# Patient Record
Sex: Male | Born: 1950 | Race: White | Hispanic: No | Marital: Married | Smoking: Never smoker
Health system: Southern US, Community
[De-identification: ages and names within clinical notes are randomized; demographics above are authoritative.]

## PROBLEM LIST (undated history)

## (undated) DIAGNOSIS — N50819 Testicular pain, unspecified: Secondary | ICD-10-CM

## (undated) DIAGNOSIS — I700 Atherosclerosis of aorta: Secondary | ICD-10-CM

## (undated) DIAGNOSIS — N529 Male erectile dysfunction, unspecified: Secondary | ICD-10-CM

## (undated) DIAGNOSIS — I779 Disorder of arteries and arterioles, unspecified: Secondary | ICD-10-CM

## (undated) DIAGNOSIS — L719 Rosacea, unspecified: Secondary | ICD-10-CM

## (undated) DIAGNOSIS — I2510 Atherosclerotic heart disease of native coronary artery without angina pectoris: Secondary | ICD-10-CM

## (undated) DIAGNOSIS — C679 Malignant neoplasm of bladder, unspecified: Secondary | ICD-10-CM

## (undated) DIAGNOSIS — R55 Syncope and collapse: Secondary | ICD-10-CM

## (undated) DIAGNOSIS — I4891 Unspecified atrial fibrillation: Secondary | ICD-10-CM

## (undated) DIAGNOSIS — I10 Essential (primary) hypertension: Secondary | ICD-10-CM

## (undated) DIAGNOSIS — E7800 Pure hypercholesterolemia, unspecified: Secondary | ICD-10-CM

## (undated) DIAGNOSIS — N400 Enlarged prostate without lower urinary tract symptoms: Secondary | ICD-10-CM

## (undated) DIAGNOSIS — I240 Acute coronary thrombosis not resulting in myocardial infarction: Secondary | ICD-10-CM

## (undated) DIAGNOSIS — Z8719 Personal history of other diseases of the digestive system: Secondary | ICD-10-CM

## (undated) DIAGNOSIS — C662 Malignant neoplasm of left ureter: Secondary | ICD-10-CM

## (undated) DIAGNOSIS — Z87442 Personal history of urinary calculi: Secondary | ICD-10-CM

## (undated) DIAGNOSIS — I4892 Unspecified atrial flutter: Secondary | ICD-10-CM

## (undated) DIAGNOSIS — G4733 Obstructive sleep apnea (adult) (pediatric): Secondary | ICD-10-CM

## (undated) DIAGNOSIS — R251 Tremor, unspecified: Secondary | ICD-10-CM

## (undated) DIAGNOSIS — C801 Malignant (primary) neoplasm, unspecified: Secondary | ICD-10-CM

## (undated) DIAGNOSIS — J3089 Other allergic rhinitis: Secondary | ICD-10-CM

## (undated) DIAGNOSIS — R001 Bradycardia, unspecified: Secondary | ICD-10-CM

## (undated) DIAGNOSIS — Z7982 Long term (current) use of aspirin: Secondary | ICD-10-CM

## (undated) DIAGNOSIS — G4730 Sleep apnea, unspecified: Secondary | ICD-10-CM

## (undated) DIAGNOSIS — I499 Cardiac arrhythmia, unspecified: Secondary | ICD-10-CM

## (undated) DIAGNOSIS — Z9889 Other specified postprocedural states: Secondary | ICD-10-CM

## (undated) HISTORY — PX: HERNIA REPAIR: SHX51

## (undated) HISTORY — DX: Testicular pain, unspecified: N50.819

## (undated) HISTORY — PX: VASECTOMY: SHX75

## (undated) HISTORY — DX: Sleep apnea, unspecified: G47.30

## (undated) HISTORY — PX: OTHER SURGICAL HISTORY: SHX169

## (undated) HISTORY — DX: Unspecified atrial fibrillation: I48.91

## (undated) HISTORY — PX: CARDIAC CATHETERIZATION: SHX172

## (undated) HISTORY — PX: COLONOSCOPY: SHX174

## (undated) HISTORY — PX: CARDIOVERSION: EP1203

## (undated) HISTORY — PX: TONSILLECTOMY: SUR1361

## (undated) HISTORY — PX: APPENDECTOMY: SHX54

## (undated) HISTORY — PX: FACIAL RECONSTRUCTION SURGERY: SHX631

---
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
## 1993-01-13 HISTORY — PX: INGUINAL HERNIA REPAIR: SHX194

## 2008-05-15 ENCOUNTER — Ambulatory Visit: Payer: Self-pay | Admitting: Gastroenterology

## 2009-06-20 ENCOUNTER — Ambulatory Visit: Payer: Self-pay | Admitting: Cardiology

## 2009-06-20 HISTORY — PX: CARDIOVERSION: EP1203

## 2010-02-13 ENCOUNTER — Ambulatory Visit: Payer: Self-pay | Admitting: Cardiology

## 2010-02-13 HISTORY — PX: LEFT HEART CATH AND CORONARY ANGIOGRAPHY: CATH118249

## 2012-01-14 HISTORY — PX: AV NODE ABLATION: SHX1209

## 2012-11-01 ENCOUNTER — Ambulatory Visit: Payer: Self-pay | Admitting: Cardiology

## 2012-11-18 DIAGNOSIS — I240 Acute coronary thrombosis not resulting in myocardial infarction: Secondary | ICD-10-CM | POA: Insufficient documentation

## 2012-11-18 DIAGNOSIS — I4892 Unspecified atrial flutter: Secondary | ICD-10-CM | POA: Insufficient documentation

## 2012-11-19 DIAGNOSIS — I4891 Unspecified atrial fibrillation: Secondary | ICD-10-CM | POA: Insufficient documentation

## 2012-11-19 DIAGNOSIS — I2510 Atherosclerotic heart disease of native coronary artery without angina pectoris: Secondary | ICD-10-CM | POA: Insufficient documentation

## 2013-01-07 HISTORY — PX: CARDIAC ELECTROPHYSIOLOGY STUDY AND ABLATION: SHX1294

## 2013-11-30 DIAGNOSIS — Z8679 Personal history of other diseases of the circulatory system: Secondary | ICD-10-CM | POA: Insufficient documentation

## 2013-11-30 DIAGNOSIS — Z9889 Other specified postprocedural states: Secondary | ICD-10-CM

## 2014-05-05 NOTE — Op Note (Signed)
PATIENT NAME:  [name], CROSSAN MR#:  [id number] DATE OF BIRTH:  [birth year]  DATE OF PROCEDURE:  11/01/2012  PRIMARY CARE PHYSICIAN: Dr. Raechel Ache.  DIAGNOSIS: Atrial flutter.   PROCEDURE: Cardioversion.   INDICATION: The patient is a 64 year old gentleman with paroxysmal atrial fibrillation and flutter with recent history of tachycardia noted to be in atrial flutter and is currently anticoagulated on Pradaxa.  PROCEDURE: The patient was brought to the special holding area procedure in a fasting state. The patient was given 2 mg of intravenous Versed  and 70 mg of propofol. Cardioversion was performed with 50 joules, which converted to atrial fibrillation and 100 joules, which converted to sinus rhythm. The patient tolerated the procedure well without complication.   ____________________________ [name], MD ap:aw D: 11/01/2012 08:15:29 ET T: 11/01/2012 08:32:14 ET JOB#: [id number]  cc: [name], MD, <Dictator> [name] MD ELECTRONICALLY SIGNED 12/01/2012 9:31

## 2015-01-14 DIAGNOSIS — Z87442 Personal history of urinary calculi: Secondary | ICD-10-CM

## 2015-01-14 HISTORY — DX: Personal history of urinary calculi: Z87.442

## 2015-09-19 ENCOUNTER — Ambulatory Visit (INDEPENDENT_AMBULATORY_CARE_PROVIDER_SITE_OTHER): Payer: PRIVATE HEALTH INSURANCE | Admitting: Urology

## 2015-09-19 ENCOUNTER — Encounter: Payer: Self-pay | Admitting: Urology

## 2015-09-19 VITALS — BP 117/72 | HR 61 | Ht 67.0 in | Wt 195.8 lb

## 2015-09-19 DIAGNOSIS — N451 Epididymitis: Secondary | ICD-10-CM | POA: Diagnosis not present

## 2015-09-19 DIAGNOSIS — N401 Enlarged prostate with lower urinary tract symptoms: Secondary | ICD-10-CM

## 2015-09-19 DIAGNOSIS — N138 Other obstructive and reflux uropathy: Secondary | ICD-10-CM

## 2015-09-19 NOTE — Progress Notes (Signed)
09/19/2015 10:13 PM   [name] 1950/08/05 [id number]  Referring provider: Ezequiel Kayser, MD [facility] [place], [place] [address]  Chief Complaint  Patient presents with  . Testicle Pain    Left referred by Dr. Dorthula Perfect at [facility]    HPI: Patient is a 65 year old Caucasian male who is referred by Dr. Dorthula Perfect for left testicular pain.  He states that his symptoms began four days ago with a gradual onset.  It began in the left groin and radiated into the left scrotum.  As the days progressed, the pain and swelling worsened.  He did not have fevers, chills, nausea or vomiting associated with the scrotal issue.  He did have fevers and chills the previous weekend.  He did not have any trauma to the area.    He has constipation.    He was seen by his PCP, yesterday, and started on Cipro 500 mg for two weeks.  His UA yesterday was positive for 4-10 RBC's/hpf.  An urine culture is pending at this time.    He did not admit to sexual indiscretions, but he did present with his wife.  Of interest, his wife did request that he be screened for STI's at the end of the visit.  This was asked with the patient present and he agreed to the testing.    He did not admit to penile discharge or urethral burning.  He has been having urinary frequency for over the last year.  It has become especially worse over the last month.  He has also been experiencing urgency and nocturia 4 to 5.  He does not have feelings of incomplete bladder emptying.    His states he has PSA screening through his employer.  He does not recall the value of his PSA's at this time.    He had seen an urologist in the remote past for a vasectomy.  He also remembers having a cystoscopy, but he does not recall the reason this was performed.  He does remember he was told everything "looked good."   PMH: Past Medical History:  Diagnosis Date  . Atrial fibrillation ([place])   . Heart disease   . Sleep  apnea   . Testicle pain     Surgical History: Past Surgical History:  Procedure Laterality Date  . APPENDECTOMY    . heart ablation    . HERNIA REPAIR    . VASECTOMY      Home Medications:    Medication List       Accurate as of 09/19/15 10:13 PM. Always use your most recent med list.          aspirin EC 81 MG tablet Take by mouth.   ciprofloxacin 500 MG tablet Commonly known as:  CIPRO Take by mouth.   fexofenadine 180 MG tablet Commonly known as:  ALLEGRA Take by mouth.   HYDROcodone-acetaminophen 5-325 MG tablet Commonly known as:  NORCO/VICODIN Take by mouth.   Loratadine 10 MG Caps Take by mouth.   lovastatin 20 MG tablet Commonly known as:  MEVACOR TAKE TWO TABLETS BY MOUTH ONCE DAILY FOR CHOLESTEROL   metoprolol tartrate 25 MG tablet Commonly known as:  LOPRESSOR Take by mouth.   MULTI-VITAMINS Tabs Take by mouth.       Allergies: No Known Allergies  Family History: Family History  Problem Relation Age of Onset  . Nephrotic syndrome Son   . Prostate cancer Neg Hx  Social History:  reports that he has never smoked. His smokeless tobacco use includes Chew. He reports that he drinks alcohol. He reports that he does not use drugs.  ROS: UROLOGY Frequent Urination?: Yes Hard to postpone urination?: Yes Burning/pain with urination?: No Get up at night to urinate?: Yes Leakage of urine?: No Urine stream starts and stops?: No Trouble starting stream?: No Do you have to strain to urinate?: No Blood in urine?: No Urinary tract infection?: No Sexually transmitted disease?: No Injury to kidneys or bladder?: No Painful intercourse?: No Weak stream?: No Erection problems?: Yes Penile pain?: No  Gastrointestinal Nausea?: No Vomiting?: No Indigestion/heartburn?: No Diarrhea?: No Constipation?: Yes  Constitutional Fever: Yes Night sweats?: Yes Weight loss?: Yes Fatigue?: Yes  Skin Skin rash/lesions?: Yes Itching?:  Yes  Eyes Blurred vision?: Yes Double vision?: No  Ears/Nose/Throat Sore throat?: No Sinus problems?: Yes  Hematologic/Lymphatic Swollen glands?: No Easy bruising?: Yes  Cardiovascular Leg swelling?: Yes Chest pain?: No  Respiratory Cough?: Yes Shortness of breath?: Yes  Endocrine Excessive thirst?: No  Musculoskeletal Back pain?: No Joint pain?: No  Neurological Headaches?: No Dizziness?: No  Psychologic Depression?: No Anxiety?: No  Physical Exam: BP 117/72   Pulse 61   Ht 5\' 7"  (1.702 m)   Wt 195 lb 12.8 oz (88.8 kg)   BMI 30.67 kg/m   Constitutional: Well nourished. Alert and oriented, No acute distress. HEENT: [place] AT, moist mucus membranes. Trachea midline, no masses. Cardiovascular: No clubbing, cyanosis, or edema. Respiratory: Normal respiratory effort, no increased work of breathing. GI: Abdomen is soft, non tender, non distended, no abdominal masses. Liver and spleen not palpable.  No hernias appreciated.  Stool sample for occult testing is not indicated.   GU: No CVA tenderness.  No bladder fullness or masses.  Patient with circumcised phallus.  Urethral meatus is patent.  No penile discharge. No penile lesions or rashes. Scrotum without lesions, cysts, rashes and/or edema.  Testicles are located scrotally bilaterally. No masses are appreciated in the testicles. Right epididymis is normal.  Left epidiymis is indurated and tender.   Rectal: Deferred Skin: No rashes, bruises or suspicious lesions. Lymph: No cervical or inguinal adenopathy. Neurologic: Grossly intact, no focal deficits, moving all 4 extremities. Psychiatric: Normal mood and affect.   Assessment & Plan:    1. Left epididymitis  - continue the Cipro  - will contact the patient with urine culture results when available   - RTC in 2 weeks for exam and symptom recheck  2. BPH with LUTS  - patient will bring copies of his previous PSA's  - when patient returns in 2 weeks will  contact a prostate exam  - will obtain IPSS score at his next visit   Return in about 2 weeks (around 10/03/2015) for exam, IPSS and synptom recheck.  These notes generated with voice recognition software. I apologize for typographical errors.  Zara Council, [facility] [address] [place],  [address] [phone]

## 2015-09-20 ENCOUNTER — Telehealth: Payer: Self-pay | Admitting: Urology

## 2015-09-20 ENCOUNTER — Other Ambulatory Visit: Payer: PRIVATE HEALTH INSURANCE

## 2015-09-20 DIAGNOSIS — N401 Enlarged prostate with lower urinary tract symptoms: Principal | ICD-10-CM

## 2015-09-20 DIAGNOSIS — N138 Other obstructive and reflux uropathy: Secondary | ICD-10-CM

## 2015-09-20 NOTE — Telephone Encounter (Signed)
Orders placed for gc/chlamydia per Larene Beach.

## 2015-09-20 NOTE — Telephone Encounter (Signed)
Pt's wife, Malachy Mood, brought urine sample in today.  Pt had appt w/Shannon yesterday.  She would like for you to call her with results.

## 2015-09-21 ENCOUNTER — Telehealth: Payer: Self-pay | Admitting: Urology

## 2015-09-21 NOTE — Telephone Encounter (Signed)
Would you call the patient and/or his wife and let them know that the cultures for GC/Chlamydia are not available yet?  I will continue to look for them during the weekend and if I need to switch the antibiotic, I will send it to the pharmacy.  His MyChart is still pending.  If they get that set up, I can send them a message with the culture results and they can see them for themselves.

## 2015-09-21 NOTE — Progress Notes (Signed)
Would you call the patient and/or his wife and let them know that the cultures for GC/Chlamydia are not available yet?  I will continue to look for them during the weekend and if I need to switch the antibiotic, I will send it to the pharmacy.  His MyChart is still pending.  If they get that set up, I can send them a message with the culture results and they can see them for themselves.

## 2015-09-21 NOTE — Telephone Encounter (Signed)
Pt called office to find out test results.  I read message to him and he understands.  I also advised him to set up MyChart and confirmed e-mail and told him to follow directions with code to activate.

## 2015-09-22 LAB — GC/CHLAMYDIA PROBE AMP
CHLAMYDIA, DNA PROBE: NEGATIVE
Neisseria gonorrhoeae by PCR: NEGATIVE

## 2015-09-24 ENCOUNTER — Telehealth: Payer: Self-pay | Admitting: Family Medicine

## 2015-09-24 NOTE — Telephone Encounter (Signed)
Patient notified. I spoke to Lab at [facility], they did not order a urine culture on patient. They only did a urinalysis, this can be seen in Care everywhere.

## 2015-09-24 NOTE — Telephone Encounter (Signed)
-----   Message from Nori Riis, PA-C sent at 09/22/2015  6:27 PM EDT ----- Please notify the patient that his chlamydia and gonorrhea cultures were negative.  I have not seen the results of the urine culture from Dr. Dorthula Perfect' office.  Would you call and see if we can get those results?

## 2015-10-04 ENCOUNTER — Ambulatory Visit: Payer: Self-pay | Admitting: Urology

## 2015-10-04 ENCOUNTER — Ambulatory Visit (INDEPENDENT_AMBULATORY_CARE_PROVIDER_SITE_OTHER): Payer: PRIVATE HEALTH INSURANCE | Admitting: Urology

## 2015-10-04 ENCOUNTER — Encounter: Payer: Self-pay | Admitting: Urology

## 2015-10-04 VITALS — BP 110/64 | HR 56 | Ht 70.0 in | Wt 197.8 lb

## 2015-10-04 DIAGNOSIS — N451 Epididymitis: Secondary | ICD-10-CM | POA: Diagnosis not present

## 2015-10-04 DIAGNOSIS — N401 Enlarged prostate with lower urinary tract symptoms: Secondary | ICD-10-CM

## 2015-10-04 DIAGNOSIS — N138 Other obstructive and reflux uropathy: Secondary | ICD-10-CM

## 2015-10-04 DIAGNOSIS — R3129 Other microscopic hematuria: Secondary | ICD-10-CM

## 2015-10-04 LAB — URINALYSIS, COMPLETE
Bilirubin, UA: NEGATIVE
GLUCOSE, UA: NEGATIVE
Ketones, UA: NEGATIVE
Leukocytes, UA: NEGATIVE
NITRITE UA: NEGATIVE
Protein, UA: NEGATIVE
Specific Gravity, UA: >1.03 — ABNORMAL HIGH (ref 1.005–1.030)
Urobilinogen, Ur: 0.2 mg/dL (ref 0.2–1.0)
pH, UA: 5.5 (ref 5.0–7.5)

## 2015-10-04 LAB — MICROSCOPIC EXAMINATION
BACTERIA UA: NONE SEEN
EPITHELIAL CELLS (NON RENAL): NONE SEEN /HPF (ref 0–10)

## 2015-10-04 MED ORDER — CIPROFLOXACIN HCL 500 MG PO TABS: 500.0000 mg | ORAL_TABLET | Freq: Two times a day (BID) | ORAL | 0 refills | Status: DC

## 2015-10-04 NOTE — Progress Notes (Signed)
10/04/2015 9:23 AM   [name] 12/21/1950 [id number]  Referring provider: Ezequiel Kayser, MD [facility] [place], [place] [address]  Chief Complaint  Patient presents with  . Follow-up    2 week Epididymitis    HPI: Patient is a 65 year old Caucasian male who presents today for his two week follow up for a recheck on epididymitis.   Background history Patient was referred by Dr. Dorthula Perfect for left testicular pain.  He stated that his symptoms began four days ago with a gradual onset.  It began in the left groin and radiated into the left scrotum.  As the days progressed, the pain and swelling worsened.  He did not have fevers, chills, nausea or vomiting associated with the scrotal issue.  He did have fevers and chills the previous weekend.  He did not have any trauma to the area.  He has constipation.  He was seen by his PCP and started on Cipro 500 mg for two weeks.  His UA yesterday was positive for 4-10 RBC's/hpf.  An urine culture was pending.  He did not admit to sexual indiscretions, but he did present with his wife.  Of interest, his wife did request that he be screened for STI's at the end of the visit.  This was asked with the patient present and he agreed to the testing.  He did not admit to penile discharge or urethral burning.    He has been having urinary frequency for over the last year.  It has become especially worse over the last month.  He has also been experiencing urgency and nocturia 4 to 5.  He does not have feelings of incomplete bladder emptying.    His IPSS score today was 9/3.        IPSS    Row Name 10/04/15 0800         International Prostate Symptom Score   How often have you had the sensation of not emptying your bladder? Not at All     How often have you had to urinate less than every two hours? Less than half the time     How often have you found you stopped and started again several times when you urinated? Not at All       How often have you found it difficult to postpone urination? About half the time     How often have you had a weak urinary stream? Less than half the time     How often have you had to strain to start urination? Not at All     How many times did you typically get up at night to urinate? 2 Times     Total IPSS Score 9       Quality of Life due to urinary symptoms   If you were to spend the rest of your life with your urinary condition just the way it is now how would you feel about that? Mixed        Score:  1-7 Mild 8-19 Moderate 20-35 Severe  His states he has PSA screening through his employer.  He does not recall the value of his PSA's at this time.  He will e-mail the results.    He had seen an urologist in the remote past for a vasectomy.  He also remembers having a cystoscopy, but he does not recall the reason this was performed.  He does remember he was told everything "looked  good."   PMH: Past Medical History:  Diagnosis Date  . Atrial fibrillation ([place])   . Heart disease   . Sleep apnea   . Testicle pain     Surgical History: Past Surgical History:  Procedure Laterality Date  . APPENDECTOMY    . heart ablation    . HERNIA REPAIR    . VASECTOMY      Home Medications:    Medication List       Accurate as of 10/04/15  9:23 AM. Always use your most recent med list.          aspirin EC 81 MG tablet Take by mouth.   ciprofloxacin 500 MG tablet Commonly known as:  CIPRO Take 1 tablet (500 mg total) by mouth every 12 (twelve) hours.   fexofenadine 180 MG tablet Commonly known as:  ALLEGRA Take by mouth.   HYDROcodone-acetaminophen 5-325 MG tablet Commonly known as:  NORCO/VICODIN Take by mouth.   Loratadine 10 MG Caps Take by mouth.   lovastatin 20 MG tablet Commonly known as:  MEVACOR TAKE TWO TABLETS BY MOUTH ONCE DAILY FOR CHOLESTEROL   metoprolol tartrate 25 MG tablet Commonly known as:  LOPRESSOR Take by mouth.   MULTI-VITAMINS  Tabs Take by mouth.       Allergies: No Known Allergies  Family History: Family History  Problem Relation Age of Onset  . Nephrotic syndrome Son   . Prostate cancer Neg Hx     Social History:  reports that he has never smoked. His smokeless tobacco use includes Chew. He reports that he drinks alcohol. He reports that he does not use drugs.  ROS: UROLOGY Frequent Urination?: Yes Hard to postpone urination?: Yes Burning/pain with urination?: No Get up at night to urinate?: Yes Leakage of urine?: No Urine stream starts and stops?: No Trouble starting stream?: No Do you have to strain to urinate?: No Blood in urine?: No Urinary tract infection?: No Sexually transmitted disease?: No Injury to kidneys or bladder?: No Painful intercourse?: No Weak stream?: No Erection problems?: No Penile pain?: No  Gastrointestinal Nausea?: No Vomiting?: No Indigestion/heartburn?: No Diarrhea?: No Constipation?: No  Constitutional Fever: No Night sweats?: No Weight loss?: No Fatigue?: No  Skin Skin rash/lesions?: No Itching?: No  Eyes Blurred vision?: No Double vision?: No  Ears/Nose/Throat Sore throat?: No Sinus problems?: No  Hematologic/Lymphatic Swollen glands?: No Easy bruising?: No  Cardiovascular Leg swelling?: No Chest pain?: No  Respiratory Cough?: No Shortness of breath?: No  Endocrine Excessive thirst?: No  Musculoskeletal Back pain?: No Joint pain?: No  Neurological Headaches?: No Dizziness?: No  Psychologic Depression?: No Anxiety?: No  Physical Exam: BP 110/64   Pulse (!) 56   Ht 5\' 10"  (1.778 m)   Wt 197 lb 12.8 oz (89.7 kg)   BMI 28.38 kg/m   Constitutional: Well nourished. Alert and oriented, No acute distress. HEENT: [place] AT, moist mucus membranes. Trachea midline, no masses. Cardiovascular: No clubbing, cyanosis, or edema. Respiratory: Normal respiratory effort, no increased work of breathing. GI: Abdomen is soft, non  tender, non distended, no abdominal masses. Liver and spleen not palpable.  No hernias appreciated.  Stool sample for occult testing is not indicated.   GU: No CVA tenderness.  No bladder fullness or masses.  Patient with circumcised phallus.  Urethral meatus is patent.  No penile discharge. No penile lesions or rashes. Scrotum without lesions, cysts, rashes and/or edema.  Testicles are located scrotally bilaterally. No masses are appreciated in the testicles. Right  epididymis is normal.  Left epidiymis is still tender.   Rectal: Patient with  normal sphincter tone. Anus and perineum without scarring or rashes. No rectal masses are appreciated. Prostate is approximately 55 grams, no nodules are appreciated. Seminal vesicles are normal. Skin: No rashes, bruises or suspicious lesions. Lymph: No cervical or inguinal adenopathy. Neurologic: Grossly intact, no focal deficits, moving all 4 extremities. Psychiatric: Normal mood and affect.  Urinalysis 3-10 RBC's/hpf.  See EPIC.   Assessment & Plan:    1. Left epididymitis  - continue the Cipro  - RTC in 2 weeks for exam and symptom recheck  2. Microscopic hematuria  - 3-10 RBC's/hpf on today's UA  - recheck in two weeks  3. BPH with LUTS  - IPSS score is 9/3  - Continue conservative management, avoiding bladder irritants and timed voiding's  - patient will e-mail me copies of his previous PSA's  - will address when patient returns in 2 weeks   Return in about 2 weeks (around 10/18/2015) for recheck.  These notes generated with voice recognition software. I apologize for typographical errors.  Zara Council, [facility] [address], [place], [place] [address] [phone]

## 2015-10-18 ENCOUNTER — Other Ambulatory Visit: Payer: Self-pay

## 2015-10-18 ENCOUNTER — Ambulatory Visit (INDEPENDENT_AMBULATORY_CARE_PROVIDER_SITE_OTHER): Payer: PRIVATE HEALTH INSURANCE | Admitting: Urology

## 2015-10-18 ENCOUNTER — Encounter: Payer: Self-pay | Admitting: Urology

## 2015-10-18 VITALS — BP 114/68 | HR 57 | Ht 70.0 in | Wt 198.3 lb

## 2015-10-18 DIAGNOSIS — N453 Epididymo-orchitis: Secondary | ICD-10-CM

## 2015-10-18 DIAGNOSIS — N400 Enlarged prostate without lower urinary tract symptoms: Secondary | ICD-10-CM | POA: Diagnosis not present

## 2015-10-18 DIAGNOSIS — R3129 Other microscopic hematuria: Secondary | ICD-10-CM | POA: Diagnosis not present

## 2015-10-18 LAB — MICROSCOPIC EXAMINATION: BACTERIA UA: NONE SEEN

## 2015-10-18 LAB — URINALYSIS, COMPLETE
BILIRUBIN UA: NEGATIVE
GLUCOSE, UA: NEGATIVE
KETONES UA: NEGATIVE
Leukocytes, UA: NEGATIVE
NITRITE UA: NEGATIVE
Protein, UA: NEGATIVE
SPEC GRAV UA: 1.025 (ref 1.005–1.030)
UUROB: 0.2 mg/dL (ref 0.2–1.0)
pH, UA: 5.5 (ref 5.0–7.5)

## 2015-10-18 LAB — BLADDER SCAN AMB NON-IMAGING: SCAN RESULT: 48

## 2015-10-18 NOTE — Patient Instructions (Addendum)
Mirabegron extended-release tablets What is this medicine? MIRABEGRON (MIR a BEG ron) is used to treat overactive bladder. This medicine reduces the amount of bathroom visits. It Bonebrake also help to control wetting accidents. This medicine Trost be used for other purposes; ask your health care provider or pharmacist if you have questions. What should I tell my health care provider before I take this medicine? They need to know if you have any of these conditions: -difficulty passing urine -high blood pressure -kidney disease -liver disease -an unusual or allergic reaction to mirabegron, other medicines, foods, dyes, or preservatives -pregnant or trying to get pregnant -breast-feeding How should I use this medicine? Take this medicine by mouth with a glass of water. Follow the directions on the prescription label. Do not cut, crush or chew this medicine. You can take it with or without food. If it upsets your stomach, take it with food. Take your medicine at regular intervals. Do not take it more often than directed. Do not stop taking except on your doctor's advice. Talk to your pediatrician regarding the use of this medicine in children. Special care Jamerson be needed. Overdosage: If you think you have taken too much of this medicine contact a poison control center or emergency room at once. NOTE: This medicine is only for you. Do not share this medicine with others. What if I miss a dose? If you miss a dose, take it as soon as you can. If it is almost time for your next dose, take only that dose. Do not take double or extra doses. What Coccia interact with this medicine? -certain medicines for bladder problems like fesoterodine, oxybutynin, solifenacin, tolterodine -desipramine -digoxin -flecainide -ketoconazole -MAOIs like Carbex, Eldepryl, Marplan, Nardil, and Parnate -metoprolol -propafenone -thioridazine -warfarin This list [place] not describe all possible interactions. Give your health care  provider a list of all the medicines, herbs, non-prescription drugs, or dietary supplements you use. Also tell them if you smoke, drink alcohol, or use illegal drugs. Some items Miotke interact with your medicine. What should I watch for while using this medicine? It Hendel take 8 weeks to notice the full benefit from this medicine. You Cassar need to limit your intake tea, coffee, caffeinated sodas, and alcohol. These drinks Markert make your symptoms worse. Visit your doctor or health care professional for regular checks on your progress. Check your blood pressure as directed. Ask your doctor or health care professional what your blood pressure should be and when you should contact him or her. What side effects Shadoan I notice from receiving this medicine? Side effects that you should report to your doctor or health care professional as soon as possible: -allergic reactions like skin rash, itching or hives, swelling of the face, lips, or tongue -chest pain or palpitations -severe or sudden headache -high blood pressure -fast, irregular heartbeat -redness, blistering, peeling or loosening of the skin, including inside the mouth -signs of infection like fever or chills; cough; sore throat; pain or difficulty passing urine -trouble passing urine or change in the amount of urine Side effects that usually do not require medical attention (Report these to your doctor or health care professional if they continue or are bothersome.): -constipation -diarrhea -dizziness -dry eyes -joint pain -mild headache -nausea -runny nose This list Mundorf not describe all possible side effects. Call your doctor for medical advice about side effects. You Trim report side effects to FDA at 1-800-FDA-1088. Where should I keep my medicine? Keep out of the reach of children. Store   at room temperature between 15 and 30 degrees C (59 and 86 degrees F). Throw away any unused medicine after the expiration date. NOTE: This sheet is a  summary. It may not cover all possible information. If you have questions about this medicine, talk to your doctor, pharmacist, or health care provider.    2016, Elsevier/Gold Standard. (2014-08-31 10:22:20)

## 2015-10-18 NOTE — Progress Notes (Signed)
10/18/2015 9:08 AM   [name] 1950-11-25 [id number]  Referring provider: Ezequiel Kayser, MD [facility] [place], [place] [address]  Chief Complaint  Patient presents with  . Follow-up    Epididymitis left   2 week follow up    HPI: Patient is a 65 year old Caucasian male who presents today for his two week follow up for a recheck on left epididymitis, microscopic hematuria and BPH with LUTS.  Left epididymitis Patient was referred by Dr. Dorthula Perfect for left testicular pain.  When patient presented to [place], he was already prescribed Cipro.  There was some question of infidelity, his GC/Chlamydia cultures were negative.  At his recheck two weeks ago, he was still having some left sided testicular pain.  He did state it was significantly lessened.  His left epididymis was still tender and indurated on exam.  He was placed on two more weeks of Cipro and instructed to follow up.  Today, he states he is still feeling pain in the left testicle.  It is more of a twinge of pain that lasts for a few minutes rather than the longer lasting ache from the weeks before.    Microscopic hematuria Patient was found to have AHM on 10/04/2015 with 3-10 RBC's.  He was also found to have 11-30 RBC's on today's visit.  He denies gross hematuria, dysuria or suprapubic pain.  He has not had any recent fevers, chills, nausea or vomiting.   BPH with LUTS He has been having urinary frequency for over the last year.  It has become especially worse over the last month.  He has also been experiencing urgency and nocturia 4 to 5.  He does not have feelings of incomplete bladder emptying.  His IPSS score  was 9/3.   His PVR is 48 mL.        IPSS    Row Name 10/04/15 0800         International Prostate Symptom Score   How often have you had the sensation of not emptying your bladder? Not at All     How often have you had to urinate less than every two hours? Less than half the time     How often have you found you stopped and started again several times when you urinated? Not at All     How often have you found it difficult to postpone urination? About half the time     How often have you had a weak urinary stream? Less than half the time     How often have you had to strain to start urination? Not at All     How many times did you typically get up at night to urinate? 2 Times     Total IPSS Score 9       Quality of Life due to urinary symptoms   If you were to spend the rest of your life with your urinary condition just the way it is now how would you feel about that? Mixed        Score:  1-7 Mild 8-19 Moderate 20-35 Severe  He had seen an urologist in the remote past for a vasectomy.  He also remembers having a cystoscopy, but he does not recall the reason this was performed.  He does remember he was told everything "looked good."   PMH: Past Medical History:  Diagnosis Date  . Atrial fibrillation ([place])   . Heart disease   .  Sleep apnea   . Testicle pain     Surgical History: Past Surgical History:  Procedure Laterality Date  . APPENDECTOMY    . heart ablation    . HERNIA REPAIR    . VASECTOMY      Home Medications:    Medication List       Accurate as of 10/18/15  9:08 AM. Always use your most recent med list.          aspirin EC 81 MG tablet Take by mouth.   ciprofloxacin 500 MG tablet Commonly known as:  CIPRO Take 1 tablet (500 mg total) by mouth every 12 (twelve) hours.   fexofenadine 180 MG tablet Commonly known as:  ALLEGRA Take by mouth.   HYDROcodone-acetaminophen 5-325 MG tablet Commonly known as:  NORCO/VICODIN Take by mouth.   Loratadine 10 MG Caps Take by mouth.   lovastatin 20 MG tablet Commonly known as:  MEVACOR TAKE TWO TABLETS BY MOUTH ONCE DAILY FOR CHOLESTEROL   metoprolol tartrate 25 MG tablet Commonly known as:  LOPRESSOR Take by mouth.   MULTI-VITAMINS Tabs Take by mouth.       Allergies: No  Known Allergies  Family History: Family History  Problem Relation Age of Onset  . Nephrotic syndrome Son   . Prostate cancer Neg Hx     Social History:  reports that he has never smoked. His smokeless tobacco use includes Chew. He reports that he drinks alcohol. He reports that he does not use drugs.  ROS: UROLOGY Frequent Urination?: Yes Hard to postpone urination?: No Burning/pain with urination?: No Get up at night to urinate?: Yes Leakage of urine?: Yes Urine stream starts and stops?: No Trouble starting stream?: No Do you have to strain to urinate?: No Blood in urine?: No Urinary tract infection?: Yes Sexually transmitted disease?: No Injury to kidneys or bladder?: No Painful intercourse?: No Weak stream?: No Erection problems?: No Penile pain?: No  Gastrointestinal Nausea?: No Vomiting?: No Indigestion/heartburn?: No Diarrhea?: No Constipation?: No  Constitutional Fever: No Night sweats?: No Weight loss?: No Fatigue?: No  Skin Skin rash/lesions?: No Itching?: No  Eyes Blurred vision?: No Double vision?: No  Ears/Nose/Throat Sore throat?: No Sinus problems?: No  Hematologic/Lymphatic Swollen glands?: No Easy bruising?: No  Cardiovascular Leg swelling?: No Chest pain?: No  Respiratory Cough?: No Shortness of breath?: No  Endocrine Excessive thirst?: No  Musculoskeletal Back pain?: No Joint pain?: No  Neurological Headaches?: No Dizziness?: No  Psychologic Depression?: No Anxiety?: No  Physical Exam: BP 114/68   Pulse (!) 57   Ht 5\' 10"  (1.778 m)   Wt 198 lb 4.8 oz (89.9 kg)   BMI 28.45 kg/m   Constitutional: Well nourished. Alert and oriented, No acute distress. HEENT: [place] AT, moist mucus membranes. Trachea midline, no masses. Cardiovascular: No clubbing, cyanosis, or edema. Respiratory: Normal respiratory effort, no increased work of breathing. GI: Abdomen is soft, non tender, non distended, no abdominal masses. Liver  and spleen not palpable.  No hernias appreciated.  Stool sample for occult testing is not indicated.   GU: No CVA tenderness.  No bladder fullness or masses.  Patient with circumcised phallus.  Urethral meatus is patent.  No penile discharge. No penile lesions or rashes. Scrotum without lesions, cysts, rashes and/or edema.  Testicles are located scrotally bilaterally. No masses are appreciated in the testicles. Right epididymis is normal.  Left epidiymis is still tender.   Rectal: Patient with  normal sphincter tone. Anus and perineum without scarring  or rashes. No rectal masses are appreciated. Prostate is approximately 55 grams, no nodules are appreciated. Seminal vesicles are normal. Skin: No rashes, bruises or suspicious lesions. Lymph: No cervical or inguinal adenopathy. Neurologic: Grossly intact, no focal deficits, moving all 4 extremities. Psychiatric: Normal mood and affect.   PSA History  0.8 ng/mL on 02/01/2014  0.9 ng/mL on 01/31/2015  Pertinent Imaging Results for TRINTEN, PLATT (MRN [id number]) as of 10/18/2015 09:14  Ref. Range 10/18/2015 09:05  Scan Result Unknown 48   Urinalysis 11-30 RBC's/hpf.  See EPIC.   Assessment & Plan:    1. Left epididymitis  - obtain a scrotal ultrasound - still having pain at this time  - RTC for report  2. Microscopic hematuria  - 11-30 RBC's/hpf on today's UA  - send urine for culture  3. BPH with LUTS  - IPSS score is 9/3  - Continue conservative management, avoiding bladder irritants and timed voiding's  - patient will e-mail me copies of his previous PSA's-received  - patient started on Myrbetriq 25 mg daily, I have advised the patient of the side effects of Myrbetriq, such as: elevation in BP, urinary retention and/or HA.    Return in about 3 weeks (around 11/08/2015) for scrotal ultrasound report, IPSS and PVR.  These notes generated with voice recognition software. I apologize for typographical errors.  Zara Council,  [facility] [address], [place], [place] [address] [phone]

## 2015-10-21 LAB — CULTURE, URINE COMPREHENSIVE

## 2015-11-05 ENCOUNTER — Ambulatory Visit
Admission: RE | Admit: 2015-11-05 | Discharge: 2015-11-05 | Disposition: A | Discharge status: Home / Self Care | Payer: PRIVATE HEALTH INSURANCE | Source: Ambulatory Visit | Attending: Urology | Admitting: Urology

## 2015-11-05 DIAGNOSIS — N453 Epididymo-orchitis: Secondary | ICD-10-CM

## 2015-11-05 DIAGNOSIS — N433 Hydrocele, unspecified: Secondary | ICD-10-CM | POA: Insufficient documentation

## 2015-11-05 DIAGNOSIS — I861 Scrotal varices: Secondary | ICD-10-CM | POA: Insufficient documentation

## 2015-11-08 ENCOUNTER — Encounter: Payer: Self-pay | Admitting: Urology

## 2015-11-08 ENCOUNTER — Ambulatory Visit (INDEPENDENT_AMBULATORY_CARE_PROVIDER_SITE_OTHER): Payer: PRIVATE HEALTH INSURANCE | Admitting: Urology

## 2015-11-08 VITALS — BP 150/85 | HR 49 | Ht 70.0 in | Wt 200.0 lb

## 2015-11-08 DIAGNOSIS — R3129 Other microscopic hematuria: Secondary | ICD-10-CM | POA: Diagnosis not present

## 2015-11-08 DIAGNOSIS — G4730 Sleep apnea, unspecified: Secondary | ICD-10-CM | POA: Insufficient documentation

## 2015-11-08 DIAGNOSIS — N401 Enlarged prostate with lower urinary tract symptoms: Secondary | ICD-10-CM

## 2015-11-08 DIAGNOSIS — N138 Other obstructive and reflux uropathy: Secondary | ICD-10-CM

## 2015-11-08 DIAGNOSIS — N451 Epididymitis: Secondary | ICD-10-CM | POA: Diagnosis not present

## 2015-11-08 LAB — MICROSCOPIC EXAMINATION: BACTERIA UA: NONE SEEN

## 2015-11-08 LAB — BLADDER SCAN AMB NON-IMAGING

## 2015-11-08 LAB — URINALYSIS, COMPLETE
BILIRUBIN UA: NEGATIVE
Glucose, UA: NEGATIVE
LEUKOCYTES UA: NEGATIVE
Nitrite, UA: NEGATIVE
PH UA: 5.5 (ref 5.0–7.5)
Specific Gravity, UA: >1.03 — ABNORMAL HIGH (ref 1.005–1.030)
Urobilinogen, Ur: 0.2 mg/dL (ref 0.2–1.0)

## 2015-11-08 NOTE — Progress Notes (Signed)
11/08/2015 10:55 AM   [name] June 24, 1950 [id number]  Referring provider: Ezequiel Kayser, MD [facility] [place], [place] [address]  Chief Complaint  Patient presents with  . Benign Prostatic Hypertrophy    3wk w/results    HPI: Patient is a 65 year old Caucasian male who presents today for his two week follow up for a recheck on left epididymitis, microscopic hematuria and BPH with LUTS.  Left epididymitis Patient was referred by Dr. Dorthula Perfect for left testicular pain.  When patient presented to [place], he was already prescribed Cipro.  There was some question of infidelity, his GC/Chlamydia cultures were negative.  At his recheck two weeks ago, he was still having some left sided testicular pain.  He did state it was significantly lessened.  His left epididymis was still tender and indurated on exam.  He was placed on two more weeks of Cipro and instructed to follow up.  He underwent a scrotal ultrasound on 11/05/2015 which noted mild prominence of the left epididymal body and tail without hyperemia. This may be sequela of recent epididymitis. No hyperemia within the epididymitis or testicles. No evidence of torsion.  Mild right hydrocele.  Mild bilateral varicoceles.  Today, he states he is still feeling pain in the left testicle when it is touched or brushed up against.      Microscopic hematuria Patient was found to have AHM on 10/04/2015 with 3-10 RBC's.  He was also found to have 11-30 RBC's on his last visit and today's visit.  His urine culture was 2 weeks ago was negative.  He denies gross hematuria, dysuria or suprapubic pain.  He has not had any recent fevers, chills, nausea or vomiting.   BPH with LUTS He has been having urinary frequency for over the last year.  It has become especially worse over the last month.  He has also been experiencing urgency and nocturia 4 to 5.  He was given samples of Myrbetriq 25 mg daily.  He took one pill and  experiencing heart palpitations.  He does not have feelings of incomplete bladder emptying.   His IPSS was 8/4.  His PVR today was 0 mL.   His previous IPSS score  was 9/3.   His previous PVR is 48 mL.       IPSS    Row Name 10/04/15 0800 11/08/15 0900       International Prostate Symptom Score   How often have you had the sensation of not emptying your bladder? Not at All Not at All    How often have you had to urinate less than every two hours? Less than half the time More than half the time    How often have you found you stopped and started again several times when you urinated? Not at All Not at All    How often have you found it difficult to postpone urination? About half the time Less than 1 in 5 times    How often have you had a weak urinary stream? Less than half the time Less than 1 in 5 times    How often have you had to strain to start urination? Not at All Not at All    How many times did you typically get up at night to urinate? 2 Times 2 Times    Total IPSS Score 9 8      Quality of Life due to urinary symptoms   If you were to  spend the rest of your life with your urinary condition just the way it is now how would you feel about that? Mixed Mostly Disatisfied       Score:  1-7 Mild 8-19 Moderate 20-35 Severe  PMH: Past Medical History:  Diagnosis Date  . Atrial fibrillation ([place])   . Heart disease   . Sleep apnea   . Testicle pain     Surgical History: Past Surgical History:  Procedure Laterality Date  . APPENDECTOMY    . heart ablation    . HERNIA REPAIR    . VASECTOMY      Home Medications:    Medication List       Accurate as of 11/08/15 10:55 AM. Always use your most recent med list.          aspirin EC 81 MG tablet Take by mouth.   fexofenadine 180 MG tablet Commonly known as:  ALLEGRA Take by mouth.   lovastatin 20 MG tablet Commonly known as:  MEVACOR TAKE TWO TABLETS BY MOUTH ONCE DAILY FOR CHOLESTEROL   metoprolol tartrate 25  MG tablet Commonly known as:  LOPRESSOR Take by mouth.   MULTI-VITAMINS Tabs Take by mouth.       Allergies: No Known Allergies  Family History: Family History  Problem Relation Age of Onset  . Nephrotic syndrome Son   . Prostate cancer Neg Hx     Social History:  reports that he has never smoked. His smokeless tobacco use includes Chew. He reports that he drinks alcohol. He reports that he does not use drugs.  ROS: UROLOGY Frequent Urination?: Yes Hard to postpone urination?: No Burning/pain with urination?: No Get up at night to urinate?: Yes Leakage of urine?: No Urine stream starts and stops?: No Trouble starting stream?: No Do you have to strain to urinate?: No Blood in urine?: No Urinary tract infection?: No Sexually transmitted disease?: No Injury to kidneys or bladder?: No Painful intercourse?: No Weak stream?: No Erection problems?: No Penile pain?: No  Gastrointestinal Nausea?: No Vomiting?: No Indigestion/heartburn?: No Diarrhea?: No Constipation?: No  Constitutional Fever: No Night sweats?: No Weight loss?: No Fatigue?: No  Skin Skin rash/lesions?: No Itching?: No  Eyes Blurred vision?: No Double vision?: No  Ears/Nose/Throat Sore throat?: No Sinus problems?: No  Hematologic/Lymphatic Swollen glands?: No Easy bruising?: No  Cardiovascular Leg swelling?: No Chest pain?: No  Respiratory Cough?: No Shortness of breath?: No  Endocrine Excessive thirst?: No  Musculoskeletal Back pain?: No Joint pain?: No  Neurological Headaches?: No Dizziness?: No  Psychologic Depression?: No Anxiety?: No  Physical Exam: BP (!) 150/85   Pulse (!) 49   Ht 5\' 10"  (1.778 m)   Wt 200 lb (90.7 kg)   BMI 28.70 kg/m   Constitutional: Well nourished. Alert and oriented, No acute distress. HEENT: [place] AT, moist mucus membranes. Trachea midline, no masses. Cardiovascular: No clubbing, cyanosis, or edema. Respiratory: Normal respiratory  effort, no increased work of breathing. GI: Abdomen is soft, non tender, non distended, no abdominal masses. Liver and spleen not palpable.  No hernias appreciated.  Stool sample for occult testing is not indicated.   GU: No CVA tenderness.  No bladder fullness or masses.  Patient with circumcised phallus.  Urethral meatus is patent.  No penile discharge. No penile lesions or rashes. Scrotum without lesions, cysts, rashes and/or edema.  Testicles are located scrotally bilaterally. No masses are appreciated in the testicles. Right epididymis is normal.  Left epidiymis is still tender.  Rectal: Patient with  normal sphincter tone. Anus and perineum without scarring or rashes. No rectal masses are appreciated. Prostate is approximately 55 grams, no nodules are appreciated. Seminal vesicles are normal. Skin: No rashes, bruises or suspicious lesions. Lymph: No cervical or inguinal adenopathy. Neurologic: Grossly intact, no focal deficits, moving all 4 extremities. Psychiatric: Normal mood and affect.   PSA History  0.8 ng/mL on 02/01/2014  0.9 ng/mL on 01/31/2015  Pertinent Imaging CLINICAL DATA:  Left testicular pain for 1 month. Improved with antibiotics.  EXAM: SCROTAL ULTRASOUND  DOPPLER ULTRASOUND OF THE TESTICLES  TECHNIQUE: Complete ultrasound examination of the testicles, epididymis, and other scrotal structures was performed. Color and spectral Doppler ultrasound were also utilized to evaluate blood flow to the testicles.  COMPARISON:  None.  FINDINGS: Right testicle  Measurements: 4.9 x 2.8 x 3.6 cm. No mass or microlithiasis visualized.  Left testicle  Measurements: 3.9 x 2.1 x 2.7 cm. No mass. Few scattered calcifications.  Right epididymis:  6 mm cyst in the epididymal head.  Left epididymis: Mild prominence to the a left epididymal body and tail. No hyperemia.  Hydrocele:  Mild right hydrocele.  Varicocele:  Mild bilateral varicoceles.  Pulsed  Doppler interrogation of both testes demonstrates normal low resistance arterial and venous waveforms bilaterally.  IMPRESSION: Mild prominence of the left epididymal body and tail without hyperemia. This may be sequela of recent epididymitis. No hyperemia within the epididymitis or testicles. No evidence of torsion.  Mild right hydrocele.  Mild bilateral varicoceles.   Electronically Signed   By: Rolm Baptise M.D.   On: 11/05/2015 15:42  Urinalysis 11-30 RBC's/hpf.  See EPIC.   Assessment & Plan:    1. Left epididymitis  - scrotal ultrasound was negative for torsion and malignancies   2. Microscopic hematuria  - 11-30 RBC's/hpf on today's UA  - will check another UA when he RTC in 3 weeks, if hematuria persists we will pursue a hematuria workup  3. BPH with LUTS  - IPSS score is 8/3  - Continue conservative management, avoiding bladder irritants and timed voiding's  - patient will e-mail me copies of his previous PSA's-received  - patient given Toviaz 4 mg daily, # 28, samples given  Return in about 3 weeks (around 11/29/2015) for IPSS, UA and PVR.  These notes generated with voice recognition software. I apologize for typographical errors.  Zara Council, [facility] [address], [place], [place] [address] [phone]

## 2015-11-29 ENCOUNTER — Ambulatory Visit (INDEPENDENT_AMBULATORY_CARE_PROVIDER_SITE_OTHER): Payer: PRIVATE HEALTH INSURANCE | Admitting: Urology

## 2015-11-29 ENCOUNTER — Encounter: Payer: Self-pay | Admitting: Urology

## 2015-11-29 VITALS — BP 133/75 | HR 51 | Ht 68.0 in | Wt 199.3 lb

## 2015-11-29 DIAGNOSIS — N451 Epididymitis: Secondary | ICD-10-CM | POA: Diagnosis not present

## 2015-11-29 DIAGNOSIS — R3129 Other microscopic hematuria: Secondary | ICD-10-CM

## 2015-11-29 DIAGNOSIS — N400 Enlarged prostate without lower urinary tract symptoms: Secondary | ICD-10-CM

## 2015-11-29 DIAGNOSIS — N138 Other obstructive and reflux uropathy: Secondary | ICD-10-CM

## 2015-11-29 DIAGNOSIS — N401 Enlarged prostate with lower urinary tract symptoms: Secondary | ICD-10-CM | POA: Diagnosis not present

## 2015-11-29 LAB — URINALYSIS, COMPLETE
Bilirubin, UA: NEGATIVE
Glucose, UA: NEGATIVE
Ketones, UA: NEGATIVE
Leukocytes, UA: NEGATIVE
Nitrite, UA: NEGATIVE
PH UA: 7 (ref 5.0–7.5)
PROTEIN UA: NEGATIVE
Specific Gravity, UA: 1.02 (ref 1.005–1.030)
UUROB: 0.2 mg/dL (ref 0.2–1.0)

## 2015-11-29 LAB — MICROSCOPIC EXAMINATION
BACTERIA UA: NONE SEEN
EPITHELIAL CELLS (NON RENAL): NONE SEEN /HPF (ref 0–10)

## 2015-11-29 LAB — BLADDER SCAN AMB NON-IMAGING: SCAN RESULT: 89

## 2015-11-29 NOTE — Progress Notes (Signed)
11/29/2015 9:56 AM   [name] 1950-06-28 [id number]  Referring provider: Ezequiel Kayser, MD [facility] [place], [place] [address]  Chief Complaint  Patient presents with  . Follow-up    3 weeks epididymitis and BPH    HPI: Patient is a 65 year old Caucasian male who presents today for his three week follow up for a recheck on left epididymitis, microscopic hematuria and BPH with LUTS.  Left epididymitis Patient was referred by Dr. Dorthula Perfect for left testicular pain.  When patient presented to [place], he was already prescribed Cipro.  There was some question of infidelity, his GC/Chlamydia cultures were negative.  At his recheck two weeks ago, he was still having some left sided testicular pain.  He did state it was significantly lessened.  His left epididymis was still tender and indurated on exam.  He was placed on two more weeks of Cipro and instructed to follow up.  He underwent a scrotal ultrasound on 11/05/2015 which noted mild prominence of the left epididymal body and tail without hyperemia. This may be sequela of recent epididymitis. No hyperemia within the epididymitis or testicles. No evidence of torsion.  Mild right hydrocele.  Mild bilateral varicoceles.  Today, he states his pain in the left testicle has greatly diminished.    Microscopic hematuria Patient continues to have persistent microscopic hematuria.  His UA today is positive for 11-30 RBC's/hpf.  His urine culture was negative.  He denies gross hematuria, dysuria or suprapubic pain.  He has not had any recent fevers, chills, nausea or vomiting.   Patient  doesn't have a prior history of microscopic hematuria.   He does not have a prior history of recurrent urinary tract infections, nephrolithiasis, trauma to the genitourinary tract, BPH or malignancies of the genitourinary tract.   He does/does not have a family medical history of nephrolithiasis, malignancies of the genitourinary tract or  hematuria.   Today, he is having symptoms of frequent urination and nocturia.    He is not experiencing any suprapubic pain, abdominal pain or flank pain. He denies any recent fevers, chills, nausea or vomiting.   He has not had any recent imaging studies.   He is not a smoker.   He is not exposed to secondhand smoke.  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   BPH with LUTS His IPSS score today is 8/3.  He has been having urinary frequency for over the last year.  It has become especially worse over the last month.  He has also been experiencing urgency and nocturia 4 to 5.  He was given samples of Toviaz 4 mg daily, but he did not take them.  He does not have feelings of incomplete bladder emptying.   His previous IPSS was 8/4.  His PVR  was 0 mL.   His previous IPSS score  was 9/3.   His previous PVR is 48 mL.       IPSS    Row Name 10/04/15 0800 11/08/15 0900 11/29/15 0900     International Prostate Symptom Score   How often have you had the sensation of not emptying your bladder? Not at All Not at All Not at All   How often have you had to urinate less than every two hours? Less than half the time More than half the time Less than half the time   How often have you found you stopped and started again several times when you urinated? Not  at All Not at All Not at All   How often have you found it difficult to postpone urination? About half the time Less than 1 in 5 times Less than 1 in 5 times   How often have you had a weak urinary stream? Less than half the time Less than 1 in 5 times Less than half the time   How often have you had to strain to start urination? Not at All Not at All Not at All   How many times did you typically get up at night to urinate? 2 Times 2 Times 3 Times   Total IPSS Score 9 8 8      Quality of Life due to urinary symptoms   If you were to spend the rest of your life with your urinary condition just the way it is now how would you feel about that? Mixed Mostly  Disatisfied Mixed      Score:  1-7 Mild 8-19 Moderate 20-35 Severe  PMH: Past Medical History:  Diagnosis Date  . Atrial fibrillation ([place])   . Heart disease   . Sleep apnea   . Testicle pain     Surgical History: Past Surgical History:  Procedure Laterality Date  . APPENDECTOMY    . heart ablation    . HERNIA REPAIR    . VASECTOMY      Home Medications:    Medication List       Accurate as of 11/29/15  9:56 AM. Always use your most recent med list.          aspirin EC 81 MG tablet Take by mouth.   fexofenadine 180 MG tablet Commonly known as:  ALLEGRA Take by mouth.   loratadine 10 MG tablet Commonly known as:  CLARITIN Take 10 mg by mouth daily.   lovastatin 20 MG tablet Commonly known as:  MEVACOR TAKE TWO TABLETS BY MOUTH ONCE DAILY FOR CHOLESTEROL   metoprolol tartrate 25 MG tablet Commonly known as:  LOPRESSOR Take by mouth.   MULTI-VITAMINS Tabs Take by mouth.       Allergies: No Known Allergies  Family History: Family History  Problem Relation Age of Onset  . Nephrotic syndrome Son   . Prostate cancer Neg Hx     Social History:  reports that he has never smoked. His smokeless tobacco use includes Chew. He reports that he drinks alcohol. He reports that he does not use drugs.  ROS: UROLOGY Frequent Urination?: Yes Hard to postpone urination?: No Burning/pain with urination?: No Get up at night to urinate?: Yes Leakage of urine?: No Urine stream starts and stops?: No Trouble starting stream?: No Do you have to strain to urinate?: No Blood in urine?: Yes Urinary tract infection?: No Sexually transmitted disease?: No Injury to kidneys or bladder?: No Painful intercourse?: No Weak stream?: No Erection problems?: No Penile pain?: No  Gastrointestinal Nausea?: No Vomiting?: No Indigestion/heartburn?: No Diarrhea?: No Constipation?: No  Constitutional Fever: No Night sweats?: No Weight loss?: No Fatigue?:  No  Skin Skin rash/lesions?: No Itching?: No  Eyes Blurred vision?: No Double vision?: No  Ears/Nose/Throat Sore throat?: No Sinus problems?: No  Hematologic/Lymphatic Swollen glands?: No Easy bruising?: No  Cardiovascular Leg swelling?: No Chest pain?: No  Respiratory Cough?: No Shortness of breath?: No  Endocrine Excessive thirst?: No  Musculoskeletal Back pain?: No Joint pain?: No  Neurological Headaches?: No Dizziness?: No  Psychologic Depression?: No Anxiety?: No  Physical Exam: BP 133/75   Pulse (!) 51  Ht 5\' 8"  (1.727 m)   Wt 199 lb 4.8 oz (90.4 kg)   BMI 30.30 kg/m   Constitutional: Well nourished. Alert and oriented, No acute distress. HEENT: [place] AT, moist mucus membranes. Trachea midline, no masses. Cardiovascular: No clubbing, cyanosis, or edema. Respiratory: Normal respiratory effort, no increased work of breathing. Skin: No rashes, bruises or suspicious lesions. Lymph: No cervical or inguinal adenopathy. Neurologic: Grossly intact, no focal deficits, moving all 4 extremities. Psychiatric: Normal mood and affect.   PSA History  0.8 ng/mL on 02/01/2014  0.9 ng/mL on 01/31/2015  Pertinent Imaging CLINICAL DATA:  Left testicular pain for 1 month. Improved with antibiotics.  EXAM: SCROTAL ULTRASOUND  DOPPLER ULTRASOUND OF THE TESTICLES  TECHNIQUE: Complete ultrasound examination of the testicles, epididymis, and other scrotal structures was performed. Color and spectral Doppler ultrasound were also utilized to evaluate blood flow to the testicles.  COMPARISON:  None.  FINDINGS: Right testicle  Measurements: 4.9 x 2.8 x 3.6 cm. No mass or microlithiasis visualized.  Left testicle  Measurements: 3.9 x 2.1 x 2.7 cm. No mass. Few scattered calcifications.  Right epididymis:  6 mm cyst in the epididymal head.  Left epididymis: Mild prominence to the a left epididymal body and tail. No hyperemia.  Hydrocele:   Mild right hydrocele.  Varicocele:  Mild bilateral varicoceles.  Pulsed Doppler interrogation of both testes demonstrates normal low resistance arterial and venous waveforms bilaterally.  IMPRESSION: Mild prominence of the left epididymal body and tail without hyperemia. This may be sequela of recent epididymitis. No hyperemia within the epididymitis or testicles. No evidence of torsion.  Mild right hydrocele.  Mild bilateral varicoceles.   Electronically Signed   By: Rolm Baptise M.D.   On: 11/05/2015 15:42  Urinalysis 11-30 RBC's/hpf.  See EPIC.   Assessment & Plan:    1. Left epididymitis  - scrotal ultrasound was negative for torsion and malignancies  - resolved   2. Microscopic hematuria  - 11-30 RBC's/hpf on today's UA  - I explained to the patient that there are a number of causes that can be associated with blood in the urine, such as stones, BPH, UTI's, damage to the urinary tract and/or cancer.  - At this time, I felt that the patient warranted further urologic evaluation.   The AUA guidelines state that a CT urogram is the preferred imaging study to evaluate hematuria.  - I explained to the patient that a contrast material will be injected into a vein and that in rare instances, an allergic reaction can result and may even life threatening   The patient denies any allergies to contrast, iodine and/or seafood and is not taking metformin.  - Following the imaging study,  I've recommended a cystoscopy. I described how this is performed, typically in an office setting with a flexible cystoscope. We described the risks, benefits, and possible side effects, the most common of which is a minor amount of blood in the urine and/or burning which usually resolves in 24 to 48 hours.    - The patient had the opportunity to ask questions which were answered. Based upon this discussion, the patient is willing to proceed. Therefore, I've ordered: a CT Urogram and cystoscopy.  -  The patient will return following all of the above for discussion of the results.     3. BPH with LUTS  - IPSS score is 8/3  - Continue conservative management, avoiding bladder irritants and timed voiding's  - patient will try the Toviaz 4  mg daily, # 28, samples given  Return for CT Urogram report and cystoscopy.  These notes generated with voice recognition software. I apologize for typographical errors.  Zara Council, [facility] [address], [place], [place] [address] [phone]

## 2015-12-11 ENCOUNTER — Other Ambulatory Visit: Payer: Self-pay | Admitting: Urology

## 2015-12-11 ENCOUNTER — Telehealth: Payer: Self-pay | Admitting: Urology

## 2015-12-11 DIAGNOSIS — R3129 Other microscopic hematuria: Secondary | ICD-10-CM

## 2015-12-11 NOTE — Telephone Encounter (Signed)
[facility] called and they need an order for pt who is coming in for labs next week.  Creatinine/Kidney Function

## 2015-12-18 ENCOUNTER — Ambulatory Visit
Admission: RE | Admit: 2015-12-18 | Discharge: 2015-12-18 | Disposition: A | Discharge status: Home / Self Care | Payer: PRIVATE HEALTH INSURANCE | Source: Ambulatory Visit | Attending: Urology | Admitting: Urology

## 2015-12-18 ENCOUNTER — Other Ambulatory Visit
Admission: RE | Admit: 2015-12-18 | Discharge: 2015-12-18 | Disposition: A | Discharge status: Home / Self Care | Payer: PRIVATE HEALTH INSURANCE | Source: Ambulatory Visit | Attending: Urology | Admitting: Urology

## 2015-12-18 DIAGNOSIS — N320 Bladder-neck obstruction: Secondary | ICD-10-CM | POA: Diagnosis not present

## 2015-12-18 DIAGNOSIS — R3129 Other microscopic hematuria: Secondary | ICD-10-CM

## 2015-12-18 DIAGNOSIS — N200 Calculus of kidney: Secondary | ICD-10-CM | POA: Insufficient documentation

## 2015-12-18 DIAGNOSIS — Z0189 Encounter for other specified special examinations: Secondary | ICD-10-CM | POA: Diagnosis present

## 2015-12-18 DIAGNOSIS — N400 Enlarged prostate without lower urinary tract symptoms: Secondary | ICD-10-CM | POA: Insufficient documentation

## 2015-12-18 HISTORY — DX: Essential (primary) hypertension: I10

## 2015-12-18 LAB — CREATININE, SERUM
CREATININE: 0.94 mg/dL (ref 0.61–1.24)
GFR calc Af Amer: >60 mL/min (ref >60)

## 2015-12-18 LAB — BUN: BUN: 17 mg/dL (ref 6–20)

## 2015-12-18 MED ORDER — IOPAMIDOL (ISOVUE-300) INJECTION 61%
125.0000 mL | Freq: Once | INTRAVENOUS | Status: AC | PRN
  Administered 2015-12-18: 125 mL via INTRAVENOUS

## 2015-12-20 ENCOUNTER — Other Ambulatory Visit: Payer: Self-pay

## 2015-12-20 DIAGNOSIS — R3129 Other microscopic hematuria: Secondary | ICD-10-CM

## 2015-12-21 ENCOUNTER — Encounter: Payer: Self-pay | Admitting: Urology

## 2015-12-21 ENCOUNTER — Ambulatory Visit (INDEPENDENT_AMBULATORY_CARE_PROVIDER_SITE_OTHER): Payer: PRIVATE HEALTH INSURANCE | Admitting: Urology

## 2015-12-21 ENCOUNTER — Other Ambulatory Visit
Admission: RE | Admit: 2015-12-21 | Discharge: 2015-12-21 | Disposition: A | Discharge status: Home / Self Care | Payer: PRIVATE HEALTH INSURANCE | Source: Ambulatory Visit | Attending: Urology | Admitting: Urology

## 2015-12-21 VITALS — BP 155/81 | HR 65 | Ht 70.0 in | Wt 199.0 lb

## 2015-12-21 DIAGNOSIS — R3129 Other microscopic hematuria: Secondary | ICD-10-CM

## 2015-12-21 DIAGNOSIS — N200 Calculus of kidney: Secondary | ICD-10-CM

## 2015-12-21 DIAGNOSIS — N138 Other obstructive and reflux uropathy: Secondary | ICD-10-CM

## 2015-12-21 DIAGNOSIS — N401 Enlarged prostate with lower urinary tract symptoms: Secondary | ICD-10-CM

## 2015-12-21 DIAGNOSIS — N329 Bladder disorder, unspecified: Secondary | ICD-10-CM

## 2015-12-21 LAB — URINALYSIS, DIPSTICK ONLY
GLUCOSE, UA: NEGATIVE mg/dL
LEUKOCYTES UA: NEGATIVE
NITRITE: NEGATIVE
PH: 5.5 (ref 5.0–8.0)
Protein, ur: NEGATIVE mg/dL

## 2015-12-21 MED ORDER — CIPROFLOXACIN HCL 500 MG PO TABS
500.0000 mg | ORAL_TABLET | Freq: Once | ORAL | Status: AC
  Administered 2015-12-21: 500 mg via ORAL

## 2015-12-21 MED ORDER — FINASTERIDE 5 MG PO TABS: 5.0000 mg | ORAL_TABLET | Freq: Every day | ORAL | 11 refills | Status: DC

## 2015-12-21 MED ORDER — LIDOCAINE HCL 2 % EX GEL
1.0000 "application " | Freq: Once | CUTANEOUS | Status: AC
  Administered 2015-12-21: 1 via URETHRAL

## 2015-12-21 MED ORDER — TAMSULOSIN HCL 0.4 MG PO CAPS: 0.4000 mg | ORAL_CAPSULE | Freq: Every day | ORAL | 11 refills | Status: DC

## 2015-12-21 NOTE — Progress Notes (Signed)
   12/21/15  CC:  Chief Complaint  Patient presents with  . Cysto    HPI: 65 year old male who presents today for cystoscopy to complete his microscopic hematuria workup. He underwent CT urogram which showed a 3 mm left lower pole stone on 12/18/15, enlarged prostate with diffuse bladder wall thickening (personally reviewed today).  Does have baseline urinary symptoms including urinary frequency, urgency, and significant nocturia.  Most recent PSA 0.9 on 01/31/15, DRE enlarged, 55 cc unremarkable on 10/2015.    Blood pressure (!) 155/81, pulse 65, height 5\' 10"  (1.778 m), weight 199 lb (90.3 kg). NED. A&Ox3.   No respiratory distress   Abd soft, NT, ND Normal phallus with bilateral descended testicles  Cystoscopy Procedure Note  Patient identification was confirmed, informed consent was obtained, and patient was prepped using Betadine solution.  Lidocaine jelly was administered per urethral meatus.    Preoperative abx where received prior to procedure.     Pre-Procedure: - Inspection reveals a normal caliber ureteral meatus.  Procedure: The flexible cystoscope was introduced without difficulty - No urethral strictures/lesions are present. - Enlarged prostate lobar coaptation, 5 cm prostatic length - Elevated bladder neck - Bilateral ureteral orifices identified small solitary frond on trigone just right of midline concerning for a small papilloma versus superficial TCC, approximately 1 mm in size - Bladder mucosa  reveals no additional tumors, ulcerations, or lesions other than above - No bladder stones - Mild trabeculation  Retroflexion shows slight intravesical protrusion of the prostate with small papillary lesion as previously described appreciated on retroflexion.  Post-Procedure: - Patient tolerated the procedure well  Assessment/ Plan:  1. Microscopic hematuria s/p CT urogram and cystoscopy Small intravesical finding as above, 3 mm lower pole stone, and evidence  of bladder outlet obstruction - ciprofloxacin (CIPRO) tablet 500 mg; Take 1 tablet (500 mg total) by mouth once. - lidocaine (XYLOCAINE) 2 % jelly 1 application; Place 1 application into the urethra once.  2. Lesion of bladder Very tiny lesion on trigone but somewhat concerning for neoplasm.  Recommended biopsy in the OR.  Benefits are reviewed in detail. He elected to have this done prior to the nearest possible.  3. BPH with obstruction/lower urinary tract symptoms Insetting of enlarged prostate, mildly trabeculated bladder, an elevated bladder neck, to feel the patient would likely benefit from Flomax and finasteride for his urinary symptoms.  4. Left nephrolithiasis 3 mm LLP stone, asymptomatic No intervention recommended, will follow  [name], MD

## 2015-12-24 ENCOUNTER — Other Ambulatory Visit: Payer: Self-pay | Admitting: Radiology

## 2015-12-24 ENCOUNTER — Telehealth: Payer: Self-pay | Admitting: Radiology

## 2015-12-24 DIAGNOSIS — N329 Bladder disorder, unspecified: Secondary | ICD-10-CM

## 2015-12-24 NOTE — Telephone Encounter (Signed)
-----   Message from [name], MD sent at 12/22/2015  1:27 PM EST ----- Regarding: bladder biopsy  Please schedule this patient for cystoscopy, bladder biopsy.  Ancef 2 g  Preoperative/urine culture, otherwise no additional labs  No need for mitomycin  This procedure will take less than 20 minutes. He would like to have it done before the new year due to deductible issues. I told him we would do our best to get him worked in.  [name], MD

## 2015-12-24 NOTE — Telephone Encounter (Signed)
Notified pt of bladder biopsy scheduled with Dr [name] on 01/02/16, pre-admit testing appt on 12/27/15 @9 :45 & to call day prior to surgery for arrival time to [facility]. Pt voices understanding.

## 2015-12-25 ENCOUNTER — Other Ambulatory Visit: Payer: Self-pay | Admitting: Radiology

## 2015-12-25 NOTE — Telephone Encounter (Signed)
Advised pt to hold ASA 81mg  x 7 days prior to surgery, beginning on 12/26/15, per clearance by Dr Saralyn Pilar. Pt voices understanding.

## 2015-12-27 ENCOUNTER — Encounter
Admission: RE | Admit: 2015-12-27 | Discharge: 2015-12-27 | Disposition: A | Discharge status: Home / Self Care | Payer: PRIVATE HEALTH INSURANCE | Source: Ambulatory Visit | Attending: Urology | Admitting: Urology

## 2015-12-27 DIAGNOSIS — I10 Essential (primary) hypertension: Secondary | ICD-10-CM | POA: Diagnosis not present

## 2015-12-27 DIAGNOSIS — R001 Bradycardia, unspecified: Secondary | ICD-10-CM | POA: Insufficient documentation

## 2015-12-27 DIAGNOSIS — Z01810 Encounter for preprocedural cardiovascular examination: Secondary | ICD-10-CM | POA: Diagnosis present

## 2015-12-27 HISTORY — DX: Cardiac arrhythmia, unspecified: I49.9

## 2015-12-27 NOTE — Patient Instructions (Signed)
  Your procedure is scheduled on: Wed. 01/02/16 Report to Day Surgery. To find out your arrival time please call [phone] between 1PM - 3PM on Tues. 01/01/16.  Remember: Instructions that are not followed completely may result in serious medical risk, up to and including death, or upon the discretion of your surgeon and anesthesiologist your surgery may need to be rescheduled.    __x__ 1. Do not eat food or drink liquids after midnight. No gum chewing or hard candies.     __x__ 2. No Alcohol for 24 hours before or after surgery.   ____ 3. Do Not Smoke For 24 Hours Prior to Your Surgery.   ____ 4. Bring all medications with you on the day of surgery if instructed.    __x__ 5. Notify your doctor if there is any change in your medical condition     (cold, fever, infections).       Do not wear jewelry, make-up, hairpins, clips or nail polish.  Do not wear lotions, powders, or perfumes. You may wear deodorant.  Do not shave 48 hours prior to surgery. Men may shave face and neck.  Do not bring valuables to the hospital.    [facility] is not responsible for any belongings or valuables.               Contacts, dentures or bridgework may not be worn into surgery.  Leave your suitcase in the car. After surgery it may be brought to your room.  For patients admitted to the hospital, discharge time is determined by your                treatment team.   Patients discharged the day of surgery will not be allowed to drive home.   Please read over the following fact sheets that you were given:      __x__ Take these medicines the morning of surgery with A SIP OF WATER:    1.   2. loratadine (CLARITIN) 10 MG tablet  3. metoprolol tartrate (LOPRESSOR) 25 MG tablet  4.  5.  6.  ____ Fleet Enema (as directed)   ____ Use CHG Soap as directed  ____ Use inhalers on the day of surgery  ____ Stop metformin 2 days prior to surgery    ____ Take 1/2 of usual insulin dose the night before  surgery and none on the morning of surgery.   __x__ Stop aspirin already yesterday  ____ Stop Anti-inflammatories on    ____ Stop supplements until after surgery.    __x__ Bring C-Pap to the hospital.

## 2015-12-28 LAB — URINE CULTURE: Culture: NO GROWTH

## 2016-01-02 ENCOUNTER — Encounter
Admission: RE | Disposition: A | Discharge status: Home / Self Care | Payer: Self-pay | Source: Ambulatory Visit | Attending: Urology

## 2016-01-02 ENCOUNTER — Ambulatory Visit: Payer: PRIVATE HEALTH INSURANCE | Admitting: Certified Registered Nurse Anesthetist

## 2016-01-02 ENCOUNTER — Ambulatory Visit
Admission: RE | Admit: 2016-01-02 | Discharge: 2016-01-02 | Disposition: A | Discharge status: Home / Self Care | Payer: PRIVATE HEALTH INSURANCE | Source: Ambulatory Visit | Attending: Urology | Admitting: Urology

## 2016-01-02 DIAGNOSIS — C679 Malignant neoplasm of bladder, unspecified: Secondary | ICD-10-CM | POA: Diagnosis not present

## 2016-01-02 DIAGNOSIS — I10 Essential (primary) hypertension: Secondary | ICD-10-CM | POA: Insufficient documentation

## 2016-01-02 DIAGNOSIS — G4730 Sleep apnea, unspecified: Secondary | ICD-10-CM | POA: Diagnosis not present

## 2016-01-02 DIAGNOSIS — N401 Enlarged prostate with lower urinary tract symptoms: Secondary | ICD-10-CM | POA: Insufficient documentation

## 2016-01-02 DIAGNOSIS — N320 Bladder-neck obstruction: Secondary | ICD-10-CM | POA: Diagnosis not present

## 2016-01-02 DIAGNOSIS — N3080 Other cystitis without hematuria: Secondary | ICD-10-CM | POA: Diagnosis not present

## 2016-01-02 DIAGNOSIS — D494 Neoplasm of unspecified behavior of bladder: Secondary | ICD-10-CM | POA: Diagnosis not present

## 2016-01-02 DIAGNOSIS — N3289 Other specified disorders of bladder: Secondary | ICD-10-CM | POA: Diagnosis present

## 2016-01-02 DIAGNOSIS — I4891 Unspecified atrial fibrillation: Secondary | ICD-10-CM | POA: Diagnosis not present

## 2016-01-02 DIAGNOSIS — N200 Calculus of kidney: Secondary | ICD-10-CM | POA: Diagnosis not present

## 2016-01-02 DIAGNOSIS — N359 Urethral stricture, unspecified: Secondary | ICD-10-CM | POA: Insufficient documentation

## 2016-01-02 DIAGNOSIS — R311 Benign essential microscopic hematuria: Secondary | ICD-10-CM | POA: Diagnosis not present

## 2016-01-02 DIAGNOSIS — N329 Bladder disorder, unspecified: Secondary | ICD-10-CM

## 2016-01-02 HISTORY — PX: CYSTOSCOPY WITH BIOPSY: SHX5122

## 2016-01-02 SURGERY — CYSTOSCOPY, WITH BIOPSY
Anesthesia: General | Site: Bladder | Wound class: Clean Contaminated

## 2016-01-02 MED ORDER — PROMETHAZINE HCL 25 MG/ML IJ SOLN: 6.2500 mg | INTRAMUSCULAR | Status: DC | PRN

## 2016-01-02 MED ORDER — CEFAZOLIN SODIUM-DEXTROSE 2-4 GM/100ML-% IV SOLN
INTRAVENOUS | Status: AC
  Administered 2016-01-02: 2 g via INTRAVENOUS
  Filled 2016-01-02: qty 100

## 2016-01-02 MED ORDER — PROPOFOL 10 MG/ML IV BOLUS
INTRAVENOUS | Status: DC | PRN
  Administered 2016-01-02: 150 mg via INTRAVENOUS

## 2016-01-02 MED ORDER — LACTATED RINGERS IV SOLN
INTRAVENOUS | Status: DC
  Administered 2016-01-02: 14:00:00 via INTRAVENOUS

## 2016-01-02 MED ORDER — PROPOFOL 10 MG/ML IV BOLUS
INTRAVENOUS | Status: AC
  Filled 2016-01-02: qty 20

## 2016-01-02 MED ORDER — LIDOCAINE HCL (CARDIAC) 20 MG/ML IV SOLN
INTRAVENOUS | Status: DC | PRN
  Administered 2016-01-02: 80 mg via INTRAVENOUS

## 2016-01-02 MED ORDER — OXYCODONE HCL 5 MG PO TABS: 5.0000 mg | ORAL_TABLET | Freq: Once | ORAL | Status: DC | PRN

## 2016-01-02 MED ORDER — EPHEDRINE 5 MG/ML INJ
INTRAVENOUS | Status: AC
  Filled 2016-01-02: qty 10

## 2016-01-02 MED ORDER — ACETAMINOPHEN 10 MG/ML IV SOLN
INTRAVENOUS | Status: AC
  Filled 2016-01-02: qty 100

## 2016-01-02 MED ORDER — OXYCODONE HCL 5 MG/5ML PO SOLN: 5.0000 mg | Freq: Once | ORAL | Status: DC | PRN

## 2016-01-02 MED ORDER — MIDAZOLAM HCL 2 MG/2ML IJ SOLN
INTRAMUSCULAR | Status: DC | PRN
  Administered 2016-01-02: 2 mg via INTRAVENOUS

## 2016-01-02 MED ORDER — EPHEDRINE SULFATE 50 MG/ML IJ SOLN
INTRAMUSCULAR | Status: DC | PRN
  Administered 2016-01-02: 10 mg via INTRAVENOUS
  Administered 2016-01-02 (×2): 5 mg via INTRAVENOUS

## 2016-01-02 MED ORDER — FAMOTIDINE 20 MG PO TABS
ORAL_TABLET | ORAL | Status: AC
  Administered 2016-01-02: 20 mg via ORAL
  Filled 2016-01-02: qty 1

## 2016-01-02 MED ORDER — OXYBUTYNIN CHLORIDE 5 MG PO TABS
5.0000 mg | ORAL_TABLET | Freq: Three times a day (TID) | ORAL | Status: DC
  Administered 2016-01-02: 5 mg via ORAL
  Filled 2016-01-02 (×6): qty 1

## 2016-01-02 MED ORDER — FAMOTIDINE 20 MG PO TABS
20.0000 mg | ORAL_TABLET | Freq: Once | ORAL | Status: AC
  Administered 2016-01-02: 20 mg via ORAL

## 2016-01-02 MED ORDER — FENTANYL CITRATE (PF) 100 MCG/2ML IJ SOLN
INTRAMUSCULAR | Status: DC | PRN
  Administered 2016-01-02: 50 ug via INTRAVENOUS

## 2016-01-02 MED ORDER — CEFAZOLIN SODIUM-DEXTROSE 2-4 GM/100ML-% IV SOLN
2.0000 g | INTRAVENOUS | Status: AC
  Administered 2016-01-02: 2 g via INTRAVENOUS

## 2016-01-02 MED ORDER — LIDOCAINE 2% (20 MG/ML) 5 ML SYRINGE
INTRAMUSCULAR | Status: AC
  Filled 2016-01-02: qty 5

## 2016-01-02 MED ORDER — ONDANSETRON HCL 4 MG/2ML IJ SOLN
INTRAMUSCULAR | Status: DC | PRN
  Administered 2016-01-02: 4 mg via INTRAVENOUS

## 2016-01-02 MED ORDER — FENTANYL CITRATE (PF) 100 MCG/2ML IJ SOLN
INTRAMUSCULAR | Status: AC
  Filled 2016-01-02: qty 2

## 2016-01-02 MED ORDER — DEXAMETHASONE SODIUM PHOSPHATE 10 MG/ML IJ SOLN
INTRAMUSCULAR | Status: AC
  Filled 2016-01-02: qty 1

## 2016-01-02 MED ORDER — ONDANSETRON HCL 4 MG/2ML IJ SOLN
INTRAMUSCULAR | Status: AC
  Filled 2016-01-02: qty 2

## 2016-01-02 MED ORDER — ACETAMINOPHEN 10 MG/ML IV SOLN
INTRAVENOUS | Status: DC | PRN
  Administered 2016-01-02: 1000 mg via INTRAVENOUS

## 2016-01-02 MED ORDER — MIDAZOLAM HCL 2 MG/2ML IJ SOLN
INTRAMUSCULAR | Status: AC
  Filled 2016-01-02: qty 2

## 2016-01-02 MED ORDER — DEXAMETHASONE SODIUM PHOSPHATE 10 MG/ML IJ SOLN
INTRAMUSCULAR | Status: DC | PRN
  Administered 2016-01-02: 10 mg via INTRAVENOUS

## 2016-01-02 MED ORDER — FENTANYL CITRATE (PF) 100 MCG/2ML IJ SOLN: 25.0000 ug | INTRAMUSCULAR | Status: DC | PRN

## 2016-01-02 MED ORDER — OXYBUTYNIN CHLORIDE 5 MG PO TABS: 5.0000 mg | ORAL_TABLET | Freq: Three times a day (TID) | ORAL | 0 refills | Status: DC | PRN

## 2016-01-02 MED ORDER — DOCUSATE SODIUM 100 MG PO CAPS: 100.0000 mg | ORAL_CAPSULE | Freq: Two times a day (BID) | ORAL | 0 refills | Status: DC

## 2016-01-02 MED ORDER — MEPERIDINE HCL 25 MG/ML IJ SOLN: 6.2500 mg | INTRAMUSCULAR | Status: DC | PRN

## 2016-01-02 MED ORDER — HYDROCODONE-ACETAMINOPHEN 5-325 MG PO TABS: 1.0000 | ORAL_TABLET | Freq: Four times a day (QID) | ORAL | 0 refills | Status: DC | PRN

## 2016-01-02 SURGICAL SUPPLY — 16 items
BAG DRAIN CYSTO-URO LG1000N (MISCELLANEOUS) ×2 IMPLANT
DRESSING TELFA 4X3 1S ST N-ADH (GAUZE/BANDAGES/DRESSINGS) ×2 IMPLANT
ELECT REM PT RETURN 9FT ADLT (ELECTROSURGICAL) ×2
ELECTRODE REM PT RTRN 9FT ADLT (ELECTROSURGICAL) ×1 IMPLANT
GLOVE BIO SURGEON STRL SZ 6.5 (GLOVE) ×2 IMPLANT
GOWN STRL REUS W/ TWL LRG LVL3 (GOWN DISPOSABLE) ×2 IMPLANT
GOWN STRL REUS W/TWL LRG LVL3 (GOWN DISPOSABLE) ×2
KIT RM TURNOVER CYSTO AR (KITS) ×2 IMPLANT
NDL SAFETY ECLIPSE 18X1.5 (NEEDLE) ×1 IMPLANT
NEEDLE HYPO 18GX1.5 SHARP (NEEDLE) ×1
PACK CYSTO AR (MISCELLANEOUS) ×2 IMPLANT
SCRUB POVIDONE IODINE 4 OZ (MISCELLANEOUS) ×2 IMPLANT
SET CYSTO W/LG BORE CLAMP LF (SET/KITS/TRAYS/PACK) ×2 IMPLANT
SURGILUBE 2OZ TUBE FLIPTOP (MISCELLANEOUS) ×2 IMPLANT
WATER STERILE IRR 1000ML POUR (IV SOLUTION) ×2 IMPLANT
WATER STERILE IRR 3000ML UROMA (IV SOLUTION) ×2 IMPLANT

## 2016-01-02 NOTE — Discharge Instructions (Signed)
Transurethral Resection of Bladder Tumor (TURBT) or Bladder Biopsy ° ° °Definition: ° Transurethral Resection of the Bladder Tumor is a surgical procedure used to diagnose and remove tumors within the bladder. TURBT is the most common treatment for early stage bladder cancer. ° °General instructions: °   ° Your recent bladder surgery requires very little post hospital care but some definite precautions. ° °Despite the fact that no skin incisions were used, the area around the bladder incisions are raw and covered with scabs to promote healing and prevent bleeding. Certain precautions are needed to insure that the scabs are not disturbed over the next 2-4 weeks while the healing proceeds. ° °Because the raw surface inside your bladder and the irritating effects of urine you may expect frequency of urination and/or urgency (a stronger desire to urinate) and perhaps even getting up at night more often. This will usually resolve or improve slowly over the healing period. You may see some blood in your urine over the first 6 weeks. Do not be alarmed, even if the urine was clear for a while. Get off your feet and drink lots of fluids until clearing occurs. If you start to pass clots or don't improve call us. ° °Diet: ° °You may return to your normal diet immediately. Because of the raw surface of your bladder, alcohol, spicy foods, foods high in acid and drinks with caffeine may cause irritation or frequency and should be used in moderation. To keep your urine flowing freely and avoid constipation, drink plenty of fluids during the day (8-10 glasses). Tip: Avoid cranberry juice because it is very acidic. ° °Activity: ° °Your physical activity doesn't need to be restricted. However, if you are very active, you may see some blood in the urine. We suggest that you reduce your activity under the circumstances until the bleeding has stopped. ° °Bowels: ° °It is important to keep your bowels regular during the postoperative  period. Straining with bowel movements can cause bleeding. A bowel movement every other day is reasonable. Use a mild laxative if needed, such as milk of magnesia 2-3 tablespoons, or 2 Dulcolax tablets. Call if you continue to have problems. If you had been taking narcotics for pain, before, during or after your surgery, you may be constipated. Take a laxative if necessary. ° ° ° °Medication: ° °You should resume your pre-surgery medications unless told not to. In addition you may be given an antibiotic to prevent or treat infection. Antibiotics are not always necessary. All medication should be taken as prescribed until the bottles are finished unless you are having an unusual reaction to one of the drugs. ° ° °[facility] °[address], [address] °[place], [place] [address] °[phone] ° ° °AMBULATORY SURGERY  °DISCHARGE INSTRUCTIONS ° ° °1) The drugs that you were given will stay in your system until tomorrow so for the next 24 hours you should not: ° °A) Drive an automobile °B) Make any legal decisions °C) Drink any alcoholic beverage ° ° °2) You may resume regular meals tomorrow.  Today it is better to start with liquids and gradually work up to solid foods. ° °You may eat anything you prefer, but it is better to start with liquids, then soup and crackers, and gradually work up to solid foods. ° ° °3) Please notify your doctor immediately if you have any unusual bleeding, trouble breathing, redness and pain at the surgery site, drainage, fever, or pain not relieved by medication. ° ° ° °4)   Additional Instructions: ° ° ° ° ° ° ° °Please contact your physician with any problems or Same Day Surgery at [phone], Monday through Friday 6 am to 4 pm, or [place] [facility] [place] Main number at [phone]. ° ° ° ° °

## 2016-01-02 NOTE — Op Note (Signed)
Date of procedure: 01/02/16  Preoperative diagnosis:  1. Bladder lesion  2. Microscopic hematuria  Postoperative diagnosis:  1. Bladder lesion  2. Microscopic hematuria  Procedure: 1. Cystoscopy 2. Bladder biopsy   Surgeon: [name], MD  Anesthesia: General  Complications: None  Intraoperative findings: Small approximate 2 mm papillary bladder lesion just adjacent to right UO  EBL: Minimal  Specimens: Bladder lesion  Drains: None  Indication: [name] is a 65 y.o. patient with microscopic hematuria found to have a small papillary bladder lesion just adjacent to right UO, otherwise cystoscopy and CT urogram were unremarkable..  After reviewing the management options for treatment, he elected to proceed with the above surgical procedure(s). We have discussed the potential benefits and risks of the procedure, side effects of the proposed treatment, the likelihood of the patient achieving the goals of the procedure, and any potential problems that might occur during the procedure or recuperation. Informed consent has been obtained.  Description of procedure:  The patient was taken to the operating room and general anesthesia was induced.  The patient was placed in the dorsal lithotomy position, prepped and draped in the usual sterile fashion, and preoperative antibiotics were administered. A preoperative time-out was performed.   A 21 French scope was advanced per urethra into the bladder. 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 The bladder neck was somewhat elevated. The bladder itself was mild to moderately trabeculated. Formal cystoscopy was performed again today which revealed no obvious lesions, tumors, or ulcerations other than a 2 mm papillary lesion just adjacent to the right UO. It had approximately 3 or 4 papillary fronds and was quite small and subtle. Cold cup biopsy forceps were then used  to remove this lesion and the surrounding mucosa. This is passed off the field as bladder lesion. Bugbee electrocautery was then used to achieve complete hemostasis. Care was taken to avoid any fulguration on the actual ureteral orifice. The UO was observed after fulguration to ensure adequate drainage of clear yellow urine. The bladder was then drained and the scope was removed. He was then repositioned the supine position, reversed from anesthesia, taken to the PACU in stable condition.  Plan:  plan to call the patient with his pathology results. If this is a low-grade papillary tumor, we'll perform cystoscopy in 3 months.  [name], MD

## 2016-01-02 NOTE — H&P (View-Only) (Signed)
   12/21/15  CC:  Chief Complaint  Patient presents with  . Cysto    HPI: 65 year old male who presents today for cystoscopy to complete his microscopic hematuria workup. He underwent CT urogram which showed a 3 mm left lower pole stone on 12/18/15, enlarged prostate with diffuse bladder wall thickening (personally reviewed today).  Does have baseline urinary symptoms including urinary frequency, urgency, and significant nocturia.  Most recent PSA 0.9 on 01/31/15, DRE enlarged, 55 cc unremarkable on 10/2015.    Blood pressure (!) 155/81, pulse 65, height 5\' 10"  (1.778 m), weight 199 lb (90.3 kg). NED. A&Ox3.   No respiratory distress   Abd soft, NT, ND Normal phallus with bilateral descended testicles  Cystoscopy Procedure Note  Patient identification was confirmed, informed consent was obtained, and patient was prepped using Betadine solution.  Lidocaine jelly was administered per urethral meatus.    Preoperative abx where received prior to procedure.     Pre-Procedure: - Inspection reveals a normal caliber ureteral meatus.  Procedure: The flexible cystoscope was introduced without difficulty - No urethral strictures/lesions are present. - Enlarged prostate lobar coaptation, 5 cm prostatic length - Elevated bladder neck - Bilateral ureteral orifices identified small solitary frond on trigone just right of midline concerning for a small papilloma versus superficial TCC, approximately 1 mm in size - Bladder mucosa  reveals no additional tumors, ulcerations, or lesions other than above - No bladder stones - Mild trabeculation  Retroflexion shows slight intravesical protrusion of the prostate with small papillary lesion as previously described appreciated on retroflexion.  Post-Procedure: - Patient tolerated the procedure well  Assessment/ Plan:  1. Microscopic hematuria s/p CT urogram and cystoscopy Small intravesical finding as above, 3 mm lower pole stone, and evidence  of bladder outlet obstruction - ciprofloxacin (CIPRO) tablet 500 mg; Take 1 tablet (500 mg total) by mouth once. - lidocaine (XYLOCAINE) 2 % jelly 1 application; Place 1 application into the urethra once.  2. Lesion of bladder Very tiny lesion on trigone but somewhat concerning for neoplasm.  Recommended biopsy in the OR.  Benefits are reviewed in detail. He elected to have this done prior to the nearest possible.  3. BPH with obstruction/lower urinary tract symptoms Insetting of enlarged prostate, mildly trabeculated bladder, an elevated bladder neck, to feel the patient would likely benefit from Flomax and finasteride for his urinary symptoms.  4. Left nephrolithiasis 3 mm LLP stone, asymptomatic No intervention recommended, will follow  [name], MD

## 2016-01-02 NOTE — Anesthesia Preprocedure Evaluation (Signed)
Anesthesia Evaluation  Patient identified by MRN, date of birth, ID band Patient awake    Reviewed: Allergy & Precautions, NPO status , Patient's Chart, lab work & pertinent test results  History of Anesthesia Complications Negative for: history of anesthetic complications  Airway Mallampati: III  TM Distance: >3 FB Neck ROM: Full    Dental  (+) Poor Dentition   Pulmonary sleep apnea and Continuous Positive Airway Pressure Ventilation , neg COPD,    breath sounds clear to auscultation- rhonchi (-) wheezing      Cardiovascular Exercise Tolerance: Good hypertension, Pt. on medications (-) angina(-) Past MI and (-) Cardiac Stents + dysrhythmias (s/p ablation) Atrial Fibrillation  Rhythm:Regular Rate:Normal - Systolic murmurs and - Diastolic murmurs    Neuro/Psych negative neurological ROS  negative psych ROS   GI/Hepatic negative GI ROS, Neg liver ROS,   Endo/Other  negative endocrine ROSneg diabetes  Renal/GU negative Renal ROS     Musculoskeletal negative musculoskeletal ROS (+)   Abdominal (+) - obese,   Peds  Hematology negative hematology ROS (+)   Anesthesia Other Findings Past Medical History: No date: Atrial fibrillation (HCC) No date: Dysrhythmia     Comment: a fib No date: Heart disease No date: Hypertension No date: Sleep apnea No date: Testicle pain   Reproductive/Obstetrics                             Anesthesia Physical Anesthesia Plan  ASA: III  Anesthesia Plan: General   Post-op Pain Management:    Induction: Intravenous  Airway Management Planned: LMA  Additional Equipment:   Intra-op Plan:   Post-operative Plan:   Informed Consent: I have reviewed the patients History and Physical, chart, labs and discussed the procedure including the risks, benefits and alternatives for the proposed anesthesia with the patient or authorized representative who has  indicated his/her understanding and acceptance.   Dental advisory given  Plan Discussed with: CRNA and Anesthesiologist  Anesthesia Plan Comments:         Anesthesia Quick Evaluation

## 2016-01-02 NOTE — Interval H&P Note (Signed)
History and Physical Interval Note:  01/02/2016 3:20 PM  [name]  has presented today for surgery, with the diagnosis of bladder lesion  The various methods of treatment have been discussed with the patient and family. After consideration of risks, benefits and other options for treatment, the patient has consented to  Procedure(s): CYSTOSCOPY WITH BIOPSY (N/A) as a surgical intervention .  The patient's history has been reviewed, patient examined, no change in status, stable for surgery.  I have reviewed the patient's chart and labs.  Questions were answered to the patient's satisfaction.    RRR CTAB  [name]

## 2016-01-02 NOTE — Anesthesia Procedure Notes (Signed)
Procedure Name: LMA Insertion Date/Time: 01/02/2016 3:48 PM Performed by: Johnna Acosta Pre-anesthesia Checklist: Patient identified, Emergency Drugs available, Suction available, Patient being monitored and Timeout performed Patient Re-evaluated:Patient Re-evaluated prior to inductionOxygen Delivery Method: Circle system utilized Preoxygenation: Pre-oxygenation with 100% oxygen Intubation Type: IV induction LMA: LMA inserted LMA Size: 5.0 Tube type: Oral Number of attempts: 1 Placement Confirmation: positive ETCO2 and breath sounds checked- equal and bilateral Tube secured with: Tape Dental Injury: Teeth and Oropharynx as per pre-operative assessment

## 2016-01-02 NOTE — Transfer of Care (Signed)
Immediate Anesthesia Transfer of Care Note  Patient: [name]  Procedure(s) Performed: Procedure(s): CYSTOSCOPY WITH BIOPSY (N/A)  Patient Location: PACU  Anesthesia Type:General  Level of Consciousness: sedated  Airway & Oxygen Therapy: Patient Spontanous Breathing and Patient connected to face mask oxygen  Post-op Assessment: Report given to RN and Post -op Vital signs reviewed and stable  Post vital signs: Reviewed and stable  Last Vitals:  Vitals:   01/02/16 1328 01/02/16 1623  BP: 129/71 123/77  Pulse: (!) 52 (!) 56  Resp: 16 15  Temp: 36.2 C 36.3 C    Last Pain:  Vitals:   01/02/16 1623  TempSrc: Tympanic         Complications: No apparent anesthesia complications

## 2016-01-03 ENCOUNTER — Encounter: Payer: Self-pay | Admitting: Urology

## 2016-01-04 ENCOUNTER — Telehealth: Payer: Self-pay | Admitting: Urology

## 2016-01-04 LAB — SURGICAL PATHOLOGY

## 2016-01-04 NOTE — Anesthesia Postprocedure Evaluation (Signed)
Anesthesia Post Note  Patient: GARSON DEGNAN  Procedure(s) Performed: Procedure(s) (LRB): CYSTOSCOPY WITH BIOPSY (N/A)  Patient location during evaluation: PACU Anesthesia Type: General Level of consciousness: awake and alert Pain management: pain level controlled Vital Signs Assessment: post-procedure vital signs reviewed and stable Respiratory status: spontaneous breathing, nonlabored ventilation, respiratory function stable and patient connected to nasal cannula oxygen Cardiovascular status: blood pressure returned to baseline and stable Postop Assessment: no signs of nausea or vomiting Anesthetic complications: no     Last Vitals:  Vitals:   01/02/16 1705 01/02/16 1728  BP:  139/74  Pulse: (!) 52   Resp: 14 14  Temp:      Last Pain:  Vitals:   01/02/16 1703  TempSrc:   PainSc: 0-No pain                 Martha Clan

## 2016-01-04 NOTE — Telephone Encounter (Signed)
appt made and patient is aware  Sharyn Lull

## 2016-01-04 NOTE — Telephone Encounter (Signed)
Surgical pathology findings were discussed with the patient. Pathology consistent with low-grade, noninvasive bladder cancer.  We discussed that no further intervention will be needed at this time for the cancer. We'll need to follow him closely with serial surveillance cystoscopy. We'll arrange for cystoscopy in 3 months. He understands the importance of this.  [name], MD

## 2016-03-18 ENCOUNTER — Other Ambulatory Visit: Payer: Self-pay

## 2016-04-02 ENCOUNTER — Other Ambulatory Visit: Payer: Self-pay

## 2016-04-02 DIAGNOSIS — C679 Malignant neoplasm of bladder, unspecified: Secondary | ICD-10-CM

## 2016-04-04 ENCOUNTER — Other Ambulatory Visit
Admission: RE | Admit: 2016-04-04 | Discharge: 2016-04-04 | Disposition: A | Discharge status: Home / Self Care | Payer: PRIVATE HEALTH INSURANCE | Source: Ambulatory Visit | Attending: Urology | Admitting: Urology

## 2016-04-04 ENCOUNTER — Encounter: Payer: Self-pay | Admitting: Urology

## 2016-04-04 ENCOUNTER — Ambulatory Visit (INDEPENDENT_AMBULATORY_CARE_PROVIDER_SITE_OTHER): Payer: PRIVATE HEALTH INSURANCE | Admitting: Urology

## 2016-04-04 VITALS — BP 117/69 | HR 56 | Ht 70.0 in | Wt 205.0 lb

## 2016-04-04 DIAGNOSIS — C679 Malignant neoplasm of bladder, unspecified: Secondary | ICD-10-CM | POA: Insufficient documentation

## 2016-04-04 DIAGNOSIS — C670 Malignant neoplasm of trigone of bladder: Secondary | ICD-10-CM | POA: Diagnosis not present

## 2016-04-04 DIAGNOSIS — N200 Calculus of kidney: Secondary | ICD-10-CM

## 2016-04-04 DIAGNOSIS — N400 Enlarged prostate without lower urinary tract symptoms: Secondary | ICD-10-CM

## 2016-04-04 LAB — URINALYSIS, COMPLETE (UACMP) WITH MICROSCOPIC
BACTERIA UA: NONE SEEN
Bilirubin Urine: NEGATIVE
Glucose, UA: NEGATIVE mg/dL
Ketones, ur: NEGATIVE mg/dL
Leukocytes, UA: NEGATIVE
Nitrite: NEGATIVE
PROTEIN: NEGATIVE mg/dL
SPECIFIC GRAVITY, URINE: 1.015 (ref 1.005–1.030)
SQUAMOUS EPITHELIAL / LPF: NONE SEEN
pH: 6 (ref 5.0–8.0)

## 2016-04-04 MED ORDER — CIPROFLOXACIN HCL 500 MG PO TABS
500.0000 mg | ORAL_TABLET | Freq: Once | ORAL | Status: AC
  Administered 2016-04-04: 500 mg via ORAL

## 2016-04-04 MED ORDER — FINASTERIDE 5 MG PO TABS: 5.0000 mg | ORAL_TABLET | Freq: Every day | ORAL | 11 refills | Status: DC

## 2016-04-04 MED ORDER — TAMSULOSIN HCL 0.4 MG PO CAPS: 0.4000 mg | ORAL_CAPSULE | Freq: Every day | ORAL | 11 refills | Status: DC

## 2016-04-04 MED ORDER — LIDOCAINE HCL 2 % EX GEL
1.0000 "application " | Freq: Once | CUTANEOUS | Status: AC
  Administered 2016-04-04: 1 via URETHRAL

## 2016-04-04 NOTE — Progress Notes (Signed)
   04/04/16  CC:  Chief Complaint  Patient presents with  . 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  He returns today for surveillance cystoscopy.  CT urogram which showed a 3 mm left lower pole stone on 12/18/15, enlarged prostate with diffuse bladder wall thickening.  He underwent TURBT on 01/02/2016 which showed a 2 mm papillary lesion just adjacent to the right UO. This was consistent with low-grade TA TCC.  He have baseline urinary symptoms including urinary frequency, urgency, and significant nocturia.  Most recent PSA 0.9 on 01/31/15, DRE enlarged, 55 cc unremarkable on 10/2015.  Last visit, he was started on Flomax and finasteride and his urinary symptoms are improving dramatically. He does still have occasional nocturia but otherwise is improving.  Blood pressure (!) 155/81, pulse 65, height 5\' 10"  (1.778 m), weight 199 lb (90.3 kg). NED. A&Ox3.   No respiratory distress   Abd soft, NT, ND Normal phallus with bilateral descended testicles  Cystoscopy Procedure Note  Patient identification was confirmed, informed consent was obtained, and patient was prepped using Betadine solution.  Lidocaine jelly was administered per urethral meatus.    Preoperative abx where received prior to procedure.     Pre-Procedure: - Inspection reveals a normal caliber ureteral meatus.  Procedure: The flexible cystoscope was introduced without difficulty - No urethral strictures/lesions are present. - Enlarged prostate lobar coaptation, 5 cm prostatic length - Elevated bladder neck - Bilateral ureteral orifices identified - Bladder mucosa  reveals no additional tumors, ulcerations, or lesions other than above - No bladder stones - Mild trabeculation  Retroflexion shows slight intravesical protrusion of the prostate with small papillary lesion as previously described appreciated on retroflexion.  Post-Procedure: - Patient tolerated the procedure  well  Assessment/ Plan:  1.  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 BPH with obstruction/lower urinary tract symptoms Continue Flomax and finasteride - doing well  3. Left nephrolithiasis 3 mm LLP stone, asymptomatic No intervention recommended, will follow  Return in about 6 months (around 10/05/2016) for cysto.  [name], MD

## 2016-04-30 ENCOUNTER — Inpatient Hospital Stay: Admit: 2016-04-30 | Payer: Self-pay

## 2016-10-02 ENCOUNTER — Other Ambulatory Visit: Payer: Self-pay

## 2016-10-02 DIAGNOSIS — C679 Malignant neoplasm of bladder, unspecified: Secondary | ICD-10-CM

## 2016-10-03 ENCOUNTER — Encounter: Payer: Self-pay | Admitting: Urology

## 2016-10-03 ENCOUNTER — Ambulatory Visit (INDEPENDENT_AMBULATORY_CARE_PROVIDER_SITE_OTHER): Payer: Medicare Other | Admitting: Urology

## 2016-10-03 ENCOUNTER — Other Ambulatory Visit
Admission: RE | Admit: 2016-10-03 | Discharge: 2016-10-03 | Disposition: A | Discharge status: Home / Self Care | Payer: Medicare Other | Source: Ambulatory Visit | Attending: Urology | Admitting: Urology

## 2016-10-03 VITALS — BP 115/73 | HR 54 | Ht 70.0 in | Wt 215.0 lb

## 2016-10-03 DIAGNOSIS — C679 Malignant neoplasm of bladder, unspecified: Secondary | ICD-10-CM | POA: Diagnosis not present

## 2016-10-03 LAB — URINALYSIS, COMPLETE (UACMP) WITH MICROSCOPIC
BILIRUBIN URINE: NEGATIVE
Bacteria, UA: NONE SEEN
GLUCOSE, UA: NEGATIVE mg/dL
KETONES UR: NEGATIVE mg/dL
LEUKOCYTES UA: NEGATIVE
NITRITE: NEGATIVE
PH: 6.5 (ref 5.0–8.0)
Protein, ur: NEGATIVE mg/dL
Specific Gravity, Urine: 1.02 (ref 1.005–1.030)
WBC, UA: NONE SEEN WBC/hpf (ref 0–5)

## 2016-10-03 MED ORDER — LIDOCAINE HCL 2 % EX GEL
1.0000 "application " | Freq: Once | CUTANEOUS | Status: AC
  Administered 2016-10-03: 1 via URETHRAL

## 2016-10-03 MED ORDER — CIPROFLOXACIN HCL 500 MG PO TABS
500.0000 mg | ORAL_TABLET | Freq: Once | ORAL | Status: AC
  Administered 2016-10-03: 500 mg via ORAL

## 2016-10-03 NOTE — Progress Notes (Signed)
   10/03/16  CC:  Chief Complaint  Patient presents with  . 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  He returns today for surveillance cystoscopy.  CT urogram which showed a 3 mm left lower pole stone on 12/18/15, enlarged prostate with diffuse bladder wall thickening.  He underwent TURBT on 01/02/2016 which showed a 2 mm papillary lesion just adjacent to the right UO. This was consistent with low-grade TA TCC.  He have baseline urinary symptoms including urinary frequency, urgency, and significant nocturia.  Most recent PSA 0.9 on 01/31/15, DRE enlarged, 55 cc unremarkable on 10/2015.   Started on Flomax and finasteride with well controlled urinary symptoms.    Blood pressure (!) 155/81, pulse 65, height 5\' 10"  (1.778 m), weight 199 lb (90.3 kg). NED. A&Ox3.   No respiratory distress   Abd soft, NT, ND Normal phallus with bilateral descended testicles  Cystoscopy Procedure Note  Patient identification was confirmed, informed consent was obtained, and patient was prepped using Betadine solution.  Lidocaine jelly was administered per urethral meatus.    Preoperative abx where received prior to procedure.     Pre-Procedure: - Inspection reveals a normal caliber ureteral meatus.  Procedure: The flexible cystoscope was introduced without difficulty - No urethral strictures/lesions are present. - Enlarged prostate lobar coaptation, 5 cm prostatic length - Elevated bladder neck - Bilateral ureteral orifices identified - Bladder mucosa  reveals no additional tumors, ulcerations, or lesions other than above - No bladder stones - Mild trabeculation  Retroflexion shows slight intravesical protrusion of the prostate.  Post-Procedure: - Patient tolerated the procedure well  Assessment/ Plan:  1.  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 BPH with obstruction/lower urinary tract symptoms Continue Flomax and finasteride - doing well Will have PSA/ DRE with PCP in Dec or at our next visit  3. Left nephrolithiasis 3 mm LLP stone, asymptomatic No intervention recommended, will follow  Return in about 6 months (around 04/02/2017) for cystoscopy, PSA, DRE.  [name], MD

## 2016-12-13 HISTORY — PX: OTHER SURGICAL HISTORY: SHX169

## 2016-12-30 ENCOUNTER — Other Ambulatory Visit: Payer: Self-pay

## 2016-12-30 ENCOUNTER — Emergency Department: Payer: Medicare Other

## 2016-12-30 ENCOUNTER — Observation Stay
Admission: EM | Admit: 2016-12-30 | Discharge: 2016-12-31 | Disposition: A | Discharge status: Home / Self Care | Payer: Medicare Other | Attending: Internal Medicine | Admitting: Internal Medicine

## 2016-12-30 ENCOUNTER — Observation Stay: Payer: Medicare Other

## 2016-12-30 DIAGNOSIS — I2510 Atherosclerotic heart disease of native coronary artery without angina pectoris: Secondary | ICD-10-CM | POA: Diagnosis not present

## 2016-12-30 DIAGNOSIS — S0101XA Laceration without foreign body of scalp, initial encounter: Secondary | ICD-10-CM | POA: Insufficient documentation

## 2016-12-30 DIAGNOSIS — Z7982 Long term (current) use of aspirin: Secondary | ICD-10-CM | POA: Diagnosis not present

## 2016-12-30 DIAGNOSIS — G4730 Sleep apnea, unspecified: Secondary | ICD-10-CM | POA: Insufficient documentation

## 2016-12-30 DIAGNOSIS — N400 Enlarged prostate without lower urinary tract symptoms: Secondary | ICD-10-CM | POA: Diagnosis not present

## 2016-12-30 DIAGNOSIS — I4891 Unspecified atrial fibrillation: Secondary | ICD-10-CM | POA: Insufficient documentation

## 2016-12-30 DIAGNOSIS — W1830XA Fall on same level, unspecified, initial encounter: Secondary | ICD-10-CM | POA: Diagnosis not present

## 2016-12-30 DIAGNOSIS — Y9289 Other specified places as the place of occurrence of the external cause: Secondary | ICD-10-CM | POA: Insufficient documentation

## 2016-12-30 DIAGNOSIS — I110 Hypertensive heart disease with heart failure: Secondary | ICD-10-CM | POA: Insufficient documentation

## 2016-12-30 DIAGNOSIS — I509 Heart failure, unspecified: Secondary | ICD-10-CM | POA: Insufficient documentation

## 2016-12-30 DIAGNOSIS — R262 Difficulty in walking, not elsewhere classified: Secondary | ICD-10-CM | POA: Insufficient documentation

## 2016-12-30 DIAGNOSIS — R001 Bradycardia, unspecified: Secondary | ICD-10-CM | POA: Diagnosis not present

## 2016-12-30 DIAGNOSIS — Z79899 Other long term (current) drug therapy: Secondary | ICD-10-CM | POA: Diagnosis not present

## 2016-12-30 DIAGNOSIS — E785 Hyperlipidemia, unspecified: Secondary | ICD-10-CM | POA: Diagnosis not present

## 2016-12-30 DIAGNOSIS — Z7901 Long term (current) use of anticoagulants: Secondary | ICD-10-CM | POA: Diagnosis not present

## 2016-12-30 DIAGNOSIS — R55 Syncope and collapse: Secondary | ICD-10-CM | POA: Diagnosis present

## 2016-12-30 HISTORY — DX: Atherosclerotic heart disease of native coronary artery without angina pectoris: I25.10

## 2016-12-30 LAB — TROPONIN I
Troponin I: <0.03 ng/mL (ref <0.03)
Troponin I: <0.03 ng/mL (ref <0.03)

## 2016-12-30 LAB — CBC
HCT: 43.8 % (ref 40.0–52.0)
Hemoglobin: 14.9 g/dL (ref 13.0–18.0)
MCH: 33.5 pg (ref 26.0–34.0)
MCHC: 34 g/dL (ref 32.0–36.0)
MCV: 98.7 fL (ref 80.0–100.0)
PLATELETS: 186 10*3/uL (ref 150–440)
RBC: 4.43 MIL/uL (ref 4.40–5.90)
RDW: 13.7 % (ref 11.5–14.5)
WBC: 9.3 10*3/uL (ref 3.8–10.6)

## 2016-12-30 LAB — BASIC METABOLIC PANEL
Anion gap: 9 (ref 5–15)
BUN: 20 mg/dL (ref 6–20)
CALCIUM: 8.6 mg/dL — AB (ref 8.9–10.3)
CO2: 24 mmol/L (ref 22–32)
Chloride: 103 mmol/L (ref 101–111)
Creatinine, Ser: 0.89 mg/dL (ref 0.61–1.24)
GFR calc non Af Amer: >60 mL/min (ref >60)
GLUCOSE: 136 mg/dL — AB (ref 65–99)
Potassium: 4.3 mmol/L (ref 3.5–5.1)
Sodium: 136 mmol/L (ref 135–145)

## 2016-12-30 LAB — TSH: TSH: 0.7 u[IU]/mL (ref 0.350–4.500)

## 2016-12-30 MED ORDER — ACETAMINOPHEN 650 MG RE SUPP: 650.0000 mg | Freq: Four times a day (QID) | RECTAL | Status: DC | PRN

## 2016-12-30 MED ORDER — ADULT MULTIVITAMIN W/MINERALS CH
1.0000 | ORAL_TABLET | Freq: Every day | ORAL | Status: DC
  Administered 2016-12-31: 1 via ORAL
  Filled 2016-12-30: qty 1

## 2016-12-30 MED ORDER — ENOXAPARIN SODIUM 40 MG/0.4ML ~~LOC~~ SOLN
40.0000 mg | SUBCUTANEOUS | Status: DC
  Administered 2016-12-30: 40 mg via SUBCUTANEOUS
  Filled 2016-12-30: qty 0.4

## 2016-12-30 MED ORDER — SODIUM CHLORIDE 0.9 % IV SOLN
INTRAVENOUS | Status: DC
  Administered 2016-12-30: 23:00:00 via INTRAVENOUS

## 2016-12-30 MED ORDER — ONDANSETRON HCL 4 MG PO TABS: 4.0000 mg | ORAL_TABLET | Freq: Four times a day (QID) | ORAL | Status: DC | PRN

## 2016-12-30 MED ORDER — FINASTERIDE 5 MG PO TABS
5.0000 mg | ORAL_TABLET | Freq: Every day | ORAL | Status: DC
Start: 2016-12-31 — End: 2016-12-31
  Administered 2016-12-31: 5 mg via ORAL
  Filled 2016-12-30: qty 1

## 2016-12-30 MED ORDER — ASPIRIN EC 81 MG PO TBEC
81.0000 mg | DELAYED_RELEASE_TABLET | Freq: Every day | ORAL | Status: DC
  Administered 2016-12-31: 81 mg via ORAL
  Filled 2016-12-30: qty 1

## 2016-12-30 MED ORDER — LIDOCAINE HCL (PF) 1 % IJ SOLN
INTRAMUSCULAR | Status: AC
  Filled 2016-12-30: qty 5

## 2016-12-30 MED ORDER — POLYETHYLENE GLYCOL 3350 17 G PO PACK: 17.0000 g | PACK | Freq: Every day | ORAL | Status: DC | PRN

## 2016-12-30 MED ORDER — METOPROLOL TARTRATE 25 MG PO TABS
12.5000 mg | ORAL_TABLET | Freq: Two times a day (BID) | ORAL | Status: DC
  Filled 2016-12-30 (×2): qty 1

## 2016-12-30 MED ORDER — PRAVASTATIN SODIUM 20 MG PO TABS
20.0000 mg | ORAL_TABLET | Freq: Every day | ORAL | Status: DC
  Administered 2016-12-30: 20 mg via ORAL
  Filled 2016-12-30: qty 1

## 2016-12-30 MED ORDER — LORATADINE 10 MG PO TABS
10.0000 mg | ORAL_TABLET | Freq: Every day | ORAL | Status: DC
  Administered 2016-12-30: 10 mg via ORAL
  Filled 2016-12-30: qty 1

## 2016-12-30 MED ORDER — ONDANSETRON HCL 4 MG/2ML IJ SOLN: 4.0000 mg | Freq: Four times a day (QID) | INTRAMUSCULAR | Status: DC | PRN

## 2016-12-30 MED ORDER — TAMSULOSIN HCL 0.4 MG PO CAPS
0.4000 mg | ORAL_CAPSULE | Freq: Every day | ORAL | Status: DC
Start: 2016-12-31 — End: 2016-12-31
  Administered 2016-12-31: 0.4 mg via ORAL
  Filled 2016-12-30: qty 1

## 2016-12-30 MED ORDER — ACETAMINOPHEN 325 MG PO TABS
650.0000 mg | ORAL_TABLET | Freq: Four times a day (QID) | ORAL | Status: DC | PRN
  Administered 2016-12-30 (×2): 650 mg via ORAL
  Filled 2016-12-30 (×2): qty 2

## 2016-12-30 NOTE — ED Triage Notes (Addendum)
Pt to ER via [facility] after syncope. 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 Laceration to posterior middle/right side of head; bleeding controlled at this time. Pt hx of afib. VSS. CBG 137. 20G to L hand by EMS. Pt alert and oriented X4, active, cooperative, pt in NAD. RR even and unlabored, color WNL.

## 2016-12-30 NOTE — ED Provider Notes (Signed)
[facility] Emergency Department Provider Note   First MD Initiated Contact with Patient 12/30/16 1220     (approximate)  I have reviewed the triage vital signs and the nursing notes.   HISTORY  Chief Complaint Loss of Consciousness   HPI [name] is a 66 y.o. male with below list of chronic conditions presents to the emergency department with witnessed syncopal episode today while at work.  Per the patient's son patient had tremulous activity before falling.  Patient denies any preceding pain but does admit to dizziness before syncopal episode.  Patient denies any chest pain at this time.  Patient denies any shortness of breath weakness numbness or visual changes.   Past Medical History:  Diagnosis Date  . Atrial fibrillation ([place])   . Dysrhythmia    a fib  . Heart disease   . Hypertension   . Sleep apnea   . Testicle pain     Patient Active Problem List   Diagnosis Date Noted  . Sleep apnea 11/08/2015  . S/P ablation of atrial flutter 11/30/2013  . A-fib ([place]) 11/19/2012  . CAD (coronary artery disease) 11/19/2012  . Atrial flutter ([place]) 11/18/2012  . RCA occlusion ([place]) 11/18/2012    Past Surgical History:  Procedure Laterality Date  . APPENDECTOMY    . COLONOSCOPY    . CYSTOSCOPY WITH BIOPSY N/A 01/02/2016   Procedure: CYSTOSCOPY WITH BIOPSY;  Surgeon: [name], MD;  Location: [facility] ORS;  Service: Urology;  Laterality: N/A;  . heart ablation    . HERNIA REPAIR    . TONSILLECTOMY    . VASECTOMY      Prior to Admission medications   Medication Sig Start Date End Date Taking? Authorizing Provider  aspirin EC 81 MG tablet Take 81 mg by mouth daily.     [provider]  docusate sodium (COLACE) 100 MG capsule Take 1 capsule (100 mg total) by mouth 2 (two) times daily. Patient not taking: Reported on 10/03/2016 01/02/16   [name], MD  finasteride (PROSCAR) 5 MG tablet Take 1 tablet (5 mg total) by mouth  daily. 04/04/16   [name], MD  loratadine (CLARITIN) 10 MG tablet Take 10 mg by mouth daily.    [provider]  lovastatin (MEVACOR) 20 MG tablet TAKE TWO TABLETS BY MOUTH ONCE DAILY FOR CHOLESTEROL 04/16/15   [provider]  metoprolol tartrate (LOPRESSOR) 25 MG tablet Take 12.5 mg by mouth 2 (two) times daily.  01/26/15   [provider]  Multiple Vitamin (MULTI-VITAMINS) TABS Take 1 tablet by mouth daily.     [provider]  oxybutynin (DITROPAN) 5 MG tablet Take 1 tablet (5 mg total) by mouth every 8 (eight) hours as needed for bladder spasms. Patient not taking: Reported on 10/03/2016 01/02/16   [name], MD  tamsulosin (FLOMAX) 0.4 MG CAPS capsule Take 1 capsule (0.4 mg total) by mouth daily. 04/04/16   [name], MD    Allergies No known drug allergies  Family History  Problem Relation Age of Onset  . Nephrotic syndrome Son   . Prostate cancer Neg Hx   . Kidney cancer Neg Hx   . Bladder Cancer Neg Hx     Social History Social History   Tobacco Use  . Smoking status: Never Smoker  . Smokeless tobacco: Former Systems developer    Types: Chew  Substance Use Topics  . Alcohol use: Yes    Alcohol/week: 0.6 oz    Types: 1  Cans of beer per week    Comment: occassional  . Drug use: No    Review of Systems Constitutional: No fever/chills Eyes: No visual changes. ENT: No sore throat. Cardiovascular: Denies chest pain. Respiratory: Denies shortness of breath. Gastrointestinal: No abdominal pain.  No nausea, no vomiting.  No diarrhea.  No constipation. Genitourinary: Negative for dysuria. Musculoskeletal: Negative for neck pain.  Negative for back pain. Integumentary: Negative for rash. Neurological: Negative for headaches, focal weakness or numbness.  Positive for syncope  ____________________________________________   PHYSICAL EXAM:  VITAL SIGNS: ED Triage Vitals  Enc Vitals Group     BP 12/30/16 1230 120/87     Pulse Rate  12/30/16 1225 (!) 56     Resp 12/30/16 1230 18     Temp 12/30/16 1225 98.2 F (36.8 C)     Temp Source 12/30/16 1225 Oral     SpO2 12/30/16 1225 99 %     Weight 12/30/16 1231 95.3 kg (210 lb)     Height 12/30/16 1231 1.753 m (5\' 9" )     Head Circumference --      Peak Flow --      Pain Score --      Pain Loc --      Pain Edu? --      Excl. in [place]? --     Constitutional: Alert and oriented. Well appearing and in no acute distress. Eyes: Conjunctivae are normal. PERRL. EOMI. Head: 10 cm laceration with adjacent 6 cm laceration right occipital scalp Mouth/Throat: Mucous membranes are moist. Oropharynx non-erythematous. Neck: No stridor.  No cervical spine tenderness to palpation. Cardiovascular: Normal rate, regular rhythm. Good peripheral circulation. Grossly normal heart sounds. Respiratory: Normal respiratory effort.  No retractions. Lungs CTAB. Gastrointestinal: Soft and nontender. No distention.  Musculoskeletal: No lower extremity tenderness nor edema. No gross deformities of extremities. Neurologic:  Normal speech and language. No gross focal neurologic deficits are appreciated.  Skin:  Skin is warm, dry and intact. No rash noted. Psychiatric: Mood and affect are normal. Speech and behavior are normal.  ____________________________________________   LABS (all labs ordered are listed, but only abnormal results are displayed)  Labs Reviewed  BASIC METABOLIC PANEL - Abnormal; Notable for the following components:      Result Value   Glucose, Bld 136 (*)    Calcium 8.6 (*)    All other components within normal limits  CBC  TROPONIN I   ____________________________________________  EKG  ED ECG REPORT I, Hope N Adaleah Forget, the attending physician, personally viewed and interpreted this ECG.   Date: 12/30/2016  EKG Time: 12:28 PM  Rate: 62  Rhythm: Normal sinus rhythm  Axis: Normal  Intervals: Normal  ST&T Change: V5 V6 ST segment  depression  ____________________________________________  RADIOLOGY I, Watchtower N Denyse Fillion, personally viewed and evaluated these images (plain radiographs) as part of my medical decision making, as well as reviewing the written report by the radiologist.  Ct Head Wo Contrast  Result Date: 12/30/2016 CLINICAL DATA:  Ataxia.  Dizziness, syncope, hit back of head. EXAM: CT HEAD WITHOUT CONTRAST TECHNIQUE: Contiguous axial images were obtained from the base of the skull through the vertex without intravenous contrast. COMPARISON:  None. FINDINGS: Brain: No acute intracranial abnormality. Specifically, no hemorrhage, hydrocephalus, mass lesion, acute infarction, or significant intracranial injury. Vascular: No hyperdense vessel or unexpected calcification. Skull: No acute calvarial abnormality. Sinuses/Orbits: Old right medial orbital wall blowout fracture. No acute findings. Other: Posterior right parietal soft tissue swelling. IMPRESSION: No  intracranial abnormality. Electronically Signed   By: Rolm Baptise M.D.   On: 12/30/2016 13:24   Dg Chest Port 1 View  Result Date: 12/30/2016 CLINICAL DATA:  Congestion, syncopal episode with subsequent fall. History of hypertension, atrial fibrillation, coronary artery disease. Nonsmoker. EXAM: PORTABLE CHEST 1 VIEW COMPARISON:  Report of a chest x-ray of Jun 02, 2001 FINDINGS: The lungs are adequately inflated. There is no focal infiltrate. The interstitial markings are mildly prominent. The cardiac silhouette is enlarged. The pulmonary vascularity is prominent centrally and exhibits mild cephalization. The mediastinum is normal in width. There is no pleural effusion. IMPRESSION: Low-grade CHF with mild interstitial edema.  No alveolar pneumonia. Electronically Signed   By: David  Martinique M.D.   On: 12/30/2016 13:40     .Marland KitchenLaceration Repair Date/Time: 12/30/2016 2:28 PM Performed by: Gregor Hams, MD Authorized by: Gregor Hams, MD   Consent:     Consent obtained:  Verbal   Consent given by:  Patient   Risks discussed:  Pain, poor cosmetic result and infection Anesthesia (see MAR for exact dosages):    Anesthesia method:  Local infiltration   Local anesthetic:  Lidocaine 1% w/o epi Laceration details:    Location:  Scalp   Scalp location:  Occipital   Length (cm):  9   Depth (mm):  10 Repair type:    Repair type:  Complex Pre-procedure details:    Preparation:  Patient was prepped and draped in usual sterile fashion Treatment:    Area cleansed with:  Betadine and saline   Amount of cleaning:  Standard   Visualized foreign bodies/material removed: no     Debridement:  None   Undermining:  None Skin repair:    Repair method:  Staples   Number of staples:  9 Approximation:    Approximation:  Close Post-procedure details:    Dressing:  Open (no dressing)   Patient tolerance of procedure:  Tolerated well, no immediate complications .Marland KitchenLaceration Repair Date/Time: 12/30/2016 2:35 PM Performed by: Gregor Hams, MD Authorized by: Gregor Hams, MD   Consent:    Consent obtained:  Verbal   Consent given by:  Patient   Risks discussed:  Pain, poor cosmetic result and infection Anesthesia (see MAR for exact dosages):    Anesthesia method:  Local infiltration   Local anesthetic:  Lidocaine 1% w/o epi Laceration details:    Location:  Scalp   Scalp location:  Occipital   Length (cm):  6   Depth (mm):  6 Repair type:    Repair type:  Complex Pre-procedure details:    Preparation:  Patient was prepped and draped in usual sterile fashion Exploration:    Limited defect created (wound extended): no   Treatment:    Area cleansed with:  Betadine and saline   Visualized foreign bodies/material removed: no   Skin repair:    Repair method:  Staples   Number of staples:  6 Approximation:    Approximation:  Close Post-procedure details:    Dressing:  Open (no dressing)   Patient tolerance of procedure:  Tolerated  well, no immediate complications     ____________________________________________   INITIAL IMPRESSION / ASSESSMENT AND PLAN / ED COURSE  As part of my medical decision making, I reviewed the following data within the electronic MEDICAL RECORD NUMBER3 year old male presenting with syncopal episode preceded by tremulous possibly seizure-like activity beforehand.  Laboratory data unremarkable EKG likewise.  CT scan head revealed no acute intracranial abnormality.  Patient's occipital wounds  repaired without difficulty.  Patient discussed with Dr. Vianne Bulls for hospital admission for further evaluation and management of syncope ____________________________________________  FINAL CLINICAL IMPRESSION(S) / ED DIAGNOSES  Final diagnoses:  Syncope, unspecified syncope type  Scalp Laceration   MEDICATIONS GIVEN DURING THIS VISIT:  Medications  lidocaine (PF) (XYLOCAINE) 1 % injection (not administered)  lidocaine (PF) (XYLOCAINE) 1 % injection (not administered)     ED Discharge Orders    None       Note:  This document was prepared using Dragon voice recognition software and may include unintentional dictation errors.    Gregor Hams, MD 12/30/16 [phone]

## 2016-12-30 NOTE — H&P (Signed)
[facility] [place] NAME: [name]    MR#:  [id number]  DATE OF BIRTH:  Aug 19, 1950  DATE OF ADMISSION:  12/30/2016  PRIMARY CARE PHYSICIAN: Ezequiel Kayser, MD   REQUESTING/REFERRING PHYSICIAN: Dr. Marjean Donna  CHIEF COMPLAINT:   Chief Complaint  Patient presents with  . Loss of Consciousness    HISTORY OF PRESENT ILLNESS:  [name]  is a 66 y.o. male with a known history of atrial fibrillation s/p ablation in normal sinus rhythm, coronary artery disease with chronically occluded RCA, hypertension and sleep apnea and is very active at baseline was brought in after a syncopal episode. Patient was at work this morning. He woke up fine, denies any nausea or vomiting, fevers or chills. No recent change in medications. Denies any dehydration or loss of appetite. He had an auto of dizziness and then passed out onto the concrete floor and hit his head. He was out for 2 seconds and came around. Before passing out he had stiffening of his upper body according to weakness. However denies any confusion after he woke up. Immediately realized his surroundings. Denies any chest pain or palpitations prior to this. No previous episodes like this. He complains of some headache now. CT of the head without any acute findings and EKG with normal sinus rhythm. He is being admitted for syncope.  PAST MEDICAL HISTORY:   Past Medical History:  Diagnosis Date  . Atrial fibrillation ([place])   . CAD (coronary artery disease)    chronically occluded RCA  . Dysrhythmia    a fib  . Hypertension   . Sleep apnea   . Testicle pain     PAST SURGICAL HISTORY:   Past Surgical History:  Procedure Laterality Date  . APPENDECTOMY    . COLONOSCOPY    . CYSTOSCOPY WITH BIOPSY N/A 01/02/2016   Procedure: CYSTOSCOPY WITH BIOPSY;  Surgeon: [name], MD;  Location: [facility] ORS;  Service: Urology;  Laterality: N/A;  . heart ablation    . HERNIA REPAIR    .  TONSILLECTOMY    . VASECTOMY      SOCIAL HISTORY:   Social History   Tobacco Use  . Smoking status: Never Smoker  . Smokeless tobacco: Former Systems developer    Types: Chew  Substance Use Topics  . Alcohol use: Yes    Alcohol/week: 0.6 oz    Types: 1 Cans of beer per week    Comment: occassional    FAMILY HISTORY:   Family History  Problem Relation Age of Onset  . Nephrotic syndrome Son   . CAD Mother   . Diabetes Mother   . Throat cancer Father   . Prostate cancer Neg Hx   . Kidney cancer Neg Hx   . Bladder Cancer Neg Hx     DRUG ALLERGIES:  No Known Allergies  REVIEW OF SYSTEMS:   Review of Systems  Constitutional: Negative for chills, fever, malaise/fatigue and weight loss.  HENT: Negative for ear discharge, ear pain, hearing loss, nosebleeds and tinnitus.   Eyes: Negative for blurred vision, double vision and photophobia.  Respiratory: Negative for cough, hemoptysis, shortness of breath and wheezing.   Cardiovascular: Negative for chest pain, palpitations, orthopnea and leg swelling.  Gastrointestinal: Negative for abdominal pain, constipation, diarrhea, heartburn, melena, nausea and vomiting.  Genitourinary: Negative for dysuria, frequency, hematuria and urgency.  Musculoskeletal: Negative for back pain, myalgias and neck pain.  Skin: Negative for rash.  Neurological: Positive for  dizziness and headaches. Negative for tingling, tremors, sensory change, speech change and focal weakness.  Endo/Heme/Allergies: Does not bruise/bleed easily.  Psychiatric/Behavioral: Negative for depression.    MEDICATIONS AT HOME:   Prior to Admission medications   Medication Sig Start Date End Date Taking? Authorizing Provider  aspirin EC 81 MG tablet Take 81 mg by mouth daily.    Yes [provider]  cetirizine (ZYRTEC) 10 MG tablet Take 10 mg by mouth daily.   Yes [provider]  finasteride (PROSCAR) 5 MG tablet Take 1 tablet (5 mg total) by mouth daily. 04/04/16   Yes [name], MD  lovastatin (MEVACOR) 20 MG tablet TAKE TWO TABLETS BY MOUTH ONCE DAILY FOR CHOLESTEROL 04/16/15  Yes [provider]  metoprolol tartrate (LOPRESSOR) 25 MG tablet Take 12.5 mg by mouth 2 (two) times daily.  01/26/15  Yes [provider]  Multiple Vitamin (MULTI-VITAMINS) TABS Take 1 tablet by mouth daily.    Yes [provider]  tamsulosin (FLOMAX) 0.4 MG CAPS capsule Take 1 capsule (0.4 mg total) by mouth daily. 04/04/16  Yes [name], MD  docusate sodium (COLACE) 100 MG capsule Take 1 capsule (100 mg total) by mouth 2 (two) times daily. Patient not taking: Reported on 10/03/2016 01/02/16   [name], MD  oxybutynin (DITROPAN) 5 MG tablet Take 1 tablet (5 mg total) by mouth every 8 (eight) hours as needed for bladder spasms. Patient not taking: Reported on 10/03/2016 01/02/16   [name], MD      VITAL SIGNS:  Blood pressure 123/84, pulse (!) 54, temperature 98.2 F (36.8 C), temperature source Oral, resp. rate 18, height 5\' 9"  (1.753 m), weight 95.3 kg (210 lb), SpO2 99 %.  PHYSICAL EXAMINATION:   Physical Exam  GENERAL:  66 y.o.-year-old patient lying in the bed with no acute distress.  EYES: Pupils equal, round, reactive to light and accommodation. No scleral icterus. Extraocular muscles intact.  HEENT: Head   normocephalic. Oropharynx and nasopharynx clear.  NECK:  Supple, no jugular venous distention. No thyroid enlargement, no tenderness.  LUNGS: Normal breath sounds bilaterally, no wheezing, rales,rhonchi or crepitation. No use of accessory muscles of respiration.  CARDIOVASCULAR: S1, S2 normal. No murmurs, rubs, or gallops.  ABDOMEN: Soft, nontender, nondistended. Bowel sounds present. No organomegaly or mass.  EXTREMITIES: No pedal edema, cyanosis, or clubbing.  NEUROLOGIC: Cranial nerves II through XII are intact. Muscle strength 5/5 in all extremities. Sensation intact. Gait not checked.  PSYCHIATRIC: The  patient is alert and oriented x 3.  SKIN: No obvious rash, lesion, or ulcer.   LABORATORY PANEL:   CBC Recent Labs  Lab 12/30/16 1230  WBC 9.3  HGB 14.9  HCT 43.8  PLT 186   ------------------------------------------------------------------------------------------------------------------  Chemistries  Recent Labs  Lab 12/30/16 1230  NA 136  K 4.3  CL 103  CO2 24  GLUCOSE 136*  BUN 20  CREATININE 0.89  CALCIUM 8.6*   ------------------------------------------------------------------------------------------------------------------  Cardiac Enzymes Recent Labs  Lab 12/30/16 1230  TROPONINI <0.03   ------------------------------------------------------------------------------------------------------------------  RADIOLOGY:  Ct Head Wo Contrast  Result Date: 12/30/2016 CLINICAL DATA:  Ataxia.  Dizziness, syncope, hit back of head. EXAM: CT HEAD WITHOUT CONTRAST TECHNIQUE: Contiguous axial images were obtained from the base of the skull through the vertex without intravenous contrast. COMPARISON:  None. FINDINGS: Brain: No acute intracranial abnormality. Specifically, no hemorrhage, hydrocephalus, mass lesion, acute infarction, or significant intracranial injury. Vascular: No hyperdense vessel or unexpected calcification. Skull: No acute calvarial abnormality. Sinuses/Orbits:  Old right medial orbital wall blowout fracture. No acute findings. Other: Posterior right parietal soft tissue swelling. IMPRESSION: No intracranial abnormality. Electronically Signed   By: Rolm Baptise M.D.   On: 12/30/2016 13:24   Dg Chest Port 1 View  Result Date: 12/30/2016 CLINICAL DATA:  Congestion, syncopal episode with subsequent fall. History of hypertension, atrial fibrillation, coronary artery disease. Nonsmoker. EXAM: PORTABLE CHEST 1 VIEW COMPARISON:  Report of a chest x-ray of Jun 02, 2001 FINDINGS: The lungs are adequately inflated. There is no focal infiltrate. The interstitial markings  are mildly prominent. The cardiac silhouette is enlarged. The pulmonary vascularity is prominent centrally and exhibits mild cephalization. The mediastinum is normal in width. There is no pleural effusion. IMPRESSION: Low-grade CHF with mild interstitial edema.  No alveolar pneumonia. Electronically Signed   By: David  Martinique M.D.   On: 12/30/2016 13:40    EKG:   Orders placed or performed during the hospital encounter of 12/30/16  . EKG 12-Lead  . EKG 12-Lead  . EKG 12-Lead  . EKG 12-Lead  . ED EKG  . ED EKG    IMPRESSION AND PLAN:   [name]  is a 66 y.o. male with a known history of atrial fibrillation s/p ablation in normal sinus rhythm, coronary artery disease with chronically occluded RCA, hypertension and sleep apnea and is very active at baseline was brought in after a syncopal episode.  1. Syncope- unsure what exactly the cause is. Does not sound like cardiac. - CT head without any acute findings. Carotid Dopplers and echo ordered. Monitor on telemetry, troponins recycle -Orthostatic blood pressure checks -Could be vasovagal. Check urinalysis to rule out infection. IV fluids -Physical therapy for ambulation  2. Atrial fibrillation-status post ablation a few years ago, remains in normal sinus rhythm since then. -Stress test in May 2018 negative. Follows with cardiology as outpatient -Only on aspirin for anticoagulation due to Chads score of only 3  3. Sleep apnea-continue CPAP at bedtime  4. Hypertension-on low-dose metoprolol  5. BPH-on Flomax and Proscar  6. DVT prophylaxis-on Lovenox   All the records are reviewed and case discussed with ED provider. Management plans discussed with the patient, family and they are in agreement.  CODE STATUS: Full Code  TOTAL TIME TAKING CARE OF THIS PATIENT: 50 minutes.    Gladstone Lighter M.D on 12/30/2016 at 3:07 PM  Between 7am to 6pm - Pager - [phone]  After 6pm go to [email or web address] - password [email or web address]  [facility]  Office  [phone]  CC: Primary care physician; Ezequiel Kayser, MD

## 2016-12-30 NOTE — ED Notes (Signed)
Patient transported to CT 

## 2016-12-30 NOTE — ED Notes (Signed)
Hospitalist to bedside at this time 

## 2016-12-30 NOTE — ED Notes (Signed)
Called to give report, bed not assigned yet. RN will call back.

## 2016-12-30 NOTE — ED Notes (Signed)
EDP to bedside to repair laceration to head.

## 2016-12-30 NOTE — Progress Notes (Signed)
RT called to room to set up home CPAP machine. Performed preliminary check, machine looks to be in good working condition, no broken wires or plastic outer casing. Informed unit secretary to have [employer] check out machine in am

## 2016-12-31 ENCOUNTER — Observation Stay: Admit: 2016-12-31 | Payer: Medicare Other

## 2016-12-31 DIAGNOSIS — R55 Syncope and collapse: Secondary | ICD-10-CM | POA: Diagnosis not present

## 2016-12-31 LAB — BASIC METABOLIC PANEL
Anion gap: 5 (ref 5–15)
BUN: 19 mg/dL (ref 6–20)
CHLORIDE: 107 mmol/L (ref 101–111)
CO2: 24 mmol/L (ref 22–32)
CREATININE: 0.81 mg/dL (ref 0.61–1.24)
Calcium: 8.4 mg/dL — ABNORMAL LOW (ref 8.9–10.3)
GFR calc non Af Amer: >60 mL/min (ref >60)
Glucose, Bld: 108 mg/dL — ABNORMAL HIGH (ref 65–99)
POTASSIUM: 3.7 mmol/L (ref 3.5–5.1)
Sodium: 136 mmol/L (ref 135–145)

## 2016-12-31 LAB — CBC
HEMATOCRIT: 38.8 % — AB (ref 40.0–52.0)
Hemoglobin: 13.2 g/dL (ref 13.0–18.0)
MCH: 33.7 pg (ref 26.0–34.0)
MCHC: 34.1 g/dL (ref 32.0–36.0)
MCV: 98.7 fL (ref 80.0–100.0)
PLATELETS: 161 10*3/uL (ref 150–440)
RBC: 3.93 MIL/uL — AB (ref 4.40–5.90)
RDW: 13.9 % (ref 11.5–14.5)
WBC: 6.4 10*3/uL (ref 3.8–10.6)

## 2016-12-31 LAB — TROPONIN I: Troponin I: <0.03 ng/mL (ref <0.03)

## 2016-12-31 MED ORDER — METOPROLOL TARTRATE 25 MG PO TABS: 12.5000 mg | ORAL_TABLET | Freq: Two times a day (BID) | ORAL | Status: DC

## 2016-12-31 NOTE — Care Management (Signed)
Patient admitted from home with syncope.  Patient had an episode of syncope and fell at work.  PT has assessed patient and recommended  Home health PT.  Patient states that he plans to return to work after discharge.  Does not meet home bound criteria.  RNCM informed patient that if he should required PT services his PCP could request outpatient PT from his PCP.  PCP [name], pharmacy [facility].  No rncm needs identified.  RNCM signing off.

## 2016-12-31 NOTE — Discharge Summary (Signed)
[facility] NAME: [name]    MR#:  [id number]  DATE OF BIRTH:  [birth year]  DATE OF ADMISSION:  12/30/2016   ADMITTING PHYSICIAN: Gladstone Lighter, MD  DATE OF DISCHARGE: 12/31/2016  PRIMARY CARE PHYSICIAN: Ezequiel Kayser, MD   ADMISSION DIAGNOSIS:  Syncope, unspecified syncope type [R55] DISCHARGE DIAGNOSIS:  Active Problems:   Syncope  SECONDARY DIAGNOSIS:   Past Medical History:  Diagnosis Date  . Atrial fibrillation ([place])   . CAD (coronary artery disease)    chronically occluded RCA  . Dysrhythmia    a fib  . Hypertension   . Sleep apnea   . Testicle pain    HOSPITAL COURSE:   [name]  is a 66 y.o. male with a known history of atrial fibrillation s/p ablation in normal sinus rhythm, coronary artery disease with chronically occluded RCA, hypertension and sleep apnea and is very active at baseline was brought in after a syncopal episode.  1. Syncope- unsure what exactly the cause is. Does not sound like cardiac. - CT head without any acute findings. Carotid Doppler is unremarkable and echo as outpatient.  -Could be vasovagal.  2. Atrial fibrillation-status post ablation a few years ago, remains in normal sinus rhythm since then. -Stress test in May 2018 negative. Follows with cardiology as outpatient Continue aspirin for stroke prevention. Patient defers chronic anticoagulation per cardiology consult.   Bradycardia.  Continue Lopressor 12.5 mg twice daily per cardiology consult.  3. Sleep apnea-continue CPAP at bedtime  4. Hypertension-on low-dose metoprolol  5. BPH-on Flomax and Proscar  Discussed with Dr. Saralyn Pilar.  DISCHARGE CONDITIONS:  Stable, discharge to home today. CONSULTS OBTAINED:  Treatment Team:  [name], MD DRUG ALLERGIES:  No Known Allergies DISCHARGE MEDICATIONS:   Allergies as of 12/31/2016   No Known Allergies     Medication List    TAKE these  medications   aspirin EC 81 MG tablet Take 81 mg by mouth daily.   cetirizine 10 MG tablet Commonly known as:  ZYRTEC Take 10 mg by mouth daily.   docusate sodium 100 MG capsule Commonly known as:  COLACE Take 1 capsule (100 mg total) by mouth 2 (two) times daily.   finasteride 5 MG tablet Commonly known as:  PROSCAR Take 1 tablet (5 mg total) by mouth daily.   lovastatin 20 MG tablet Commonly known as:  MEVACOR TAKE TWO TABLETS BY MOUTH ONCE DAILY FOR CHOLESTEROL   metoprolol tartrate 25 MG tablet Commonly known as:  LOPRESSOR Take 12.5 mg by mouth 2 (two) times daily. Notes to patient:  Take 1/2 tablet, (12.5 mg) tonight before bed   MULTI-VITAMINS Tabs Take 1 tablet by mouth daily.   oxybutynin 5 MG tablet Commonly known as:  DITROPAN Take 1 tablet (5 mg total) by mouth every 8 (eight) hours as needed for bladder spasms.   tamsulosin 0.4 MG Caps capsule Commonly known as:  FLOMAX Take 1 capsule (0.4 mg total) by mouth daily.        DISCHARGE INSTRUCTIONS:  See AVS.  If you experience worsening of your admission symptoms, develop shortness of breath, life threatening emergency, suicidal or homicidal thoughts you must seek medical attention immediately by calling 911 or calling your MD immediately  if symptoms less severe.  You Must read complete instructions/literature along with all the possible adverse reactions/side effects for all the Medicines you take and that have been prescribed to you. Take any new Medicines after  you have completely understood and accpet all the possible adverse reactions/side effects.   Please note  You were cared for by a hospitalist during your hospital stay. If you have any questions about your discharge medications or the care you received while you were in the hospital after you are discharged, you can call the unit and asked to speak with the hospitalist on call if the hospitalist that took care of you is not available. Once you are  discharged, your primary care physician will handle any further medical issues. Please note that NO REFILLS for any discharge medications will be authorized once you are discharged, as it is imperative that you return to your primary care physician (or establish a relationship with a primary care physician if you do not have one) for your aftercare needs so that they can reassess your need for medications and monitor your lab values.    On the day of Discharge:  VITAL SIGNS:  Blood pressure 115/79, pulse 60, temperature 97.7 F (36.5 C), temperature source Oral, resp. rate 18, height 5\' 9"  (1.753 m), weight 210 lb (95.3 kg), SpO2 98 %. PHYSICAL EXAMINATION:  GENERAL:  66 y.o.-year-old patient lying in the bed with no acute distress.  EYES: Pupils equal, round, reactive to light and accommodation. No scleral icterus. Extraocular muscles intact.  HEENT: Head atraumatic, normocephalic. Oropharynx and nasopharynx clear.  NECK:  Supple, no jugular venous distention. No thyroid enlargement, no tenderness.  LUNGS: Normal breath sounds bilaterally, no wheezing, rales,rhonchi or crepitation. No use of accessory muscles of respiration.  CARDIOVASCULAR: S1, S2 normal. No murmurs, rubs, or gallops.  ABDOMEN: Soft, non-tender, non-distended. Bowel sounds present. No organomegaly or mass.  EXTREMITIES: No pedal edema, cyanosis, or clubbing.  NEUROLOGIC: Cranial nerves II through XII are intact. Muscle strength 5/5 in all extremities. Sensation intact. Gait not checked.  PSYCHIATRIC: The patient is alert and oriented x 3.  SKIN: No obvious rash, lesion, or ulcer.  DATA REVIEW:   CBC Recent Labs  Lab 12/31/16 0406  WBC 6.4  HGB 13.2  HCT 38.8*  PLT 161    Chemistries  Recent Labs  Lab 12/31/16 0406  NA 136  K 3.7  CL 107  CO2 24  GLUCOSE 108*  BUN 19  CREATININE 0.81  CALCIUM 8.4*     Microbiology Results  Results for orders placed or performed during the hospital encounter of  12/27/15  Urine culture     Status: None   Collection Time: 12/27/15 10:33 AM  Result Value Ref Range Status   Specimen Description URINE, CLEAN CATCH  Final   Special Requests NONE  Final   Culture NO GROWTH Performed at [facility]   Final   Report Status 12/28/2015 FINAL  Final    RADIOLOGY:  US Carotid Bilateral  Result Date: 12/31/2016 CLINICAL DATA:  Syncope, hypertension and hyperlipidemia. EXAM: BILATERAL CAROTID DUPLEX ULTRASOUND TECHNIQUE: Pearline Cables scale imaging, color Doppler and duplex ultrasound were performed of bilateral carotid and vertebral arteries in the neck. COMPARISON:  None. FINDINGS: Criteria: Quantification of carotid stenosis is based on velocity parameters that correlate the residual internal carotid diameter with NASCET-based stenosis levels, using the diameter of the distal internal carotid lumen as the denominator for stenosis measurement. The following velocity measurements were obtained: RIGHT ICA:  98/20 cm/sec CCA:  47/82 cm/sec SYSTOLIC ICA/CCA RATIO:  1.1 DIASTOLIC ICA/CCA RATIO:  0.9 ECA:  178 cm/sec LEFT ICA:  117/25 cm/sec CCA:  956/21 cm/sec SYSTOLIC ICA/CCA RATIO:  0.8 DIASTOLIC  ICA/CCA RATIO:  1.2 ECA:  117 cm/sec RIGHT CAROTID ARTERY: Mild amount of calcified plaque at the level of the carotid bulb and proximal right ICA. Velocities and waveforms are normal and estimated right ICA stenosis is less than 50%. RIGHT VERTEBRAL ARTERY: Antegrade flow with normal waveform and velocity. LEFT CAROTID ARTERY: Mild amount of plaque at the level of the carotid bulb extending just up to the left ICA origin. Velocities and waveforms are within normal limits. Estimated left ICA stenosis is less than 50%. LEFT VERTEBRAL ARTERY: Antegrade flow with normal waveform and velocity. IMPRESSION: Mild amount of plaque at the level of bilateral carotid bulbs and ICA origins. Estimated bilateral ICA stenoses are less than 50%. Electronically Signed   By: Aletta Edouard M.D.    On: 12/31/2016 08:21     Management plans discussed with the patient, his wife and they are in agreement.  CODE STATUS: Full Code   TOTAL TIME TAKING CARE OF THIS PATIENT: 35 minutes.    Demetrios Loll M.D on 12/31/2016 at 2:10 PM  Between 7am to 6pm - Pager - [phone]  After 6pm go to [email or web address] - [employer] [facility]  Office  [phone]  CC: Primary care physician; Ezequiel Kayser, MD   Note: This dictation was prepared with Dragon dictation along with smaller phrase technology. Any transcriptional errors that result from this process are unintentional.

## 2016-12-31 NOTE — Plan of Care (Signed)
  Progressing Pain Managment: General experience of comfort will improve 12/31/2016 0058 - Progressing by Jeri Cos, RN Skin Integrity: Risk for impaired skin integrity will decrease 12/31/2016 0058 - Progressing by Jeri Cos, RN  Pt with staples to back of head.

## 2016-12-31 NOTE — Evaluation (Signed)
Physical Therapy Evaluation Patient Details Name: [name] [facility] MRN: [id number] DOB: Oct 04, 1950 Today's Date: 12/31/2016   History of Present Illness  Pt is a45 y.o.malewith a known history of atrial fibrillation s/p ablation in normal sinus rhythm,coronary artery disease with chronically occluded RCA, hypertension and sleep apnea and is very active at baseline was brought in after a syncopal episode. Patient was at work when he had dizziness and then passed out onto the concrete floor and hit his head. He was out for 2 seconds and came around. Before passing out he had stiffening of his upper body. He woke up fine, denies any nausea or vomiting, fevers or chills. No recent change in medications. Denies any dehydration or loss of appetite.  Denies any confusion after he woke up. Immediately realized his surroundings. Denies any chest pain or palpitations prior to this. No previous episodes like this. He complains of some headache now. CT of the head without any acute findings and EKG with normal sinus rhythm. He is being admitted for syncope.    Clinical Impression  Pt presents with good functional strength but c/o mild dizziness with standing from EOB.  Orthostatics as follows - Supine: 135/74 mmHg, HR 60 bpm; Sitting: 139/76 mmHg, HR 61 bpm; Standing: 177/100 mmHg, HR 65 bpm with mild dizziness.  Pt's SpO2 on room air 98% throughout.  Amb deferred with pt and nursing notified.  Pt may benefit from a short duration of HHPT once cleared for discharge medically from acute care for safe progression of activity with vital signs and symptoms monitored for return to PLOF.     Follow Up Recommendations Home health PT    Equipment Recommendations  None recommended by PT    Recommendations for Other Services       Precautions / Restrictions Precautions Precautions: Fall Restrictions Weight Bearing Restrictions: No      Mobility  Bed Mobility Overal bed mobility: Independent                Transfers Overall transfer level: Independent Equipment used: None             General transfer comment: Good effort and steady upon initial stand from EOB  Ambulation/Gait             General Gait Details: Amb deferred secondary to significant elevation of BP in standing with mild dizziness, nursing notified  Stairs            Wheelchair Mobility    Modified Rankin (Stroke Patients Only)       Balance Overall balance assessment: Needs assistance Sitting-balance support: Feet unsupported;Feet supported;No upper extremity supported Sitting balance-Leahy Scale: Normal     Standing balance support: Single extremity supported Standing balance-Leahy Scale: Good                               Pertinent Vitals/Pain Pain Assessment: No/denies pain    Home Living Family/patient expects to be discharged to:: Private residence Living Arrangements: Spouse/significant other Available Help at Discharge: Family;Available 24 hours/day Type of Home: House Home Access: Stairs to enter Entrance Stairs-Rails: Left Entrance Stairs-Number of Steps: 4 Home Layout: Two level;Bed/bath upstairs Home Equipment: None      Prior Function Level of Independence: Independent         Comments: Highly active including recent trip to [place], no other falls other than current fall, Ind with ADLs and IADLs, works Therapist, nutritional  Dominance   Dominant Hand: Right    Extremity/Trunk Assessment   Upper Extremity Assessment Upper Extremity Assessment: Overall WFL for tasks assessed    Lower Extremity Assessment Lower Extremity Assessment: Overall WFL for tasks assessed    Cervical / Trunk Assessment Cervical / Trunk Assessment: Normal  Communication   Communication: No difficulties  Cognition Arousal/Alertness: Awake/alert Behavior During Therapy: WFL for tasks assessed/performed Overall Cognitive Status: Within Functional Limits for tasks assessed                                         General Comments      Exercises Total Joint Exercises Ankle Circles/Pumps: AROM;Both;10 reps Quad Sets: Strengthening;Both;10 reps Gluteal Sets: Strengthening;Both;10 reps Heel Slides: AROM;Both;10 reps Hip ABduction/ADduction: AROM;Both;10 reps Straight Leg Raises: AROM;Both;10 reps Long Arc Quad: AROM;Both;10 reps Knee Flexion: AROM;Both;10 reps Other Exercises Other Exercises: HEP education while in acute care to prevent deconditioning   Assessment/Plan    PT Assessment Patient needs continued PT services  PT Problem List Decreased balance       PT Treatment Interventions Gait training;Stair training;Neuromuscular re-education;Balance training;Therapeutic exercise;Patient/family education    PT Goals (Current goals can be found in the Care Plan section)  Acute Rehab PT Goals Patient Stated Goal: To return home to typical activities PT Goal Formulation: With patient Time For Goal Achievement: 01/13/17 Potential to Achieve Goals: Good    Frequency Min 2X/week   Barriers to discharge        Co-evaluation               AM-PAC PT "6 Clicks" Daily Activity  Outcome Measure Difficulty turning over in bed (including adjusting bedclothes, sheets and blankets)?: None Difficulty moving from lying on back to sitting on the side of the bed? : None Difficulty sitting down on and standing up from a chair with arms (e.g., wheelchair, bedside commode, etc,.)?: None Help needed moving to and from a bed to chair (including a wheelchair)?: A Little Help needed walking in hospital room?: A Little Help needed climbing 3-5 steps with a railing? : A Little 6 Click Score: 21    End of Session Equipment Utilized During Treatment: Gait belt Activity Tolerance: Other (comment)(Amb deferred secondary to BP increase with dizziness) Patient left: in bed;with bed alarm set;with family/visitor present;with call bell/phone within  reach Nurse Communication: Mobility status;Other (comment)(BP response to positioning) PT Visit Diagnosis: Dizziness and giddiness (R42);Difficulty in walking, not elsewhere classified (R26.2)    Time: 6948-5462 PT Time Calculation (min) (ACUTE ONLY): 37 min   Charges:   PT Evaluation $PT Eval Low Complexity: 1 Low PT Treatments $Therapeutic Exercise: 8-22 mins   PT G Codes:   PT G-Codes **NOT FOR INPATIENT CLASS** Functional Assessment Tool Used: AM-PAC 6 Clicks Basic Mobility Functional Limitation: Mobility: Walking and moving around Mobility: Walking and Moving Around Current Status (V0350): At least 20 percent but less than 40 percent impaired, limited or restricted Mobility: Walking and Moving Around Goal Status [phone]): 0 percent impaired, limited or restricted    D. Royetta Asal PT, DPT 12/31/16, 11:36 AM

## 2016-12-31 NOTE — Progress Notes (Signed)
Pt discharged home with wife, pt VSS and no complaints at this time. Pt educated on current medication regimen, and acknowledges follow-up appointments.

## 2016-12-31 NOTE — Care Management Obs Status (Signed)
[facility] NOTIFICATION   Patient Details  Name: [name] MRN: [id number] Date of Birth: 27-Jul-1950   Medicare Observation Status Notification Given:  No(admitted obs less than 24 hours)    Beverly Sessions, RN 12/31/2016, 12:53 PM

## 2016-12-31 NOTE — Consult Note (Signed)
[facility] Cardiology  CARDIOLOGY CONSULT NOTE  Patient ID: [name] [facility] MRN: [id number] DOB/AGE: 04-24-1950 66 y.o.  Admit date: 12/30/2016 Referring Physician Bridgett Larsson Primary Physician [facility] Primary Cardiologist Paraschos Reason for Consultation Syncope, bradycardia  HPI: 66 year old male referred for evaluation of syncope and bradycardia. The patient has a history of coronary artery disease with chronically occluded RCA by cardiac catheterization 02/2010, paroxysmal atrial flutter, status post ablation in 2011, with a chads vasc score of 3, on aspirin for stroke prevention, hypertension, hyperlipidemia, sleep apnea, and bradycardia.  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  The episode of syncope was witnessed.  Apparently the patient had upper body tensing and tremors just prior to passing out.  The patient lost consciousness for about 2 seconds and was aware of his surroundings when he regained consciousness.  He denies prodromal chest pain, diaphoresis, shortness of breath, or palpitations.  The patient denies a prior episode of syncope.  The patient was brought to [facility] ER for further evaluation. Admission labs notable for negative troponin x 4.  ECG revealed normal sinus rhythm at a rate 62 bpm with ST segment depression in V5 and V6.  Bilateral carotid ultrasound revealed mild plaque with less than 50% stenosis bilaterally.  Chest x-ray revealed mild interstitial edema.  Head CT negative for intracranial abnormality. The patient sustained an occipital laceration, which was repaired.  The patient was noted to be bradycardic with heart rates in the upper 50s.  His low-dose metoprolol was held.  The patient has a history of bradycardia in the past, previously tolerating low-dose metoprolol.  Of note, the patient recently  underwent a stress echocardiogram in May 2018 which revealed normal left ventricular function, without evidence of ischemia.  Currently, the patient reports feeling well, and just finished eating lunch.  He denies chest pain, shortness of breath, lightheadedness, or palpitations.  He has some mild head tenderness.    Review of systems complete and found to be negative unless listed above     Past Medical History:  Diagnosis Date  . Atrial fibrillation ([place])   . CAD (coronary artery disease)    chronically occluded RCA  . Dysrhythmia    a fib  . Hypertension   . Sleep apnea   . Testicle pain     Past Surgical History:  Procedure Laterality Date  . APPENDECTOMY    . COLONOSCOPY    . CYSTOSCOPY WITH BIOPSY N/A 01/02/2016   Procedure: CYSTOSCOPY WITH BIOPSY;  Surgeon: [name], MD;  Location: [facility] ORS;  Service: Urology;  Laterality: N/A;  . heart ablation    . HERNIA REPAIR    . TONSILLECTOMY    . VASECTOMY      Medications Prior to Admission  Medication Sig Dispense Refill Last Dose  . aspirin EC 81 MG tablet Take 81 mg by mouth daily.    12/30/2016 at AM  . cetirizine (ZYRTEC) 10 MG tablet Take 10 mg by mouth daily.   12/29/2016 at PM  . finasteride (PROSCAR) 5 MG tablet Take 1 tablet (5 mg total) by mouth daily. 30 tablet 11 12/30/2016 at AM  . lovastatin (MEVACOR) 20 MG tablet TAKE TWO TABLETS BY MOUTH ONCE DAILY FOR CHOLESTEROL   12/29/2016 at PM  . metoprolol tartrate (LOPRESSOR) 25 MG tablet Take 12.5 mg by mouth 2 (two) times  daily.    12/30/2016 at AM  . Multiple Vitamin (MULTI-VITAMINS) TABS Take 1 tablet by mouth daily.    12/30/2016 at AM  . tamsulosin (FLOMAX) 0.4 MG CAPS capsule Take 1 capsule (0.4 mg total) by mouth daily. 30 capsule 11 12/30/2016 at AM  . docusate sodium (COLACE) 100 MG capsule Take 1 capsule (100 mg total) by mouth 2 (two) times daily. (Patient not taking: Reported on 10/03/2016) 60 capsule 0 Not Taking at Unknown time  . oxybutynin  (DITROPAN) 5 MG tablet Take 1 tablet (5 mg total) by mouth every 8 (eight) hours as needed for bladder spasms. (Patient not taking: Reported on 10/03/2016) 30 tablet 0 Not Taking at Unknown time   Social History   Socioeconomic History  . Marital status: Married    Spouse name: Not on file  . Number of children: Not on file  . Years of education: Not on file  . Highest education level: Not on file  Social Needs  . Financial resource strain: Not on file  . Food insecurity - worry: Not on file  . Food insecurity - inability: Not on file  . Transportation needs - medical: Not on file  . Transportation needs - non-medical: Not on file  Occupational History  . Not on file  Tobacco Use  . Smoking status: Never Smoker  . Smokeless tobacco: Former Systems developer    Types: Chew  Substance and Sexual Activity  . Alcohol use: Yes    Alcohol/week: 0.6 oz    Types: 1 Cans of beer per week    Comment: occassional  . Drug use: No  . Sexual activity: Not on file  Other Topics Concern  . Not on file  Social History Narrative   Very active and independent at baseline.    Family History  Problem Relation Age of Onset  . Nephrotic syndrome Son   . CAD Mother   . Diabetes Mother   . Throat cancer Father   . Prostate cancer Neg Hx   . Kidney cancer Neg Hx   . Bladder Cancer Neg Hx       Review of systems complete and found to be negative unless listed above      PHYSICAL EXAM  General: Well developed, well nourished, in no acute distress HEENT:  Normocephalic.  Occipital laceration with dried blood and red blood.  Gauze and Coban reapplied. Neck:  No JVD.  Lungs: Clear bilaterally to auscultation.  Normal effort of breathing on room air Heart: Regular rhythm, bradycardic. Normal S1 and S2 without gallops or murmurs.  Abdomen: Bowel sounds are positive, abdomen soft Msk:  Back normal, able to sit upright in bed. Normal strength and tone for age. Extremities: No clubbing, cyanosis or edema.    Neuro: Alert and oriented X 3. Psych:  Good affect, responds appropriately  Labs:   Lab Results  Component Value Date   WBC 6.4 12/31/2016   HGB 13.2 12/31/2016   HCT 38.8 (L) 12/31/2016   MCV 98.7 12/31/2016   PLT 161 12/31/2016    Recent Labs  Lab 12/31/16 0406  NA 136  K 3.7  CL 107  CO2 24  BUN 19  CREATININE 0.81  CALCIUM 8.4*  GLUCOSE 108*   Lab Results  Component Value Date   TROPONINI <0.03 12/31/2016   No results found for: CHOL No results found for: HDL No results found for: LDLCALC No results found for: TRIG No results found for: CHOLHDL No results found for: LDLDIRECT  Radiology: Ct Head Wo Contrast  Result Date: 12/30/2016 CLINICAL DATA:  Ataxia.  Dizziness, syncope, hit back of head. EXAM: CT HEAD WITHOUT CONTRAST TECHNIQUE: Contiguous axial images were obtained from the base of the skull through the vertex without intravenous contrast. COMPARISON:  None. FINDINGS: Brain: No acute intracranial abnormality. Specifically, no hemorrhage, hydrocephalus, mass lesion, acute infarction, or significant intracranial injury. Vascular: No hyperdense vessel or unexpected calcification. Skull: No acute calvarial abnormality. Sinuses/Orbits: Old right medial orbital wall blowout fracture. No acute findings. Other: Posterior right parietal soft tissue swelling. IMPRESSION: No intracranial abnormality. Electronically Signed   By: Rolm Baptise M.D.   On: 12/30/2016 13:24   US Carotid Bilateral  Result Date: 12/31/2016 CLINICAL DATA:  Syncope, hypertension and hyperlipidemia. EXAM: BILATERAL CAROTID DUPLEX ULTRASOUND TECHNIQUE: Pearline Cables scale imaging, color Doppler and duplex ultrasound were performed of bilateral carotid and vertebral arteries in the neck. COMPARISON:  None. FINDINGS: Criteria: Quantification of carotid stenosis is based on velocity parameters that correlate the residual internal carotid diameter with NASCET-based stenosis levels, using the diameter of the  distal internal carotid lumen as the denominator for stenosis measurement. The following velocity measurements were obtained: RIGHT ICA:  98/20 cm/sec CCA:  76/16 cm/sec SYSTOLIC ICA/CCA RATIO:  1.1 DIASTOLIC ICA/CCA RATIO:  0.9 ECA:  178 cm/sec LEFT ICA:  117/25 cm/sec CCA:  073/71 cm/sec SYSTOLIC ICA/CCA RATIO:  0.8 DIASTOLIC ICA/CCA RATIO:  1.2 ECA:  117 cm/sec RIGHT CAROTID ARTERY: Mild amount of calcified plaque at the level of the carotid bulb and proximal right ICA. Velocities and waveforms are normal and estimated right ICA stenosis is less than 50%. RIGHT VERTEBRAL ARTERY: Antegrade flow with normal waveform and velocity. LEFT CAROTID ARTERY: Mild amount of plaque at the level of the carotid bulb extending just up to the left ICA origin. Velocities and waveforms are within normal limits. Estimated left ICA stenosis is less than 50%. LEFT VERTEBRAL ARTERY: Antegrade flow with normal waveform and velocity. IMPRESSION: Mild amount of plaque at the level of bilateral carotid bulbs and ICA origins. Estimated bilateral ICA stenoses are less than 50%. Electronically Signed   By: Aletta Edouard M.D.   On: 12/31/2016 08:21   Dg Chest Port 1 View  Result Date: 12/30/2016 CLINICAL DATA:  Congestion, syncopal episode with subsequent fall. History of hypertension, atrial fibrillation, coronary artery disease. Nonsmoker. EXAM: PORTABLE CHEST 1 VIEW COMPARISON:  Report of a chest x-ray of Jun 02, 2001 FINDINGS: The lungs are adequately inflated. There is no focal infiltrate. The interstitial markings are mildly prominent. The cardiac silhouette is enlarged. The pulmonary vascularity is prominent centrally and exhibits mild cephalization. The mediastinum is normal in width. There is no pleural effusion. IMPRESSION: Low-grade CHF with mild interstitial edema.  No alveolar pneumonia. Electronically Signed   By: David  Martinique M.D.   On: 12/30/2016 13:40    EKG: Sinus bradycardia, 59 bpm  ASSESSMENT AND PLAN:  1.  Syncope, likely vasovagal syncope with prodromal upper body tensing, tremors and lightheadedness. Negative serial troponin. Head CT negative. Carotid ultrasound without hemodynamically significant stenosis. 2. Bradycardia, on low dose metoprolol, with a history of bradycardia, previously able to tolerate low-dose metoprolol. 3. Coronary artery disease, with chronically occluded RCA by cardiac catheterization 02/13/2010, currently without chest pain, negative troponin x 4.  Recent stress echocardiogram on 05/26/16 revealed normal left ventricular function and no echocardiographic evidence for ischemia. 4. Paroxysmal atrial flutter, status post catheter ablation in 2011, with a chads vasc score of 3, currently on aspirin for  stroke prevention. The patient has remained hesitant to start chronic anticoagulation. Rate and rhythm controlled. 5. Essential hypertension, well controlled on home meds.  Metoprolol on hold due to bradycardia.  Negative for orthostasis. 6. Hyperlipidemia, continue on home medications  Recommendations: 1. Agree with overall therapy 2. Resume metoprolol 12.5 mg BID to avoid reflex tachycardia 3. Continue aspirin for stroke prevention. Patient defers chronic anticoagulation.  4. Instructed patient to lay down immediately if symptoms of presyncope occur again. 5. Follow-up as outpatient in the next week with Dr. Saralyn Pilar. 6. Patient instructed to call the office for recurrent syncope, at which point we will consider 30-day event monitor or implantable Linq device. 7. Recommend echocardiogram as outpatient.  Sign off for now; call with any questions.  Signed: Clabe Seal PA-C 12/31/2016, 12:53 PM

## 2017-01-14 ENCOUNTER — Encounter
Admission: RE | Disposition: A | Discharge status: Home / Self Care | Payer: Self-pay | Source: Ambulatory Visit | Attending: Cardiology

## 2017-01-14 ENCOUNTER — Ambulatory Visit
Admission: RE | Admit: 2017-01-14 | Discharge: 2017-01-14 | Disposition: A | Discharge status: Home / Self Care | Payer: Medicare Other | Source: Ambulatory Visit | Attending: Cardiology | Admitting: Cardiology

## 2017-01-14 DIAGNOSIS — I10 Essential (primary) hypertension: Secondary | ICD-10-CM | POA: Diagnosis not present

## 2017-01-14 DIAGNOSIS — Z8249 Family history of ischemic heart disease and other diseases of the circulatory system: Secondary | ICD-10-CM | POA: Insufficient documentation

## 2017-01-14 DIAGNOSIS — I2582 Chronic total occlusion of coronary artery: Secondary | ICD-10-CM | POA: Diagnosis not present

## 2017-01-14 DIAGNOSIS — Z87891 Personal history of nicotine dependence: Secondary | ICD-10-CM | POA: Diagnosis not present

## 2017-01-14 DIAGNOSIS — I2510 Atherosclerotic heart disease of native coronary artery without angina pectoris: Secondary | ICD-10-CM | POA: Insufficient documentation

## 2017-01-14 DIAGNOSIS — Z7982 Long term (current) use of aspirin: Secondary | ICD-10-CM | POA: Insufficient documentation

## 2017-01-14 DIAGNOSIS — Z95818 Presence of other cardiac implants and grafts: Secondary | ICD-10-CM

## 2017-01-14 DIAGNOSIS — R001 Bradycardia, unspecified: Secondary | ICD-10-CM | POA: Insufficient documentation

## 2017-01-14 DIAGNOSIS — E785 Hyperlipidemia, unspecified: Secondary | ICD-10-CM | POA: Insufficient documentation

## 2017-01-14 DIAGNOSIS — R55 Syncope and collapse: Secondary | ICD-10-CM

## 2017-01-14 HISTORY — PX: LOOP RECORDER INSERTION: EP1214

## 2017-01-14 HISTORY — DX: Presence of other cardiac implants and grafts: Z95.818

## 2017-01-14 SURGERY — LOOP RECORDER INSERTION
Anesthesia: LOCAL

## 2017-01-14 MED ORDER — LIDOCAINE-EPINEPHRINE (PF) 1 %-1:200000 IJ SOLN
INTRAMUSCULAR | Status: AC
  Filled 2017-01-14: qty 30

## 2017-01-14 SURGICAL SUPPLY — 2 items
LOOP REVEAL LINQSYS (Prosthesis & Implant Heart) ×2 IMPLANT
PACK LOOP INSERTION (CUSTOM PROCEDURE TRAY) ×2 IMPLANT

## 2017-01-14 NOTE — Discharge Instructions (Signed)
Return to work Monday.   Implantable Loop Recorder Placement, Care After Refer to this sheet in the next few weeks. These instructions provide you with information about caring for yourself after your procedure. Your health care provider may also give you more specific instructions. Your treatment has been planned according to current medical practices, but problems sometimes occur. Call your health care provider if you have any problems or questions after your procedure. What can I expect after the procedure? After the procedure, it is common to have:  Soreness or pain near the cut from surgery (incision).  Some swelling or bruising near the incision.  Follow these instructions at home: Medicines  Take over-the-counter and prescription medicines only as told by your health care provider.  If you were prescribed an antibiotic medicine, take it as told by your health care provider. Do not stop taking the antibiotic even if you start to feel better. Bathing  Do not take baths, swim, or use a hot tub until your health care provider approves. Ask your health care provider if you can take showers. You may only be allowed to take sponge baths for bathing. Incision care  Follow instructions from your health care provider about how to take care of your incision. Make sure you: ? Wash your hands with soap and water before you change your bandage (dressing). If soap and water are not available, use hand sanitizer. ? Change your dressing as told by your health care provider. ? Keep your dressing dry. ? Leave stitches (sutures), skin glue, or adhesive strips in place. These skin closures may need to stay in place for 2 weeks or longer. If adhesive strip edges start to loosen and curl up, you may trim the loose edges. Do not remove adhesive strips completely unless your health care provider tells you to do that.  Check your incision area every day for signs of infection. Check for: ? More redness,  swelling, or pain. ? Fluid or blood. ? Warmth. ? Pus or a bad smell. Driving  If you received a sedative, do not drive for 24 hours after the procedure.  If you did not receive a sedative, ask your health care provider when it is safe to drive. Activity  Return to your normal activities as told by your health care provider. Ask your health care provider what activities are safe for you.  Until your health care provider says it is safe: ? Do not lift anything that is heavier than 10 lb (4.5 kg). ? Do not do activities that involve lifting your arms over your head. General instructions   Follow instructions from your health care provider about how and when to use your implantable loop recorder.  Do not go through a metal detection gate, and do not let someone hold a metal detector over your chest. Show your ID card.  Do not have an MRI unless you check with your health care provider first.  Do not use any tobacco products, such as cigarettes, chewing tobacco, and e-cigarettes. Tobacco can delay healing. If you need help quitting, ask your health care provider.  Keep all follow-up visits as told by your health care provider. This is important. Contact a health care provider if:  You have more redness, swelling, or pain around your incision.  You have more fluid or blood coming from your incision.  Your incision feels warm to the touch.  You have pus or a bad smell coming from your incision.  You have a fever.  You have pain that is not relieved by your pain medicine.  You have triggered your device because of fainting (syncope) or because of a heartbeat that feels like it is racing, slow, fluttering, or skipping (palpitations). Get help right away if:  You have chest pain.  You have difficulty breathing. This information is not intended to replace advice given to you by your health care provider. Make sure you discuss any questions you have with your health care  provider. Document Released: 12/11/2014 Document Revised: 06/07/2015 Document Reviewed: 10/04/2014 Elsevier Interactive Patient Education  [employer].

## 2017-01-16 IMAGING — CT CT ABD-PEL WO/W CM
2 of 10 series · 10 of 46 positions shown, 16 images · IV contrast (iopamidol)
Comparison: None.

CLINICAL DATA: Microscopic hematuria. Scrotal and perineal pain for
3 months. Urinary frequency.

EXAM:
CT ABDOMEN AND PELVIS WITHOUT AND WITH CONTRAST
TECHNIQUE: Multidetector CT imaging of the abdomen and pelvis was performed
following the standard protocol before and following the bolus
administration of intravenous contrast.
CONTRAST:  125mL [id number] IOPAMIDOL ([id number]) INJECTION 61%

[Series 8: hematuria > 45 10 min delay · axial · delayed · 0.92mm/px · z∈[-496,-111]mm · 8 of 101 slices shown, 13 images]
[im 12/101  soft-tissue]
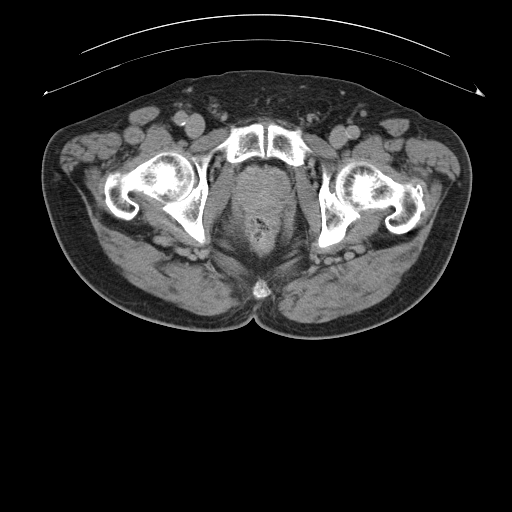
[im 12/101  bone]
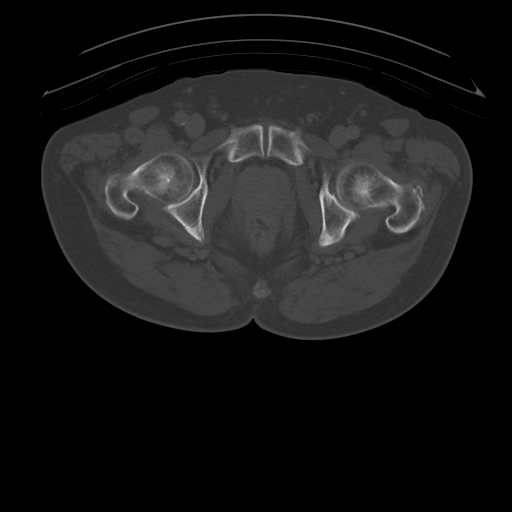
[im 23/101  soft-tissue]
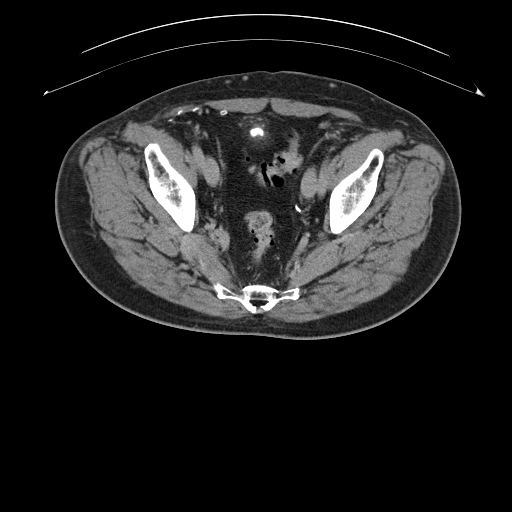
[im 34/101  soft-tissue]
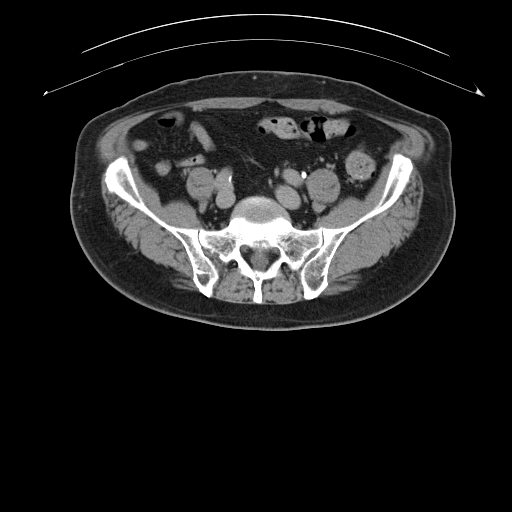
[im 45/101  soft-tissue]
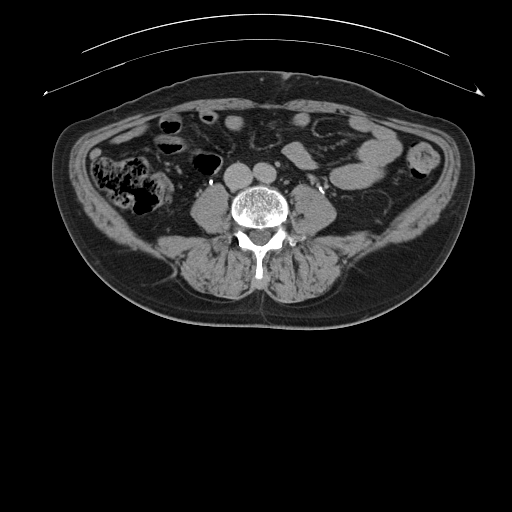
[im 56/101  soft-tissue]
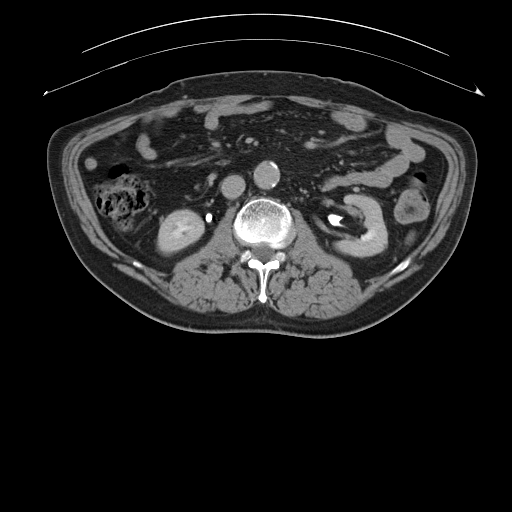
[im 56/101  lung]
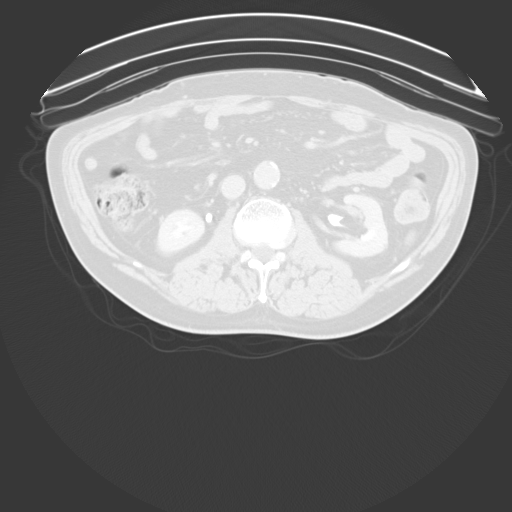
[im 67/101  soft-tissue]
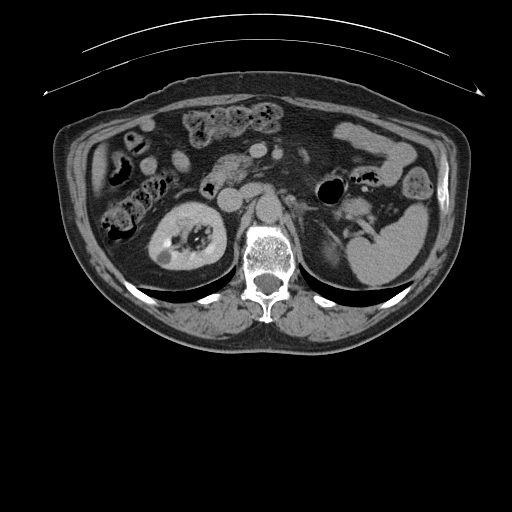
[im 67/101  lung]
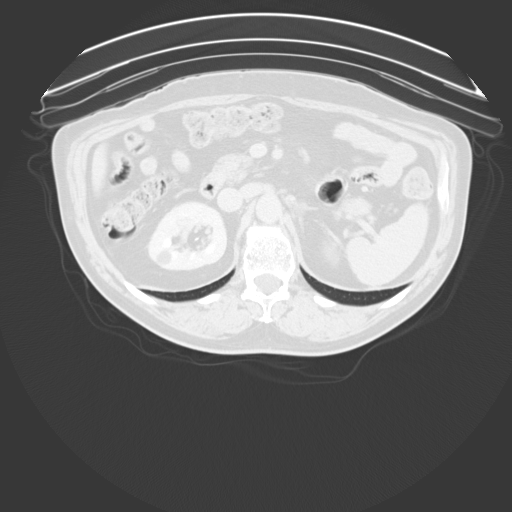
[im 78/101  soft-tissue]
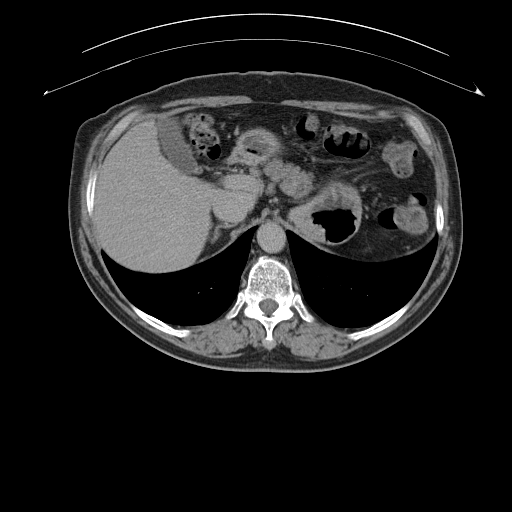
[im 78/101  lung]
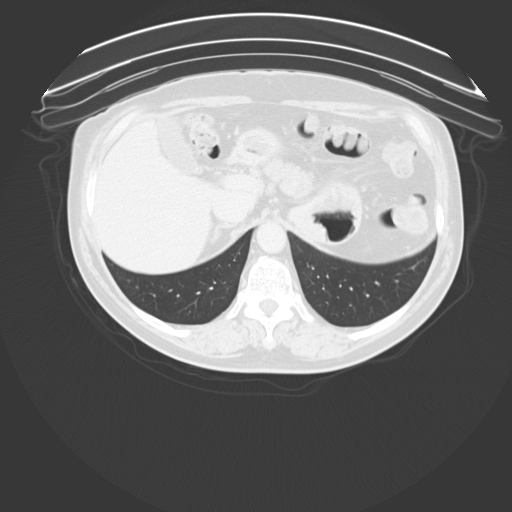
[im 89/101  soft-tissue]
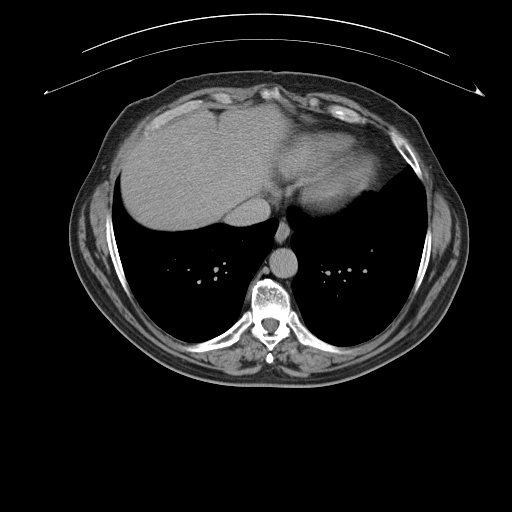
[im 89/101  lung]
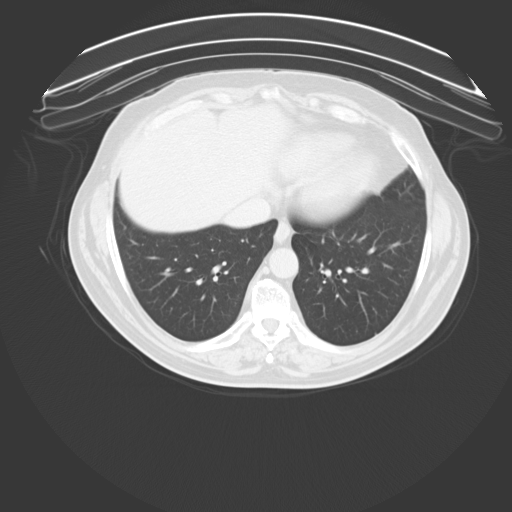

[Series 602: cor non con · coronal · non-contrast · 0.89mm/px · 2 of 138 slices shown, 3 images]
[im 46/138  soft-tissue]
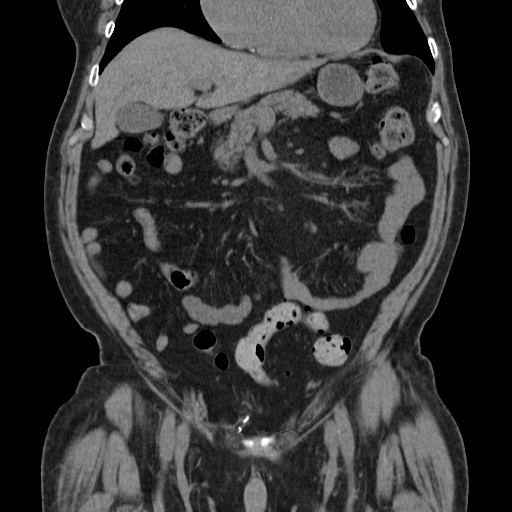
[im 46/138  bone]
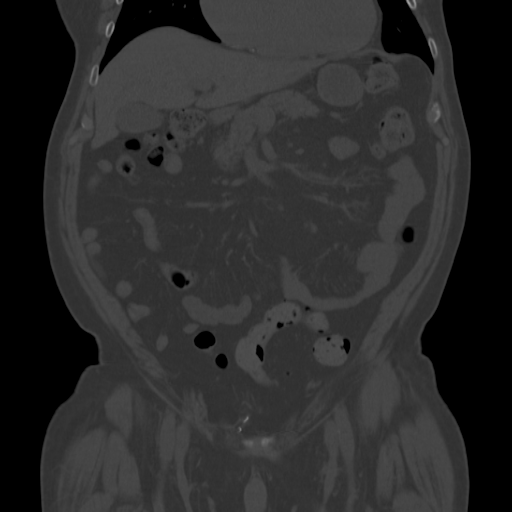
[im 92/138  soft-tissue]
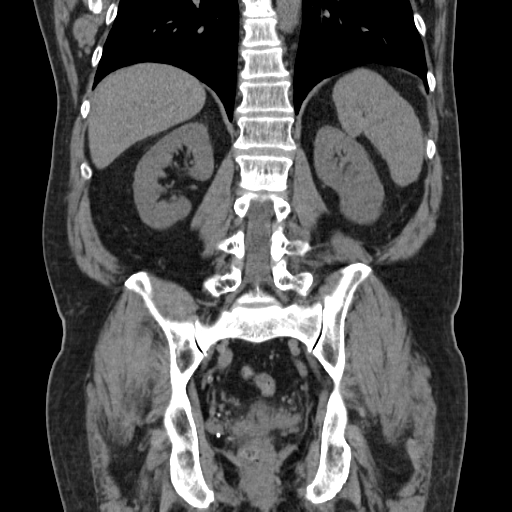

[10 of 46 positions shown; findings below may reference images not displayed]

FINDINGS: Lower Chest: No acute findings.

Hepatobiliary:  No masses identified. Gallbladder is unremarkable.

Pancreas:  No mass or inflammatory changes.

Spleen: Within normal limits in size and appearance.

Adrenals/Urinary Tract: 3 mm nonobstructive calculus seen in lower
pole of left kidney. No evidence of hydronephrosis or ureteral
calculi. Small renal cysts noted bilaterally, however no complex
cystic or solid renal masses are identified. Diffuse bladder wall
thickening is seen without evidence of focal bladder mass, most
likely due to chronic bladder outlet obstruction.

Stomach/Bowel: No evidence of obstruction, inflammatory process or
abnormal fluid collections.

Vascular/Lymphatic: No pathologically enlarged lymph nodes. No
abdominal aortic aneurysm. Aortic atherosclerosis.

Reproductive: Mildly enlarged prostate, with mass effect on bladder
base.

Other:  None.

Musculoskeletal:  No suspicious bone lesions identified.
IMPRESSION: 3 mm nonobstructive left renal calculus. No evidence of ureteral
calculi or hydronephrosis.

No radiographic evidence of urinary tract carcinoma.

Mildly enlarged prostate, with findings of chronic bladder outlet
obstruction.

## 2017-01-22 ENCOUNTER — Other Ambulatory Visit: Payer: Self-pay | Admitting: Acute Care

## 2017-01-22 DIAGNOSIS — R42 Dizziness and giddiness: Secondary | ICD-10-CM

## 2017-01-22 DIAGNOSIS — R55 Syncope and collapse: Secondary | ICD-10-CM

## 2017-01-22 DIAGNOSIS — R270 Ataxia, unspecified: Secondary | ICD-10-CM

## 2017-01-26 ENCOUNTER — Other Ambulatory Visit: Payer: Self-pay | Admitting: Acute Care

## 2017-01-26 DIAGNOSIS — I6509 Occlusion and stenosis of unspecified vertebral artery: Secondary | ICD-10-CM

## 2017-01-26 DIAGNOSIS — I671 Cerebral aneurysm, nonruptured: Secondary | ICD-10-CM

## 2017-02-02 ENCOUNTER — Ambulatory Visit
Admission: RE | Admit: 2017-02-02 | Discharge: 2017-02-02 | Disposition: A | Discharge status: Home / Self Care | Payer: Medicare Other | Source: Ambulatory Visit | Attending: Acute Care | Admitting: Acute Care

## 2017-02-02 DIAGNOSIS — I6509 Occlusion and stenosis of unspecified vertebral artery: Secondary | ICD-10-CM | POA: Diagnosis not present

## 2017-02-02 DIAGNOSIS — I671 Cerebral aneurysm, nonruptured: Secondary | ICD-10-CM | POA: Diagnosis not present

## 2017-02-16 ENCOUNTER — Other Ambulatory Visit: Payer: Self-pay | Admitting: Urology

## 2017-02-19 ENCOUNTER — Other Ambulatory Visit: Payer: Self-pay

## 2017-02-19 MED ORDER — TAMSULOSIN HCL 0.4 MG PO CAPS: 0.4000 mg | ORAL_CAPSULE | Freq: Every day | ORAL | 11 refills | Status: DC

## 2017-02-19 MED ORDER — FINASTERIDE 5 MG PO TABS: 5.0000 mg | ORAL_TABLET | Freq: Every day | ORAL | 11 refills | Status: DC

## 2017-04-01 ENCOUNTER — Other Ambulatory Visit: Payer: Medicare Other | Admitting: Urology

## 2017-05-05 ENCOUNTER — Ambulatory Visit (INDEPENDENT_AMBULATORY_CARE_PROVIDER_SITE_OTHER): Payer: Medicare Other | Admitting: Urology

## 2017-05-05 ENCOUNTER — Encounter: Payer: Self-pay | Admitting: Urology

## 2017-05-05 VITALS — BP 138/84 | HR 56 | Ht 70.0 in | Wt 215.0 lb

## 2017-05-05 DIAGNOSIS — N400 Enlarged prostate without lower urinary tract symptoms: Secondary | ICD-10-CM

## 2017-05-05 DIAGNOSIS — C679 Malignant neoplasm of bladder, unspecified: Secondary | ICD-10-CM

## 2017-05-05 LAB — URINALYSIS, COMPLETE
Bilirubin, UA: NEGATIVE
GLUCOSE, UA: NEGATIVE
Ketones, UA: NEGATIVE
Leukocytes, UA: NEGATIVE
NITRITE UA: NEGATIVE
PH UA: 5.5 (ref 5.0–7.5)
Protein, UA: NEGATIVE
Specific Gravity, UA: >1.03 — ABNORMAL HIGH (ref 1.005–1.030)
UUROB: 0.2 mg/dL (ref 0.2–1.0)

## 2017-05-05 LAB — MICROSCOPIC EXAMINATION
Bacteria, UA: NONE SEEN
WBC UA: NONE SEEN /HPF (ref 0–5)

## 2017-05-05 MED ORDER — LIDOCAINE HCL URETHRAL/MUCOSAL 2 % EX GEL
1.0000 | Freq: Once | CUTANEOUS | Status: AC
Start: 2017-05-05 — End: 2017-05-05
  Administered 2017-05-05: 1 via URETHRAL

## 2017-05-05 MED ORDER — CIPROFLOXACIN HCL 500 MG PO TABS
500.0000 mg | ORAL_TABLET | Freq: Once | ORAL | Status: AC
Start: 2017-05-05 — End: 2017-05-05
  Administered 2017-05-05: 500 mg via ORAL

## 2017-05-05 NOTE — Progress Notes (Signed)
   05/05/17  CC:  Chief Complaint  Patient presents with  . 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  He returns today for surveillance cystoscopy.  CT urogram which showed a 3 mm left lower pole stone on 12/18/15, enlarged prostate with diffuse bladder wall thickening.  He underwent TURBT on 01/02/2016 which showed a 2 mm papillary lesion just adjacent to the right UO. This was consistent with low-grade TA TCC.  He have baseline urinary symptoms including urinary frequency, urgency, and significant nocturia.  Most recent PSA 0.9 on 01/31/15, DRE enlarged, 55 cc unremarkable on 10/2015.   Started on Flomax and finasteride with well controlled urinary symptoms.  He is due for PSA/rectal exam screening today.  Blood pressure (!) 155/81, pulse 65, height 5\' 10"  (1.778 m), weight 199 lb (90.3 kg). NED. A&Ox3.   No respiratory distress   Abd soft, NT, ND Normal phallus with bilateral descended testicles Rectal: Normal sphincter tone.  Enlarged 50 cc prostate, nontender, no nodules.  Cystoscopy Procedure Note  Patient identification was confirmed, informed consent was obtained, and patient was prepped using Betadine solution.  Lidocaine jelly was administered per urethral meatus.    Preoperative abx where received prior to procedure.     Pre-Procedure: - Inspection reveals a normal caliber ureteral meatus.  Procedure: The flexible cystoscope was introduced without difficulty - No urethral strictures/lesions are present. - Enlarged prostate lobar coaptation, 5 cm prostatic length - Elevated bladder neck - Bilateral ureteral orifices identified - Bladder mucosa  reveals no additional tumors, ulcerations, or lesions other than above - No bladder stones - Mild trabeculation  Retroflexion shows slight intravesical protrusion of the prostate.  Post-Procedure: - Patient tolerated the procedure well  Assessment/ Plan:  1.  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 BPH with obstruction/lower urinary tract symptoms Continue Flomax and finasteride - doing well  rectal exam performed today, unremarkable other than benign enlargement PSA pending  3. Left nephrolithiasis 3 mm LLP stone, asymptomatic No intervention recommended, will follow clinically  Return in about 6 months (around 11/04/2017) for cysto. and will plan for annual thereafter  [name], MD

## 2017-05-06 ENCOUNTER — Telehealth: Payer: Self-pay

## 2017-05-06 LAB — PSA: Prostate Specific Ag, Serum: 0.5 ng/mL (ref 0.0–4.0)

## 2017-05-06 NOTE — Telephone Encounter (Signed)
lmom w/ lab results

## 2017-05-06 NOTE — Telephone Encounter (Signed)
-----   Message from [name], MD sent at 05/06/2017  8:00 AM EDT ----- PSA is excellent.  [name], MD

## 2017-06-15 HISTORY — PX: CARDIAC ELECTROPHYSIOLOGY STUDY AND ABLATION: SHX1294

## 2017-06-15 HISTORY — PX: ABLATION OF DYSRHYTHMIC FOCUS: SHX254

## 2017-07-09 ENCOUNTER — Encounter: Payer: Medicare Other | Attending: Nurse Practitioner | Admitting: Nurse Practitioner

## 2017-07-09 DIAGNOSIS — S0990XA Unspecified injury of head, initial encounter: Secondary | ICD-10-CM | POA: Diagnosis present

## 2017-07-09 DIAGNOSIS — G4733 Obstructive sleep apnea (adult) (pediatric): Secondary | ICD-10-CM | POA: Diagnosis not present

## 2017-07-09 DIAGNOSIS — S0101XA Laceration without foreign body of scalp, initial encounter: Secondary | ICD-10-CM | POA: Insufficient documentation

## 2017-07-09 DIAGNOSIS — I4891 Unspecified atrial fibrillation: Secondary | ICD-10-CM | POA: Insufficient documentation

## 2017-07-09 DIAGNOSIS — I2510 Atherosclerotic heart disease of native coronary artery without angina pectoris: Secondary | ICD-10-CM | POA: Diagnosis not present

## 2017-07-09 DIAGNOSIS — W19XXXA Unspecified fall, initial encounter: Secondary | ICD-10-CM | POA: Diagnosis not present

## 2017-07-09 DIAGNOSIS — I10 Essential (primary) hypertension: Secondary | ICD-10-CM | POA: Diagnosis not present

## 2017-07-09 DIAGNOSIS — I499 Cardiac arrhythmia, unspecified: Secondary | ICD-10-CM | POA: Diagnosis not present

## 2017-07-11 NOTE — Progress Notes (Signed)
[name], [name] ([id number]) Visit Report for 07/09/2017 Abuse/Suicide Risk Screen Details Patient Name: [name], [name] Date of Service: 07/09/2017 8:00 AM Medical Record Number: [id number] Patient Account Number: [id number] Date of Birth/Sex: 1950-06-08 (66 y.o. Male) Treating RN: Montey Hora Primary Care Tyquez Hollibaugh: Raechel Ache, DAVID Other Clinician: Referring Esaiah Wanless: Raechel Ache, DAVID Treating Josejuan Hoaglin/Extender: Cathie Olden in Treatment: 0 Abuse/Suicide Risk Screen Items Answer ABUSE/SUICIDE RISK SCREEN: Has anyone close to you tried to hurt or harm you recentlyo No Do you feel uncomfortable with anyone in your familyo No Has anyone forced you do things that you didnot want to doo No Do you have any thoughts of harming yourselfo No Patient displays signs or symptoms of abuse and/or neglect. No Electronic Signature(s) Signed: 07/09/2017 4:38:21 PM By: Montey Hora Entered By: Montey Hora on 07/09/2017 08:15:04 Giesler, [name] ([id number]) -------------------------------------------------------------------------------- Activities of Daily Living Details Patient Name: [name] Date of Service: 07/09/2017 8:00 AM Medical Record Number: [id number] Patient Account Number: [id number] Date of Birth/Sex: September 17, 1950 (67 y.o. Male) Treating RN: Montey Hora Primary Care Belinda Bringhurst: Raechel Ache, DAVID Other Clinician: Referring Perline Awe: Raechel Ache, DAVID Treating Tzirel Leonor/Extender: Cathie Olden in Treatment: 0 Activities of Daily Living Items Answer Activities of Daily Living (Please select one for each item) Drive Automobile Completely Able Take Medications Completely Able Use Telephone Completely Able Care for Appearance Completely Able Use Toilet Completely Able Bath / Shower Completely Able Dress Self Completely Able Feed Self Completely Able Walk Completely Able Get In / Out Bed Completely [place] for Self Completely Able Electronic Signature(s) Signed: 07/09/2017 4:38:21 PM By: Montey Hora Entered By: Montey Hora on 07/09/2017 08:15:24 Anfinson, [name] ([id number]) -------------------------------------------------------------------------------- Education Assessment Details Patient Name: [name] Date of Service: 07/09/2017 8:00 AM Medical Record Number: [id number] Patient Account Number: [id number] Date of Birth/Sex: 1950/03/23 (67 y.o. Male) Treating RN: Montey Hora Primary Care Yecenia Dalgleish: Raechel Ache, DAVID Other Clinician: Referring Daegan Arizmendi: Raechel Ache, DAVID Treating Azelea Seguin/Extender: Cathie Olden in Treatment: 0 Primary Learner Assessed: Patient Learning Preferences/Education Level/Primary Language Learning Preference: Explanation, Demonstration Highest Education Level: College or Above Preferred Language: English Cognitive Barrier Assessment/Beliefs Language Barrier: No Translator Needed: No Memory Deficit: No Emotional Barrier: No Cultural/Religious Beliefs Affecting Medical Care: No Physical Barrier Assessment Impaired Vision: No Impaired Hearing: No Decreased Hand dexterity: No Knowledge/Comprehension Assessment Knowledge Level: Medium Comprehension Level: Medium Ability to understand written Medium instructions: Ability to understand verbal Medium instructions: Motivation Assessment Anxiety Level: Calm Cooperation: Cooperative Education Importance: Acknowledges Need Interest in Health Problems: Asks Questions Perception: Coherent Willingness to Engage in Self- Medium Management Activities: Readiness to Engage in Self- Medium Management Activities: Electronic Signature(s) Signed: 07/09/2017 4:38:21 PM By: Montey Hora Entered By: Montey Hora on 07/09/2017 08:15:45 Carfagno, Odes Carlean Jews ([id number]) -------------------------------------------------------------------------------- Fall Risk Assessment Details Patient  Name: [name] Date of Service: 07/09/2017 8:00 AM Medical Record Number: [id number] Patient Account Number: [id number] Date of Birth/Sex: 11/24/1950 (67 y.o. Male) Treating RN: Montey Hora Primary Care Jessamyn Watterson: Raechel Ache, DAVID Other Clinician: Referring Berit Raczkowski: Raechel Ache, DAVID Treating Shelbia Scinto/Extender: Cathie Olden in Treatment: 0 Fall Risk Assessment Items Have you had 2 or more falls in the last 12 monthso 0 Yes Have you had any fall that resulted in injury in the last 12 monthso 0 Yes FALL RISK ASSESSMENT: History of falling - immediate or within 3 months 25 Yes Secondary diagnosis 0 No Ambulatory aid None/bed rest/wheelchair/nurse 0 Yes Crutches/cane/walker 0 No Furniture 0 No IV Access/Saline  Lock 0 No Gait/Training Normal/bed rest/immobile 0 Yes Weak 0 No Impaired 0 No Mental Status Oriented to own ability 0 Yes Electronic Signature(s) Signed: 07/09/2017 4:38:21 PM By: Montey Hora Entered By: Montey Hora on 07/09/2017 08:16:17 [place], [place] ([id number]) -------------------------------------------------------------------------------- Foot Assessment Details Patient Name: [name] Date of Service: 07/09/2017 8:00 AM Medical Record Number: [id number] Patient Account Number: [id number] Date of Birth/Sex: 06-Jan-1951 (67 y.o. Male) Treating RN: Montey Hora Primary Care Porshea Janowski: Raechel Ache, DAVID Other Clinician: Referring Baron Parmelee: Raechel Ache, DAVID Treating Shawna Wearing/Extender: Cathie Olden in Treatment: 0 Foot Assessment Items Site Locations + = Sensation present, - = Sensation absent, C = Callus, U = Ulcer R = Redness, W = Warmth, M = Maceration, PU = Pre-ulcerative lesion F = Fissure, S = Swelling, D = Dryness Assessment Right: Left: Other Deformity: No No Prior Foot Ulcer: No No Prior Amputation: No No Charcot Joint: No No Ambulatory Status: Gait: Electronic Signature(s) Signed: 07/09/2017 4:38:21 PM By: Montey Hora Entered By:  Montey Hora on 07/09/2017 08:16:39 Zajicek, [name] ([id number]) -------------------------------------------------------------------------------- Nutrition Risk Assessment Details Patient Name: [name] Date of Service: 07/09/2017 8:00 AM Medical Record Number: [id number] Patient Account Number: [id number] Date of Birth/Sex: 1950/09/19 (67 y.o. Male) Treating RN: Montey Hora Primary Care Anaja Monts: Raechel Ache, DAVID Other Clinician: Referring Jaidy Cottam: Raechel Ache, DAVID Treating Daniela Siebers/Extender: Cathie Olden in Treatment: 0 Height (in): Weight (lbs): Body Mass Index (BMI): Nutrition Risk Assessment Items NUTRITION RISK SCREEN: I have an illness or condition that made me change the kind and/or amount of 0 No food I eat I eat fewer than two meals per day 0 No I eat few fruits and vegetables, or milk products 0 No I have three or more drinks of beer, liquor or wine almost every day 0 No I have tooth or mouth problems that make it hard for me to eat 0 No I don't always have enough money to buy the food I need 0 No I eat alone most of the time 0 No I take three or more different prescribed or over-the-counter drugs a day 1 Yes Without wanting to, I have lost or gained 10 pounds in the last six months 0 No I am not always physically able to shop, cook and/or feed myself 0 No Nutrition Protocols Good Risk Protocol 0 No interventions needed Moderate Risk Protocol Electronic Signature(s) Signed: 07/09/2017 4:38:21 PM By: Montey Hora Entered By: Montey Hora on 07/09/2017 08:16:36

## 2017-07-12 HISTORY — PX: LEFT ATRIAL APPENDAGE OCCLUSION: SHX173A

## 2017-07-14 NOTE — Progress Notes (Signed)
[name], [name] ([id number]) Visit Report for 07/09/2017 Chief Complaint Document Details Patient Name: [name], [name] Date of Service: 07/09/2017 8:00 AM Medical Record Number: [id number] Patient Account Number: [id number] Date of Birth/Sex: [birth year] (67 y.o. Male) Treating RN: Ahmed Prima Primary Care Provider: Raechel Ache, DAVID Other Clinician: Referring Provider: Raechel Ache, DAVID Treating Provider/Extender: Cathie Olden in Treatment: 0 Information Obtained from: Patient Chief Complaint He is here for parietal scalp wound Electronic Signature(s) Signed: 07/09/2017 10:05:19 AM By: Lawanda Cousins Entered By: Lawanda Cousins on 07/09/2017 10:05:19 [name], [name] ([id number]) -------------------------------------------------------------------------------- Debridement Details Patient Name: [name] Date of Service: 07/09/2017 8:00 AM Medical Record Number: [id number] Patient Account Number: [id number] Date of Birth/Sex: [birth year] (67 y.o. Male) Treating RN: Ahmed Prima Primary Care Provider: Raechel Ache, DAVID Other Clinician: Referring Provider: Raechel Ache, DAVID Treating Provider/Extender: Cathie Olden in Treatment: 0 Debridement Performed for Wound #1 Right Head - Parietal Assessment: Performed By: Physician Lawanda Cousins, NP Debridement Type: Debridement Pre-procedure Verification/Time Yes - 08:38 Out Taken: Start Time: 08:38 Pain Control: Lidocaine 4% Topical Solution Total Area Debrided (L x W): 7 (cm) x 2.8 (cm) = 19.6 (cm) Tissue and other material Viable, Subcutaneous, Hyper-granulation debrided: Level: Skin/Subcutaneous Tissue Debridement Description: Excisional Instrument: Blade, Forceps Bleeding: Moderate Hemostasis Achieved: Silver Nitrate End Time: 09:15 Procedural Pain: 0 Post Procedural Pain: 0 Response to Treatment: Procedure was tolerated well Level of Consciousness: Awake and Alert Post Procedure Vitals: Temperature: 98.3 Pulse:  53 Respiratory Rate: 16 Blood Pressure: Systolic Blood Pressure: 878 Diastolic Blood Pressure: 75 Post Debridement Measurements of Total Wound Length: (cm) 2.5 Width: (cm) 1 Depth: (cm) 0.1 Volume: (cm) 0.196 Character of Wound/Ulcer Post Debridement: Requires Further Debridement Post Procedure Diagnosis Same as Pre-procedure Electronic Signature(s) Signed: 07/09/2017 10:04:51 AM By: Lawanda Cousins Signed: 07/13/2017 5:11:46 PM By: Alric Quan Entered By: Lawanda Cousins on 07/09/2017 10:04:50 [name], [name] ([id number]) -------------------------------------------------------------------------------- HPI Details Patient Name: [name] Date of Service: 07/09/2017 8:00 AM Medical Record Number: [id number] Patient Account Number: [id number] Date of Birth/Sex: [birth year] (67 y.o. Male) Treating RN: Ahmed Prima Primary Care Provider: Raechel Ache, DAVID Other Clinician: Referring Provider: Raechel Ache, DAVID Treating Provider/Extender: Cathie Olden in Treatment: 0 History of Present Illness HPI Description: 07/09/17- He is here for initial evaluation for a scalp wound. He reports a fall with laceration in December 30, 2016, stapled close in the ER; his PCP removed staples January 15, 2017. He states there was a scab over the area when staples were removed; he was not instructed on any specific topical treatment. He has continued to have drainage and presents today with a large area of crusted drainage integrated into his hair. All of this was cleaned and trimmed away to reveal a large area of hypergranular tissue. This was treated with debridement and chemical cautery for hemostasis. He will continue with topical antibiotic ointment twice daily and follow up in two weeks. He has been advised, multiple times, to have area covered with gauze and secure with cap/hat when going out of the home. Electronic Signature(s) Signed: 07/09/2017 12:50:25 PM By: Lawanda Cousins Previous  Signature: 07/09/2017 10:24:00 AM Version By: Lawanda Cousins Entered By: Lawanda Cousins on 07/09/2017 12:50:25 [name], [name] ([id number]) -------------------------------------------------------------------------------- Physical Exam Details Patient Name: [name] Date of Service: 07/09/2017 8:00 AM Medical Record Number: [id number] Patient Account Number: [id number] Date of Birth/Sex: 1950/10/06 (67 y.o. Male) Treating RN: Ahmed Prima Primary Care Provider: Raechel Ache, DAVID Other Clinician: Referring Provider: Raechel Ache, DAVID Treating Provider/Extender: Cathie Olden in Treatment: 0  Respiratory respirations are even and unlabored. clear throughout. Cardiovascular s1 s2 regular rate and rhythm. Musculoskeletal ambulates with no assistive devices. Psychiatric oriented x4. calm, cooperative. Electronic Signature(s) Signed: 07/09/2017 12:51:03 PM By: Lawanda Cousins Entered By: Lawanda Cousins on 07/09/2017 12:51:02 [name] ([id number]) -------------------------------------------------------------------------------- Physician Orders Details Patient Name: [name] Date of Service: 07/09/2017 8:00 AM Medical Record Number: [id number] Patient Account Number: [id number] Date of Birth/Sex: 11-Jan-1951 (67 y.o. Male) Treating RN: Ahmed Prima Primary Care Provider: Raechel Ache, DAVID Other Clinician: Referring Provider: Raechel Ache, DAVID Treating Provider/Extender: Cathie Olden in Treatment: 0 Verbal / Phone Orders: Yes ClinicianCarolyne Fiscal, Debi Read Back and Verified: Yes Diagnosis Coding Wound Cleansing Wound #1 Right Head - Parietal o Clean wound with Normal Saline. o Cleanse wound with mild soap and water o May Shower, gently pat wound dry prior to applying new dressing. Anesthetic (add to Medication List) Wound #1 Right Head - Parietal o Topical Lidocaine 4% cream applied to wound bed prior to debridement (In Clinic Only). o Benzocaine Topical  Anesthetic Spray applied to wound bed prior to debridement (In Clinic Only). Primary Wound Dressing Wound #1 Right Head - Parietal o Other: - Bacitracin in office Polysporin at home Secondary Dressing Wound #1 Right Head - Parietal o Dry Gauze - when needed Dressing Change Frequency Wound #1 Right Head - Parietal o Change dressing twice daily. Follow-up Appointments Wound #1 Right Head - Parietal o Return Appointment in 2 weeks. Off-Loading Wound #1 Right Head - Parietal o Other: - do not put pressure on area Additional Orders / Instructions Wound #1 Right Head - Parietal o Vitamin A; Vitamin C, Zinc o Increase protein intake. Patient Medications Allergies: No Known Allergies Notifications Medication Indication Start End [name], [name]. ([id number]) Notifications Medication Indication Start End lidocaine DOSE 1 - topical 4 % cream - 1 cream topical Electronic Signature(s) Signed: 07/09/2017 9:32:27 PM By: Lawanda Cousins Signed: 07/13/2017 5:11:46 PM By: Alric Quan Entered By: Alric Quan on 07/09/2017 09:22:09 [name], [name] ([id number]) -------------------------------------------------------------------------------- Prescription 07/09/2017 Patient Name: [name] Provider: Lawanda Cousins NP Date of Birth: 1950/05/12 NPI#: [id number] Sex: Male DEA#: [id number] Phone #: [phone] License #: Patient Address: [facility] [place], [place] [address] [address], [place], [place] [address] [phone] Allergies No Known Allergies Medication Medication: Route: Strength: Form: lidocaine 4 % topical cream topical 4% cream Class: TOPICAL LOCAL ANESTHETICS Dose: Frequency / Time: Indication: 1 1 cream topical Number of Refills: Number of Units: 0 Generic Substitution: Start Date: End Date: One Time Use: Substitution Permitted No Note to  Pharmacy: Signature(s): Date(s): Electronic Signature(s) Signed: 07/09/2017 9:32:27 PM By: Lawanda Cousins Signed: 07/13/2017 5:11:46 PM By: Alric Quan Entered By: Alric Quan on 07/09/2017 09:22:10 [place], [name] ([id number]) --------------------------------------------------------------------------------  Problem List Details Patient Name: [name] Date of Service: 07/09/2017 8:00 AM Medical Record Number: [id number] Patient Account Number: [id number] Date of Birth/Sex: 09-28-1950 (67 y.o. Male) Treating RN: Ahmed Prima Primary Care Provider: Raechel Ache, DAVID Other Clinician: Referring Provider: Raechel Ache, DAVID Treating Provider/Extender: Cathie Olden in Treatment: 0 Active Problems ICD-10 Evaluated Encounter Code Description Active Date Today Diagnosis S01.01XS Laceration without foreign body of scalp, sequela 07/09/2017 No Yes Inactive Problems Resolved Problems Electronic Signature(s) Signed: 07/09/2017 9:37:56 AM By: Lawanda Cousins Entered By: Lawanda Cousins on 07/09/2017 09:37:55 [name], [name] ([id number]) -------------------------------------------------------------------------------- Progress Note Details Patient Name: [name] Date of Service: 07/09/2017 8:00 AM Medical Record Number:  [id number] Patient Account Number: [id number] Date of Birth/Sex: [birth year] (67 y.o. Male) Treating RN: Ahmed Prima Primary Care Provider: Raechel Ache, DAVID Other Clinician: Referring Provider: Raechel Ache, DAVID Treating Provider/Extender: Cathie Olden in Treatment: 0 Subjective Chief Complaint Information obtained from Patient He is here for parietal scalp wound History of Present Illness (HPI) 07/09/17- He is here for initial evaluation for a scalp wound. He reports a fall with laceration in December 30, 2016, stapled close in the ER; his PCP removed staples January 15, 2017. He states there was a scab over the area when staples were removed; he was not  instructed on any specific topical treatment. He has continued to have drainage and presents today with a large area of crusted drainage integrated into his hair. All of this was cleaned and trimmed away to reveal a large area of hypergranular tissue. This was treated with debridement and chemical cautery for hemostasis. He will continue with topical antibiotic ointment twice daily and follow up in two weeks. He has been advised, multiple times, to have area covered with gauze and secure with cap/hat when going out of the home. Wound History Patient presents with 1 open wound that has been present for approximately 6 months. Patient has been treating wound in the following manner: open to air. Laboratory tests have not been performed in the last month. Patient reportedly has not tested positive for an antibiotic resistant organism. Patient reportedly has not tested positive for osteomyelitis. Patient reportedly has not had testing performed to evaluate circulation in the legs. Patient History Information obtained from Patient. Allergies No Known Allergies Family History Cancer - Father, Diabetes - Mother, Heart Disease - Paternal Grandparents, Hypertension - Paternal Grandparents, Lung Disease - Father, No family history of Hereditary Spherocytosis, Kidney Disease, Seizures, Stroke, Thyroid Problems, Tuberculosis. Social History Never smoker, Marital Status - Married, Alcohol Use - Rarely, Drug Use - No History, Caffeine Use - Daily. Medical History Eyes Denies history of Cataracts, Glaucoma, Optic Neuritis Ear/Nose/Mouth/Throat Denies history of Chronic sinus problems/congestion, Middle ear problems Hematologic/Lymphatic Denies history of Anemia, Hemophilia, Human Immunodeficiency Virus, Lymphedema, Sickle Cell Disease Respiratory Patient has history of Sleep Apnea - CPAP Denies history of Aspiration, Asthma, Chronic Obstructive Pulmonary Disease (COPD), Pneumothorax,  Tuberculosis Cardiovascular [name], [name] ([id number]) Patient has history of Arrhythmia - a fib, Coronary Artery Disease, Hypertension Denies history of Angina, Congestive Heart Failure, Deep Vein Thrombosis, Hypotension, Myocardial Infarction, Peripheral Arterial Disease, Peripheral Venous Disease, Phlebitis, Vasculitis Gastrointestinal Denies history of Cirrhosis , Colitis, Crohn s, Hepatitis A, Hepatitis B, Hepatitis C Endocrine Denies history of Type I Diabetes, Type II Diabetes Genitourinary Denies history of End Stage Renal Disease Immunological Denies history of Lupus Erythematosus, Raynaud s, Scleroderma Integumentary (Skin) Denies history of History of Burn, History of pressure wounds Musculoskeletal Denies history of Gout, Rheumatoid Arthritis, Osteoarthritis, Osteomyelitis Neurologic Denies history of Dementia, Neuropathy, Quadriplegia, Paraplegia, Seizure Disorder Oncologic Denies history of Received Chemotherapy, Received Radiation Psychiatric Denies history of Anorexia/bulimia, Confinement Anxiety Review of Systems (ROS) Constitutional Symptoms (General Health) The patient has no complaints or symptoms. Eyes Denies complaints or symptoms of Dry Eyes, Vision Changes, Glasses / Contacts. Ear/Nose/Mouth/Throat Denies complaints or symptoms of Difficult clearing ears, Sinusitis. Hematologic/Lymphatic Denies complaints or symptoms of Bleeding / Clotting Disorders, Human Immunodeficiency Virus. Respiratory Denies complaints or symptoms of Chronic or frequent coughs, Shortness of Breath. Cardiovascular Denies complaints or symptoms of Chest pain, LE edema. Gastrointestinal Denies complaints or symptoms of Frequent diarrhea, Nausea, Vomiting. Endocrine Denies complaints or symptoms of Hepatitis,  Thyroid disease, Polydypsia (Excessive Thirst). Genitourinary Denies complaints or symptoms of Kidney failure/ Dialysis, Incontinence/dribbling. Immunological Denies  complaints or symptoms of Hives, Itching. Integumentary (Skin) Complains or has symptoms of Wounds. Denies complaints or symptoms of Bleeding or bruising tendency, Breakdown, Swelling. Musculoskeletal Denies complaints or symptoms of Muscle Pain, Muscle Weakness. Neurologic Denies complaints or symptoms of Numbness/parasthesias, Focal/Weakness. Psychiatric Denies complaints or symptoms of Anxiety, Claustrophobia. [name], [name] ([id number]) Objective Constitutional Vitals Time Taken: 8:22 AM, Height: 70 in, Source: Measured, Weight: 215 lbs, Source: Measured, BMI: 30.8, Temperature: 98.3 F, Pulse: 53 bpm, Respiratory Rate: 16 breaths/min, Blood Pressure: 129/75 mmHg. Respiratory respirations are even and unlabored. clear throughout. Cardiovascular s1 s2 regular rate and rhythm. Musculoskeletal ambulates with no assistive devices. Psychiatric oriented x4. calm, cooperative. Integumentary (Hair, Skin) Wound #1 status is Open. Original cause of wound was Trauma. The wound is located on the Right Head - Occipital. The wound measures 7cm length x 2.8cm width x 0.1cm depth; 15.394cm^2 area and 1.539cm^3 volume. There is no tunneling or undermining noted. There is a small amount of sanguinous drainage noted. The wound margin is indistinct and nonvisible. There is no granulation within the wound bed. There is a large (67-100%) amount of necrotic tissue within the wound bed including Eschar. The periwound skin appearance did not exhibit: Callus, Crepitus, Excoriation, Induration, Rash, Scarring, Dry/Scaly, Maceration, Atrophie Blanche, Cyanosis, Ecchymosis, Hemosiderin Staining, Mottled, Pallor, Rubor, Erythema. The periwound has tenderness on palpation. Assessment Active Problems ICD-10 Laceration without foreign body of scalp, sequela no indiciaton of infection; no culture obtained essentially no depth; no palpable bone he was encouraged to call the clinic office or report to urgent  care with any chills, fever, change in appearance and/or drainage; I expect this to be healed by follow up appointment Procedures Wound #1 Pre-procedure diagnosis of Wound #1 is a Trauma, Other located on the Right Head - Parietal . There was a Excisional [name], [name] ([id number]) Skin/Subcutaneous Tissue Debridement with a total area of 19.6 sq cm performed by Lawanda Cousins, NP. With the following instrument(s): Blade, and Forceps to remove Viable tissue/material. Material removed includes Subcutaneous Tissue and Hyper-granulation and after achieving pain control using Lidocaine 4% Topical Solution. No specimens were taken. A time out was conducted at 08:38, prior to the start of the procedure. A Moderate amount of bleeding was controlled with Silver Nitrate. The procedure was tolerated well with a pain level of 0 throughout and a pain level of 0 following the procedure. Patient s Level of Consciousness post procedure was recorded as Awake and Alert. Post Debridement Measurements: 2.5cm length x 1cm width x 0.1cm depth; 0.196cm^3 volume. Character of Wound/Ulcer Post Debridement requires further debridement. Post procedure Diagnosis Wound #1: Same as Pre-Procedure Plan Wound Cleansing: Wound #1 Right Head - Parietal: Clean wound with Normal Saline. Cleanse wound with mild soap and water May Shower, gently pat wound dry prior to applying new dressing. Anesthetic (add to Medication List): Wound #1 Right Head - Parietal: Topical Lidocaine 4% cream applied to wound bed prior to debridement (In Clinic Only). Benzocaine Topical Anesthetic Spray applied to wound bed prior to debridement (In Clinic Only). Primary Wound Dressing: Wound #1 Right Head - Parietal: Other: - Bacitracin in office Polysporin at home Secondary Dressing: Wound #1 Right Head - Parietal: Dry Gauze - when needed Dressing Change Frequency: Wound #1 Right Head - Parietal: Change dressing twice daily. Follow-up  Appointments: Wound #1 Right Head - Parietal: Return Appointment in 2 weeks. Off-Loading: Wound #1  Right Head - Parietal: Other: - do not put pressure on area Additional Orders / Instructions: Wound #1 Right Head - Parietal: Vitamin A; Vitamin C, Zinc Increase protein intake. The following medication(s) was prescribed: lidocaine topical 4 % cream 1 1 cream topical was prescribed at facility Electronic Signature(s) Signed: 07/09/2017 1:07:40 PM By: Lawanda Cousins Previous Signature: 07/09/2017 12:52:32 PM Version By: Lawanda Cousins Entered By: Lawanda Cousins on 07/09/2017 13:07:40 [name], [name] ([id number]) -------------------------------------------------------------------------------- ROS/PFSH Details Patient Name: [name] Date of Service: 07/09/2017 8:00 AM Medical Record Number: [id number] Patient Account Number: [id number] Date of Birth/Sex: 08/14/1950 (67 y.o. Male) Treating RN: Montey Hora Primary Care Provider: Raechel Ache, DAVID Other Clinician: Referring Provider: Raechel Ache, DAVID Treating Provider/Extender: Cathie Olden in Treatment: 0 Information Obtained From Patient Wound History Do you currently have one or more open woundso Yes How many open wounds do you currently haveo 1 Approximately how long have you had your woundso 6 months How have you been treating your wound(s) until nowo open to air Has your wound(s) ever healed and then re-openedo No Have you had any lab work done in the past montho No Have you tested positive for an antibiotic resistant organism (MRSA, VRE)o No Have you tested positive for osteomyelitis (bone infection)o No Have you had any tests for circulation on your legso No Constitutional Symptoms (General Health) Complaints and Symptoms: No Complaints or Symptoms Complaints and Symptoms: Negative for: Fatigue; Fever; Chills; Marked Weight Change Eyes Complaints and Symptoms: Negative for: Dry Eyes; Vision Changes; Glasses /  Contacts Medical History: Negative for: Cataracts; Glaucoma; Optic Neuritis Ear/Nose/Mouth/Throat Complaints and Symptoms: Negative for: Difficult clearing ears; Sinusitis Medical History: Negative for: Chronic sinus problems/congestion; Middle ear problems Hematologic/Lymphatic Complaints and Symptoms: Negative for: Bleeding / Clotting Disorders; Human Immunodeficiency Virus Medical History: Negative for: Anemia; Hemophilia; Human Immunodeficiency Virus; Lymphedema; Sickle Cell Disease Respiratory Complaints and Symptoms: Negative for: Chronic or frequent coughs; Shortness of Breath [name], [name] ([id number]) Medical History: Positive for: Sleep Apnea - CPAP Negative for: Aspiration; Asthma; Chronic Obstructive Pulmonary Disease (COPD); Pneumothorax; Tuberculosis Cardiovascular Complaints and Symptoms: Negative for: Chest pain; LE edema Medical History: Positive for: Arrhythmia - a fib; Coronary Artery Disease; Hypertension Negative for: Angina; Congestive Heart Failure; Deep Vein Thrombosis; Hypotension; Myocardial Infarction; Peripheral Arterial Disease; Peripheral Venous Disease; Phlebitis; Vasculitis Gastrointestinal Complaints and Symptoms: Negative for: Frequent diarrhea; Nausea; Vomiting Medical History: Negative for: Cirrhosis ; Colitis; Crohnos; Hepatitis A; Hepatitis B; Hepatitis C Endocrine Complaints and Symptoms: Negative for: Hepatitis; Thyroid disease; Polydypsia (Excessive Thirst) Medical History: Negative for: Type I Diabetes; Type II Diabetes Genitourinary Complaints and Symptoms: Negative for: Kidney failure/ Dialysis; Incontinence/dribbling Medical History: Negative for: End Stage Renal Disease Immunological Complaints and Symptoms: Negative for: Hives; Itching Medical History: Negative for: Lupus Erythematosus; Raynaudos; Scleroderma Integumentary (Skin) Complaints and Symptoms: Positive for: Wounds Negative for: Bleeding or bruising tendency;  Breakdown; Swelling Medical History: Negative for: History of Burn; History of pressure wounds Musculoskeletal Complaints and Symptoms: Negative for: Muscle Pain; Muscle Weakness [name], [name] ([id number]) Medical History: Negative for: Gout; Rheumatoid Arthritis; Osteoarthritis; Osteomyelitis Neurologic Complaints and Symptoms: Negative for: Numbness/parasthesias; Focal/Weakness Medical History: Negative for: Dementia; Neuropathy; Quadriplegia; Paraplegia; Seizure Disorder Psychiatric Complaints and Symptoms: Negative for: Anxiety; Claustrophobia Medical History: Negative for: Anorexia/bulimia; Confinement Anxiety Oncologic Medical History: Negative for: Received Chemotherapy; Received Radiation Immunizations Pneumococcal Vaccine: Received Pneumococcal Vaccination: Yes Immunization Notes: up to date Implantable Devices Family and Social History Cancer: Yes - Father; Diabetes: Yes - Mother; Heart Disease: Yes -  Paternal Grandparents; Hereditary Spherocytosis: No; Hypertension: Yes - Paternal Grandparents; Kidney Disease: No; Lung Disease: Yes - Father; Seizures: No; Stroke: No; Thyroid Problems: No; Tuberculosis: No; Never smoker; Marital Status - Married; Alcohol Use: Rarely; Drug Use: No History; Caffeine Use: Daily; Financial Concerns: No; Food, Clothing or Shelter Needs: No; Support System Lacking: No; Transportation Concerns: No; Advanced Directives: No; Patient does not want information on Advanced Directives Electronic Signature(s) Signed: 07/09/2017 4:38:21 PM By: Montey Hora Signed: 07/09/2017 9:32:27 PM By: Lawanda Cousins Entered By: Montey Hora on 07/09/2017 08:21:16 Totzke, Orton Carlean Jews ([id number]) -------------------------------------------------------------------------------- [place] Details Patient Name: [name] Date of Service: 07/09/2017 Medical Record Number: [id number] Patient Account Number: [id number] Date of Birth/Sex: 02-Mar-1950 (67 y.o.  Male) Treating RN: Ahmed Prima Primary Care Provider: Raechel Ache, DAVID Other Clinician: Referring Provider: Raechel Ache, DAVID Treating Provider/Extender: Cathie Olden in Treatment: 0 Diagnosis Coding ICD-10 Codes Code Description S01.01XS Laceration without foreign body of scalp, sequela Facility Procedures CPT4 Code: [id number] Description: [phone] - WOUND CARE VISIT-LEV 2 EST PT Modifier: Quantity: 1 CPT4 Code: [id number] Description: 14481 - DEB SUBQ TISSUE 20 SQ CM/< ICD-10 Diagnosis Description S01.01XS Laceration without foreign body of scalp, sequela Modifier: Quantity: 1 Physician Procedures CPT4 Code: [id number] Description: WC PHYS LEVEL 3 o NEW PT ICD-10 Diagnosis Description S01.01XS Laceration without foreign body of scalp, sequela Modifier: Quantity: 1 CPT4 Code: [id number] Description: 58850 - WC PHYS SUBQ TISS 20 SQ CM ICD-10 Diagnosis Description S01.01XS Laceration without foreign body of scalp, sequela Modifier: Quantity: 1 Electronic Signature(s) Signed: 07/09/2017 1:08:12 PM By: Lawanda Cousins Entered By: Lawanda Cousins on 07/09/2017 13:08:12

## 2017-07-14 NOTE — Progress Notes (Signed)
STEVIE, CHARTER ([id number]) Visit Report for 07/09/2017 Allergy List Details Patient Name: MATTHEU, BRODERSEN Date of Service: 07/09/2017 8:00 AM Medical Record Number: [id number] Patient Account Number: [id number] Date of Birth/Sex: [birth year] (67 y.o. Male) Treating RN: Montey Hora Primary Care Lavanya Roa: Raechel Ache, DAVID Other Clinician: Referring Aritza Brunet: Raechel Ache, DAVID Treating Rody Keadle/Extender: Lawanda Cousins Weeks in Treatment: 0 Allergies Active Allergies No Known Allergies Allergy Notes Electronic Signature(s) Signed: 07/09/2017 4:38:21 PM By: Montey Hora Entered By: Montey Hora on 07/09/2017 08:14:55 Rohleder, [name] ([id number]) -------------------------------------------------------------------------------- [place] Information Details Patient Name: [name] Date of Service: 07/09/2017 8:00 AM Medical Record Number: [id number] Patient Account Number: [id number] Date of Birth/Sex: 1950-11-05 (67 y.o. Male) Treating RN: Montey Hora Primary Care Desta Bujak: Raechel Ache, DAVID Other Clinician: Referring Julliana Whitmyer: Raechel Ache, DAVID Treating Rolan Wrightsman/Extender: Cathie Olden in Treatment: 0 Visit Information Patient Arrived: Ambulatory Arrival Time: 08:13 Accompanied By: self Transfer Assistance: None Patient Identification Verified: Yes Secondary Verification Process Yes Completed: Patient Has Alerts: Yes Patient Alerts: Patient on Blood Thinner pradaxa Electronic Signature(s) Signed: 07/09/2017 4:38:21 PM By: Montey Hora Entered By: Montey Hora on 07/09/2017 08:14:15 Fuertes, [name] ([id number]) -------------------------------------------------------------------------------- Clinic Level of Care Assessment Details Patient Name: [name] Date of Service: 07/09/2017 8:00 AM Medical Record Number: [id number] Patient Account Number: [id number] Date of Birth/Sex: March 22, 1950 (67 y.o. Male) Treating RN: Ahmed Prima Primary Care Carmin Dibartolo: Raechel Ache, DAVID  Other Clinician: Referring Dali Kraner: Raechel Ache, DAVID Treating Rupert Azzara/Extender: Cathie Olden in Treatment: 0 Clinic Level of Care Assessment Items TOOL 1 Quantity Score X - Use when EandM and Procedure is performed on INITIAL visit 1 0 ASSESSMENTS - Nursing Assessment / Reassessment X - General Physical Exam (combine w/ comprehensive assessment (listed just below) when 1 20 performed on new pt. evals) X- 1 25 Comprehensive Assessment (HX, ROS, Risk Assessments, Wounds Hx, etc.) ASSESSMENTS - Wound and Skin Assessment / Reassessment []  - Dermatologic / Skin Assessment (not related to wound area) 0 ASSESSMENTS - Ostomy and/or Continence Assessment and Care []  - Incontinence Assessment and Management 0 []  - 0 Ostomy Care Assessment and Management (repouching, etc.) PROCESS - Coordination of Care X - Simple Patient / Family Education for ongoing care 1 15 []  - 0 Complex (extensive) Patient / Family Education for ongoing care []  - 0 Staff obtains Programmer, systems, Records, Test Results / Process Orders []  - 0 Staff telephones HHA, Nursing Homes / Clarify orders / etc []  - 0 Routine Transfer to another Facility (non-emergent condition) []  - 0 Routine Hospital Admission (non-emergent condition) X- 1 15 New Admissions / Biomedical engineer / Ordering NPWT, Apligraf, etc. []  - 0 Emergency Hospital Admission (emergent condition) PROCESS - Special Needs []  - Pediatric / Minor Patient Management 0 []  - 0 Isolation Patient Management []  - 0 Hearing / Language / Visual special needs []  - 0 Assessment of Community assistance (transportation, D/C planning, etc.) []  - 0 Additional assistance / Altered mentation []  - 0 Support Surface(s) Assessment (bed, cushion, seat, etc.) Stegemann, Adonai L. ([id number]) INTERVENTIONS - Miscellaneous []  - External ear exam 0 []  - 0 Patient Transfer (multiple staff / Civil Service fast streamer / Similar devices) []  - 0 Simple Staple / Suture removal (25 or  less) []  - 0 Complex Staple / Suture removal (26 or more) []  - 0 Hypo/Hyperglycemic Management (do not check if billed separately) []  - 0 Ankle / Brachial Index (ABI) - do not check if billed separately Has the patient been seen at the hospital within the last three years: Yes Total Score:  75 Level Of Care: New/Established - Level 2 Electronic Signature(s) Signed: 07/13/2017 5:11:46 PM By: Alric Quan Entered By: Alric Quan on 07/09/2017 09:45:05 Scroggs, Sriman Carlean Jews ([id number]) -------------------------------------------------------------------------------- Encounter Discharge Information Details Patient Name: [name] Date of Service: 07/09/2017 8:00 AM Medical Record Number: [id number] Patient Account Number: [id number] Date of Birth/Sex: 09-10-1950 (67 y.o. Male) Treating RN: Ahmed Prima Primary Care Elexia Friedt: Raechel Ache, DAVID Other Clinician: Referring Spike Desilets: Raechel Ache, DAVID Treating Shelise Maron/Extender: Cathie Olden in Treatment: 0 Encounter Discharge Information Items Discharge Condition: Stable Ambulatory Status: Ambulatory Discharge Destination: Home Transportation: Private Auto Accompanied By: self Schedule Follow-up Appointment: Yes Clinical Summary of Care: Electronic Signature(s) Signed: 07/09/2017 9:40:30 AM By: Alric Quan Entered By: Alric Quan on 07/09/2017 09:40:30 Yadao, [name] ([id number]) -------------------------------------------------------------------------------- Lower Extremity Assessment Details Patient Name: [name] Date of Service: 07/09/2017 8:00 AM Medical Record Number: [id number] Patient Account Number: [id number] Date of Birth/Sex: [birth year] (67 y.o. Male) Treating RN: Montey Hora Primary Care Elana Jian: Raechel Ache, DAVID Other Clinician: Referring Dandrea Medders: Raechel Ache, DAVID Treating Garvey Westcott/Extender: Lawanda Cousins Weeks in Treatment: 0 Electronic Signature(s) Signed: 07/09/2017 4:38:21 PM By: Montey Hora Entered By: Montey Hora on 07/09/2017 08:28:41 Colberg, [name] ([id number]) -------------------------------------------------------------------------------- Multi Wound Chart Details Patient Name: [name] Date of Service: 07/09/2017 8:00 AM Medical Record Number: [id number] Patient Account Number: [id number] Date of Birth/Sex: 01/04/1951 (67 y.o. Male) Treating RN: Ahmed Prima Primary Care Jameisha Stofko: Raechel Ache, DAVID Other Clinician: Referring Muriel Wilber: Raechel Ache, DAVID Treating Laquanda Bick/Extender: Cathie Olden in Treatment: 0 Vital Signs Height(in): 70 Pulse(bpm): 55 Weight(lbs): 215 Blood Pressure(mmHg): 129/75 Body Mass Index(BMI): 31 Temperature(F): 98.3 Respiratory Rate 16 (breaths/min): Photos: [1:No Photos] [N/A:N/A] Wound Location: [1:Right Head - Parietal] [N/A:N/A] Wounding Event: [1:Trauma] [N/A:N/A] Primary Etiology: [1:Trauma, Other] [N/A:N/A] Comorbid History: [1:Sleep Apnea, Arrhythmia, Coronary Artery Disease, Hypertension] [N/A:N/A] Date Acquired: [1:12/30/2016] [N/A:N/A] Weeks of Treatment: [1:0] [N/A:N/A] Wound Status: [1:Open] [N/A:N/A] Measurements L x W x D [1:7x2.8x0.1] [N/A:N/A] (cm) Area (cm) : [1:15.394] [N/A:N/A] Volume (cm) : [1:1.539] [N/A:N/A] Classification: [1:Partial Thickness] [N/A:N/A] Exudate Amount: [1:Small] [N/A:N/A] Exudate Type: [1:Sanguinous] [N/A:N/A] Exudate Color: [1:red] [N/A:N/A] Wound Margin: [1:Indistinct, nonvisible] [N/A:N/A] Granulation Amount: [1:None Present (0%)] [N/A:N/A] Necrotic Amount: [1:Large (67-100%)] [N/A:N/A] Necrotic Tissue: [1:Eschar] [N/A:N/A] Exposed Structures: [1:Fascia: No Fat Layer (Subcutaneous Tissue) Exposed: No Tendon: No Muscle: No Joint: No Bone: No] [N/A:N/A] Epithelialization: [1:None] [N/A:N/A] Debridement: [1:Debridement - Excisional] [N/A:N/A] Pre-procedure [1:08:38] [N/A:N/A] Verification/Time Out Taken: Pain Control: [1:Lidocaine 4% Topical Solution]  [N/A:N/A] Tissue Debrided: [1:Subcutaneous, Slough] [N/A:N/A] Level: [1:Skin/Subcutaneous Tissue] [N/A:N/A] Debridement Area (sq cm): [1:19.6] [N/A:N/A] Instrument: Blade, Forceps N/A N/A Bleeding: Moderate N/A N/A Hemostasis Achieved: Silver Nitrate N/A N/A Procedural Pain: 0 N/A N/A Post Procedural Pain: 0 N/A N/A Debridement Treatment Procedure was tolerated well N/A N/A Response: Post Debridement 2.5x1x0.1 N/A N/A Measurements L x W x D (cm) Post Debridement Volume: 0.196 N/A N/A (cm) Periwound Skin Texture: Excoriation: No N/A N/A Induration: No Callus: No Crepitus: No Rash: No Scarring: No Periwound Skin Moisture: Maceration: No N/A N/A Dry/Scaly: No Periwound Skin Color: Atrophie Blanche: No N/A N/A Cyanosis: No Ecchymosis: No Erythema: No Hemosiderin Staining: No Mottled: No Pallor: No Rubor: No Tenderness on Palpation: Yes N/A N/A Wound Preparation: Ulcer Cleansing: N/A N/A Rinsed/Irrigated with Saline Topical Anesthetic Applied: Other: lidocaine 4% Procedures Performed: Debridement N/A N/A Treatment Notes Electronic Signature(s) Signed: 07/09/2017 9:38:03 AM By: Lawanda Cousins Entered By: Lawanda Cousins on 07/09/2017 09:38:02 Kossman, [name] ([id number]) -------------------------------------------------------------------------------- [place] Details Patient Name: [name] Date of Service: 07/09/2017 8:00 AM  Medical Record Number: [id number] Patient Account Number: [id number] Date of Birth/Sex: 1950/04/04 (67 y.o. Male) Treating RN: Carolyne Fiscal, Debi Primary Care Elon Eoff: Raechel Ache, DAVID Other Clinician: Referring Shalamar Crays: Raechel Ache, DAVID Treating Halie Gass/Extender: Cathie Olden in Treatment: 0 Active Inactive ` Abuse / Safety / Falls / Self Care Management Nursing Diagnoses: Potential for falls Goals: Patient will not experience any injury related to falls Date Initiated: 07/09/2017 Target Resolution Date:  09/19/2017 Goal Status: Active Interventions: Assess Activities of Daily Living upon admission and as needed Assess fall risk on admission and as needed Assess: immobility, friction, shearing, incontinence upon admission and as needed Assess impairment of mobility on admission and as needed per policy Assess personal safety and home safety (as indicated) on admission and as needed Notes: ` Nutrition Nursing Diagnoses: Imbalanced nutrition Goals: Patient/caregiver agrees to and verbalizes understanding of need to use nutritional supplements and/or vitamins as prescribed Date Initiated: 07/09/2017 Target Resolution Date: 09/19/2017 Goal Status: Active Interventions: Assess patient nutrition upon admission and as needed per policy Notes: ` Orientation to the Wound Care Program Nursing Diagnoses: Knowledge deficit related to the wound healing center program Goals: Patient/caregiver will verbalize understanding of the [place], [place] ([id number]) Date Initiated: 07/09/2017 Target Resolution Date: 07/18/2017 Goal Status: Active Interventions: Provide education on orientation to the wound center Notes: ` Wound/Skin Impairment Nursing Diagnoses: Impaired tissue integrity Knowledge deficit related to ulceration/compromised skin integrity Goals: Ulcer/skin breakdown will have a volume reduction of 80% by week 12 Date Initiated: 07/09/2017 Target Resolution Date: 10/17/2017 Goal Status: Active Interventions: Assess patient/caregiver ability to perform ulcer/skin care regimen upon admission and as needed Assess ulceration(s) every visit Notes: Electronic Signature(s) Signed: 07/13/2017 5:11:46 PM By: Alric Quan Entered By: Alric Quan on 07/09/2017 08:40:33 Hulett, Bailey. ([id number]) -------------------------------------------------------------------------------- Pain Assessment Details Patient Name: [name] Date of Service: 07/09/2017  8:00 AM Medical Record Number: [id number] Patient Account Number: [id number] Date of Birth/Sex: [birth year] (67 y.o. Male) Treating RN: Montey Hora Primary Care Mory Herrman: Raechel Ache, DAVID Other Clinician: Referring Jasiah Elsen: Raechel Ache, DAVID Treating Loris Seelye/Extender: Cathie Olden in Treatment: 0 Active Problems Location of Pain Severity and Description of Pain Patient Has Paino No Site Locations Pain Management and Medication Current Pain Management: Electronic Signature(s) Signed: 07/09/2017 4:38:21 PM By: Montey Hora Entered By: Montey Hora on 07/09/2017 08:14:22 Melroy, [name] ([id number]) -------------------------------------------------------------------------------- Patient/Caregiver Education Details Patient Name: [name] Date of Service: 07/09/2017 8:00 AM Medical Record Number: [id number] Patient Account Number: [id number] Date of Birth/Gender: 16-Sep-1950 (67 y.o. Male) Treating RN: Ahmed Prima Primary Care Physician: Raechel Ache, DAVID Other Clinician: Referring Physician: Raechel Ache, DAVID Treating Physician/Extender: Cathie Olden in Treatment: 0 Education Assessment Education Provided To: Patient Education Topics Provided Welcome To [facility]: Handouts: Welcome To [facility] Methods: Explain/Verbal Responses: State content correctly Wound/Skin Impairment: Handouts: Caring for Your Ulcer, Skin Care Do's and Dont's, Other: change dressing as ordered Methods: Demonstration, Explain/Verbal Responses: State content correctly Electronic Signature(s) Signed: 07/13/2017 5:11:46 PM By: Alric Quan Entered By: Alric Quan on 07/09/2017 09:40:56 [place], [place]. ([id number]) -------------------------------------------------------------------------------- Wound Assessment Details Patient Name: [name] Date of Service: 07/09/2017 8:00 AM Medical Record Number: [id number] Patient Account Number: [id number] Date of  Birth/Sex: 12/12/1950 (67 y.o. Male) Treating RN: Montey Hora Primary Care Jaquavian Firkus: Raechel Ache, DAVID Other Clinician: Referring Brenen Beigel: Raechel Ache, DAVID Treating Mckaylee Dimalanta/Extender: Lawanda Cousins Weeks in Treatment: 0 Wound Status Wound Number: 1 Primary Trauma, Other Etiology: Wound Location: Right Head - Parietal Wound Status: Open Wounding  Event: Trauma Comorbid Sleep Apnea, Arrhythmia, Coronary Artery Date Acquired: 12/30/2016 History: Disease, Hypertension Weeks Of Treatment: 0 Clustered Wound: No Photos Wound Measurements Length: (cm) 7 % Reduction Width: (cm) 2.8 % Reduction Depth: (cm) 0.1 Epithelializ Area: (cm) 15.394 Tunneling: Volume: (cm) 1.539 Undermining in Area: 0% in Volume: 0% ation: None No : No Wound Description Classification: Partial Thickness Foul Odor Af Wound Margin: Indistinct, nonvisible Slough/Fibri Exudate Amount: Small Exudate Type: Sanguinous Exudate Color: red ter Cleansing: No no No Wound Bed Granulation Amount: None Present (0%) Exposed Structure Necrotic Amount: Large (67-100%) Fascia Exposed: No Necrotic Quality: Eschar Fat Layer (Subcutaneous Tissue) Exposed: No Tendon Exposed: No Muscle Exposed: No Joint Exposed: No Bone Exposed: No Periwound Skin Texture Texture Color Finkler, Tige L. ([id number]) No Abnormalities Noted: No No Abnormalities Noted: No Callus: No Atrophie Blanche: No Crepitus: No Cyanosis: No Excoriation: No Ecchymosis: No Induration: No Erythema: No Rash: No Hemosiderin Staining: No Scarring: No Mottled: No Pallor: No Moisture Rubor: No No Abnormalities Noted: No Dry / Scaly: No Temperature / Pain Maceration: No Tenderness on Palpation: Yes Wound Preparation Ulcer Cleansing: Rinsed/Irrigated with Saline Topical Anesthetic Applied: Other: lidocaine 4%, Electronic Signature(s) Signed: 07/09/2017 4:38:21 PM By: Montey Hora Entered By: Montey Hora on 07/09/2017 10:38:04 [name] ([id number]) -------------------------------------------------------------------------------- [place] Details Patient Name: [name] Date of Service: 07/09/2017 8:00 AM Medical Record Number: [id number] Patient Account Number: [id number] Date of Birth/Sex: Jun 11, 1950 (67 y.o. Male) Treating RN: Montey Hora Primary Care Majesty Oehlert: Raechel Ache, DAVID Other Clinician: Referring Peg Fifer: Raechel Ache, DAVID Treating Kalonji Zurawski/Extender: Cathie Olden in Treatment: 0 Vital Signs Time Taken: 08:22 Temperature (F): 98.3 Height (in): 70 Pulse (bpm): 53 Source: Measured Respiratory Rate (breaths/min): 16 Weight (lbs): 215 Blood Pressure (mmHg): 129/75 Source: Measured Reference Range: 80 - 120 mg / dl Body Mass Index (BMI): 30.8 Electronic Signature(s) Signed: 07/09/2017 4:38:21 PM By: Montey Hora Entered By: Montey Hora on 07/09/2017 08:23:54

## 2017-07-23 ENCOUNTER — Encounter: Payer: Medicare Other | Attending: Nurse Practitioner | Admitting: Nurse Practitioner

## 2017-07-23 DIAGNOSIS — G4730 Sleep apnea, unspecified: Secondary | ICD-10-CM | POA: Insufficient documentation

## 2017-07-23 DIAGNOSIS — S0101XS Laceration without foreign body of scalp, sequela: Secondary | ICD-10-CM | POA: Diagnosis not present

## 2017-07-23 DIAGNOSIS — I4891 Unspecified atrial fibrillation: Secondary | ICD-10-CM | POA: Diagnosis not present

## 2017-07-23 DIAGNOSIS — W19XXXS Unspecified fall, sequela: Secondary | ICD-10-CM | POA: Diagnosis not present

## 2017-07-23 DIAGNOSIS — Z8249 Family history of ischemic heart disease and other diseases of the circulatory system: Secondary | ICD-10-CM | POA: Insufficient documentation

## 2017-07-23 DIAGNOSIS — I2510 Atherosclerotic heart disease of native coronary artery without angina pectoris: Secondary | ICD-10-CM | POA: Diagnosis not present

## 2017-07-23 DIAGNOSIS — I10 Essential (primary) hypertension: Secondary | ICD-10-CM | POA: Diagnosis not present

## 2017-07-23 DIAGNOSIS — Z9989 Dependence on other enabling machines and devices: Secondary | ICD-10-CM | POA: Insufficient documentation

## 2017-07-25 NOTE — Progress Notes (Signed)
[name], [name] ([id number]) Visit Report for 07/23/2017 Chief Complaint Document Details Patient Name: Paul, [name] Date of Service: 07/23/2017 8:00 AM Medical Record Number: [id number] Patient Account Number: [id number] Date of Birth/Sex: [birth year] (66 y.o. M) Treating RN: Ahmed Prima Primary Care Provider: Raechel Ache, DAVID Other Clinician: Referring Provider: Raechel Ache, DAVID Treating Provider/Extender: Cathie Olden in Treatment: 2 Information Obtained from: Patient Chief Complaint He is here for parietal/occipital scalp wound Electronic Signature(s) Signed: 07/23/2017 9:11:05 AM By: Lawanda Cousins Entered By: Lawanda Cousins on 07/23/2017 09:11:05 [place], [name] ([id number]) -------------------------------------------------------------------------------- HPI Details Patient Name: [name] Date of Service: 07/23/2017 8:00 AM Medical Record Number: [id number] Patient Account Number: [id number] Date of Birth/Sex: 03/26/1950 (66 y.o. M) Treating RN: Ahmed Prima Primary Care Provider: Raechel Ache, DAVID Other Clinician: Referring Provider: Raechel Ache, DAVID Treating Provider/Extender: Cathie Olden in Treatment: 2 History of Present Illness HPI Description: 07/09/17- He is here for initial evaluation for a scalp wound. He reports a fall with laceration in December 30, 2016, stapled close in the ER; his PCP removed staples January 15, 2017. He states there was a scab over the area when staples were removed; he was not instructed on any specific topical treatment. He has continued to have drainage and presents today with a large area of crusted drainage integrated into his hair. All of this was cleaned and trimmed away to reveal a large area of hypergranular tissue. This was treated with debridement and chemical cautery for hemostasis. He will continue with topical antibiotic ointment twice daily and follow up in two weeks. He has been advised, multiple times, to have area  covered with gauze and secure with cap/hat when going out of the home. 07/23/17-He is here for follow-up evaluation of a scalp wound. He continues to apply over-the-counter antibiotic ointment, he admits to keeping this protected when outside. There is a small amount of hypergranuar tissue that was chemically cauterized. He will begin in about appointment on Saturday, applying daily. He will follow-up in 2 weeks as he is unable to make next week's appointment. I expect this area will be healed at that appointment Electronic Signature(s) Signed: 07/23/2017 9:12:19 AM By: Lawanda Cousins Entered By: Lawanda Cousins on 07/23/2017 09:12:18 [name], [name] ([id number]) -------------------------------------------------------------------------------- Otelia Sergeant TISS Details Patient Name: [name] Date of Service: 07/23/2017 8:00 AM Medical Record Number: [id number] Patient Account Number: [id number] Date of Birth/Sex: 03-Aug-1950 (66 y.o. M) Treating RN: Ahmed Prima Primary Care Provider: Raechel Ache, DAVID Other Clinician: Referring Provider: Raechel Ache, DAVID Treating Provider/Extender: Cathie Olden in Treatment: 2 Procedure Performed for: Wound #1 Right Head - occiput Performed By: Physician Lawanda Cousins, NP Post Procedure Diagnosis Same as Pre-procedure Notes silver nitrate stick used for chemical cautery of hypergranular tissue Electronic Signature(s) Signed: 07/23/2017 9:08:26 AM By: Lawanda Cousins Entered By: Lawanda Cousins on 07/23/2017 09:08:26 [name] ([id number]) -------------------------------------------------------------------------------- Physician Orders Details Patient Name: [name] Date of Service: 07/23/2017 8:00 AM Medical Record Number: [id number] Patient Account Number: [id number] Date of Birth/Sex: [birth year] (66 y.o. M) Treating RN: Ahmed Prima Primary Care Provider: Raechel Ache, DAVID Other Clinician: Referring Provider: Raechel Ache,  DAVID Treating Provider/Extender: Cathie Olden in Treatment: 2 Verbal / Phone Orders: Yes Clinician: Pinkerton, Debi Read Back and Verified: Yes Diagnosis Coding Wound Cleansing Wound #1 Right Head - occiput o Clean wound with Normal Saline. o Cleanse wound with mild soap and water o May Shower, gently pat wound dry prior to applying new dressing. Anesthetic (add to Medication List) Wound #1  Right Head - occiput o Topical Lidocaine 4% cream applied to wound bed prior to debridement (In Clinic Only). Primary Wound Dressing Wound #1 Right Head - occiput o Other: - Bacitracin in office Polysporin at home Secondary Dressing Wound #1 Right Head - occiput o Dry Gauze - when needed Dressing Change Frequency Wound #1 Right Head - occiput o Change dressing twice daily. Follow-up Appointments Wound #1 Right Head - occiput o Return Appointment in 2 weeks. Off-Loading Wound #1 Right Head - occiput o Other: - do not put pressure on area Additional Orders / Instructions Wound #1 Right Head - occiput o Vitamin A; Vitamin C, Zinc o Increase protein intake. Patient Medications Allergies: [name] Known Allergies Notifications Medication Indication Start End lidocaine [name], [name] ([id number]) Notifications Medication Indication Start End DOSE 1 - topical 4 % cream - 1 cream topical Electronic Signature(s) Signed: 07/23/2017 9:10:59 PM By: Lawanda Cousins Signed: 07/24/2017 11:31:29 AM By: Alric Quan Entered By: Alric Quan on 07/23/2017 08:23:01 [name], [name] ([id number]) -------------------------------------------------------------------------------- Prescription 07/23/2017 Patient Name: [name] Provider: Lawanda Cousins NP Date of Birth: 10/27/1950 NPI#: [id number] Sex: M DEA#: [id number] Phone #: [phone] License #: Patient Address: [facility] [place], [place] [address] [address], [place], [place] [address] [phone] Allergies [name] Known Allergies Medication Medication: Route: Strength: Form: lidocaine 4 % topical cream topical 4% cream Class: TOPICAL LOCAL ANESTHETICS Dose: Frequency / Time: Indication: 1 1 cream topical Number of Refills: Number of Units: 0 Generic Substitution: Start Date: End Date: One Time Use: Substitution Permitted [name] Note to Pharmacy: Signature(s): Date(s): Electronic Signature(s) Signed: 07/23/2017 9:10:59 PM By: Lawanda Cousins Signed: 07/24/2017 11:31:29 AM By: Alric Quan Entered By: Alric Quan on 07/23/2017 08:23:01 [place], [name] ([id number]) --------------------------------------------------------------------------------  Problem List Details Patient Name: [name] Date of Service: 07/23/2017 8:00 AM Medical Record Number: [id number] Patient Account Number: [id number] Date of Birth/Sex: [birth year] (66 y.o. M) Treating RN: Ahmed Prima Primary Care Provider: Raechel Ache, DAVID Other Clinician: Referring Provider: Raechel Ache, DAVID Treating Provider/Extender: Cathie Olden in Treatment: 2 Active Problems ICD-10 Evaluated Encounter Code Description Active Date Today Diagnosis S01.01XS Laceration without foreign body of scalp, sequela 07/09/2017 [name] Yes Inactive Problems Resolved Problems Electronic Signature(s) Signed: 07/23/2017 9:07:27 AM By: Lawanda Cousins Entered By: Lawanda Cousins on 07/23/2017 09:07:27 [name], [name] ([id number]) -------------------------------------------------------------------------------- Progress Note Details Patient Name: [name] Date of Service: 07/23/2017 8:00 AM Medical Record Number: [id number] Patient Account Number: [id number] Date of Birth/Sex: 1950/09/05 (66 y.o. M) Treating RN: Ahmed Prima Primary Care Provider: Raechel Ache, DAVID Other Clinician: Referring Provider: Raechel Ache, DAVID Treating  Provider/Extender: Cathie Olden in Treatment: 2 Subjective Chief Complaint Information obtained from Patient He is here for parietal/occipital scalp wound History of Present Illness (HPI) 07/09/17- He is here for initial evaluation for a scalp wound. He reports a fall with laceration in December 30, 2016, stapled close in the ER; his PCP removed staples January 15, 2017. He states there was a scab over the area when staples were removed; he was not instructed on any specific topical treatment. He has continued to have drainage and presents today with a large area of crusted drainage integrated into his hair. All of this was cleaned and trimmed away to reveal a large area of hypergranular tissue. This was treated with debridement and chemical cautery for hemostasis. He will continue with topical antibiotic ointment twice daily and follow up in two weeks.  He has been advised, multiple times, to have area covered with gauze and secure with cap/hat when going out of the home. 07/23/17-He is here for follow-up evaluation of a scalp wound. He continues to apply over-the-counter antibiotic ointment, he admits to keeping this protected when outside. There is a small amount of hypergranuar tissue that was chemically cauterized. He will begin in about appointment on Saturday, applying daily. He will follow-up in 2 weeks as he is unable to make next week's appointment. I expect this area will be healed at that appointment Patient History Information obtained from Patient. Family History Cancer - Father, Diabetes - Mother, Heart Disease - Paternal Grandparents, Hypertension - Paternal Grandparents, Lung Disease - Father, [name] family history of Hereditary Spherocytosis, Kidney Disease, Seizures, Stroke, Thyroid Problems, Tuberculosis. Social History Never smoker, Marital Status - Married, Alcohol Use - Rarely, Drug Use - [name] History, Caffeine Use - Daily. Objective Constitutional Vitals Time Taken:  8:14 AM, Height: 70 in, Weight: 215 lbs, BMI: 30.8, Temperature: 98.2 F, Pulse: 70 bpm, Respiratory Rate: 16 breaths/min, Blood Pressure: 129/90 mmHg. [place], [place] ([id number]) Integumentary (Hair, Skin) Wound #1 status is Open. Original cause of wound was Trauma. The wound is located on the Right Head - occiput. The wound measures 0.5cm length x 0.5cm width x 0.1cm depth; 0.196cm^2 area and 0.02cm^3 volume. There is [name] tunneling or undermining noted. There is a none present amount of drainage noted. The wound margin is indistinct and nonvisible. There is [name] granulation within the wound bed. There is a large (67-100%) amount of necrotic tissue within the wound bed including Eschar. The periwound skin appearance did not exhibit: Callus, Crepitus, Excoriation, Induration, Rash, Scarring, Dry/Scaly, Maceration, Atrophie Blanche, Cyanosis, Ecchymosis, Hemosiderin Staining, Mottled, Pallor, Rubor, Erythema. The periwound has tenderness on palpation. Assessment Active Problems ICD-10 Laceration without foreign body of scalp, sequela Procedures Wound #1 Pre-procedure diagnosis of Wound #1 is a Trauma, Other located on the Right Head - occiput . An CHEM CAUT GRANULATION TISS procedure was performed by Lawanda Cousins, NP. Post procedure Diagnosis Wound #1: Same as Pre-Procedure Notes: silver nitrate stick used for chemical cautery of hypergranular tissue Plan Wound Cleansing: Wound #1 Right Head - occiput: Clean wound with Normal Saline. Cleanse wound with mild soap and water May Shower, gently pat wound dry prior to applying new dressing. Anesthetic (add to Medication List): Wound #1 Right Head - occiput: Topical Lidocaine 4% cream applied to wound bed prior to debridement (In Clinic Only). Primary Wound Dressing: Wound #1 Right Head - occiput: Other: - Bacitracin in office Polysporin at home Secondary Dressing: Wound #1 Right Head - occiput: Dry Gauze - when needed Dressing Change  Frequency: Wound #1 Right Head - occiput: Change dressing twice daily. Follow-up Appointments: Wound #1 Right Head - occiput: Return Appointment in 2 weeks. [name], [name] ([id number]) Off-Loading: Wound #1 Right Head - occiput: Other: - do not put pressure on area Additional Orders / Instructions: Wound #1 Right Head - occiput: Vitamin A; Vitamin C, Zinc Increase protein intake. The following medication(s) was prescribed: lidocaine topical 4 % cream 1 1 cream topical was prescribed at facility Electronic Signature(s) Signed: 07/23/2017 9:13:20 AM By: Lawanda Cousins Entered By: Lawanda Cousins on 07/23/2017 09:13:20 Zia, [name] ([id number]) -------------------------------------------------------------------------------- ROS/PFSH Details Patient Name: [name] Date of Service: 07/23/2017 8:00 AM Medical Record Number: [id number] Patient Account Number: [id number] Date of Birth/Sex: 12-27-1950 (66 y.o. M) Treating RN: Ahmed Prima Primary Care Provider: Raechel Ache, DAVID Other Clinician: Referring Provider: Raechel Ache,  DAVID Treating Provider/Extender: Cathie Olden in Treatment: 2 Information Obtained From Patient Wound History Do you currently have one or more open woundso Yes How many open wounds do you currently haveo 1 Approximately how long have you had your woundso 6 months How have you been treating your wound(s) until nowo open to air Has your wound(s) ever healed and then re-openedo [name] Have you had any lab work done in the past montho [name] Have you tested positive for an antibiotic resistant organism (MRSA, VRE)o [name] Have you tested positive for osteomyelitis (bone infection)o [name] Have you had any tests for circulation on your legso [name] Eyes Medical History: Negative for: Cataracts; Glaucoma; Optic Neuritis Ear/Nose/Mouth/Throat Medical History: Negative for: Chronic sinus problems/congestion; Middle ear problems Hematologic/Lymphatic Medical  History: Negative for: Anemia; Hemophilia; Human Immunodeficiency Virus; Lymphedema; Sickle Cell Disease Respiratory Medical History: Positive for: Sleep Apnea - CPAP Negative for: Aspiration; Asthma; Chronic Obstructive Pulmonary Disease (COPD); Pneumothorax; Tuberculosis Cardiovascular Medical History: Positive for: Arrhythmia - a fib; Coronary Artery Disease; Hypertension Negative for: Angina; Congestive Heart Failure; Deep Vein Thrombosis; Hypotension; Myocardial Infarction; Peripheral Arterial Disease; Peripheral Venous Disease; Phlebitis; Vasculitis Gastrointestinal Medical History: Negative for: Cirrhosis ; Colitis; Crohnos; Hepatitis A; Hepatitis B; Hepatitis C Endocrine [name], [name] ([id number]) Medical History: Negative for: Type I Diabetes; Type II Diabetes Genitourinary Medical History: Negative for: End Stage Renal Disease Immunological Medical History: Negative for: Lupus Erythematosus; Raynaudos; Scleroderma Integumentary (Skin) Medical History: Negative for: History of Burn; History of pressure wounds Musculoskeletal Medical History: Negative for: Gout; Rheumatoid Arthritis; Osteoarthritis; Osteomyelitis Neurologic Medical History: Negative for: Dementia; Neuropathy; Quadriplegia; Paraplegia; Seizure Disorder Oncologic Medical History: Negative for: Received Chemotherapy; Received Radiation Psychiatric Medical History: Negative for: Anorexia/bulimia; Confinement Anxiety Immunizations Pneumococcal Vaccine: Received Pneumococcal Vaccination: Yes Immunization Notes: up to date Implantable Devices Family and Social History Cancer: Yes - Father; Diabetes: Yes - Mother; Heart Disease: Yes - Paternal Grandparents; Hereditary Spherocytosis: [name]; Hypertension: Yes - Paternal Grandparents; Kidney Disease: [name]; Lung Disease: Yes - Father; Seizures: [name]; Stroke: [name]; Thyroid Problems: [name]; Tuberculosis: [name]; Never smoker; Marital Status - Married; Alcohol Use: Rarely;  Drug Use: [name] History; Caffeine Use: Daily; Financial Concerns: [name]; Food, Clothing or Shelter Needs: [name]; Support System Lacking: [name]; Transportation Concerns: [name]; Advanced Directives: [name]; Patient does not want information on Advanced Directives Physician Affirmation I have reviewed and agree with the above information. Electronic Signature(s) Signed: 07/23/2017 9:10:59 PM By: Guad[place]pe Spanish, [name] ([id number]) Signed: 07/24/2017 11:31:29 AM By: Alric Quan Entered By: Lawanda Cousins on 07/23/2017 09:13:05 [name] ([id number]) -------------------------------------------------------------------------------- SuperBill Details Patient Name: [name] Date of Service: 07/23/2017 Medical Record Number: [id number] Patient Account Number: [id number] Date of Birth/Sex: [birth year] (66 y.o. M) Treating RN: Ahmed Prima Primary Care Provider: Raechel Ache, DAVID Other Clinician: Referring Provider: Raechel Ache, DAVID Treating Provider/Extender: Cathie Olden in Treatment: 2 Diagnosis Coding ICD-10 Codes Code Description S01.01XS Laceration without foreign body of scalp, sequela Facility Procedures CPT4 Code: [id number] Description: 80034 - CHEM CAUT GRANULATION TISS ICD-10 Diagnosis Description S01.01XS Laceration without foreign body of scalp, sequela Modifier: Quantity: 1 Physician Procedures CPT4 Code: [id number] Description: 56979 - WC PHYS CHEM CAUT GRAN TISSUE ICD-10 Diagnosis Description S01.01XS Laceration without foreign body of scalp, sequela Modifier: Quantity: 1 Electronic Signature(s) Signed: 07/23/2017 9:13:39 AM By: Lawanda Cousins Entered By: Lawanda Cousins on 07/23/2017 09:13:39

## 2017-07-25 NOTE — Progress Notes (Signed)
[name] ([id number]) Visit Report for 07/23/2017 Arrival Information Details Patient Name: [name] Date of Service: 07/23/2017 8:00 AM Medical Record Number: [id number] Patient Account Number: [id number] Date of Birth/Sex: October 20, 1950 (66 y.o. M) Treating RN: Roger Shelter Primary Care Judi Jaffe: Raechel Ache, DAVID Other Clinician: Referring Alexis Mizuno: Raechel Ache, DAVID Treating Jozlynn Plaia/Extender: Cathie Olden in Treatment: 2 Visit Information History Since Last Visit All ordered tests and consults were completed: No Patient Arrived: Ambulatory Added or deleted any medications: No Arrival Time: 08:14 Any new allergies or adverse reactions: No Accompanied By: self Had a fall or experienced change in No Transfer Assistance: None activities of daily living that may affect Patient Identification Verified: Yes risk of falls: Secondary Verification Process Yes Signs or symptoms of abuse/neglect since last visito No Completed: Hospitalized since last visit: No Patient Has Alerts: Yes Implantable device outside of the clinic excluding No Patient Alerts: Patient on Blood cellular tissue based products placed in the center Thinner since last visit: pradaxa Pain Present Now: No Electronic Signature(s) Signed: 07/24/2017 10:58:55 AM By: Roger Shelter Entered By: Roger Shelter on 07/23/2017 08:14:38 [name] ([id number]) -------------------------------------------------------------------------------- Encounter Discharge Information Details Patient Name: [name] Date of Service: 07/23/2017 8:00 AM Medical Record Number: [id number] Patient Account Number: [id number] Date of Birth/Sex: 23-Oct-1950 (66 y.o. M) Treating RN: Ahmed Prima Primary Care Glada Wickstrom: Raechel Ache, DAVID Other Clinician: Referring Kevion Fatheree: Raechel Ache, DAVID Treating Shana Younge/Extender: Cathie Olden in Treatment: 2 Encounter Discharge Information Items Discharge Condition:  Stable Ambulatory Status: Ambulatory Discharge Destination: Home Transportation: Private Auto Accompanied By: self Schedule Follow-up Appointment: Yes Clinical Summary of Care: Electronic Signature(s) Signed: 07/24/2017 11:31:29 AM By: Alric Quan Entered By: Alric Quan on 07/23/2017 08:23:58 [name] ([id number]) -------------------------------------------------------------------------------- Lower Extremity Assessment Details Patient Name: [name] Date of Service: 07/23/2017 8:00 AM Medical Record Number: [id number] Patient Account Number: [id number] Date of Birth/Sex: 1950/03/25 (66 y.o. M) Treating RN: Roger Shelter Primary Care Delylah Stanczyk: Raechel Ache, DAVID Other Clinician: Referring Mykel Sponaugle: Raechel Ache, DAVID Treating Indi Willhite/Extender: Cathie Olden in Treatment: 2 Electronic Signature(s) Signed: 07/24/2017 10:58:55 AM By: Roger Shelter Entered By: Roger Shelter on 07/23/2017 08:18:28 [name] ([id number]) -------------------------------------------------------------------------------- Multi Wound Chart Details Patient Name: [name] Date of Service: 07/23/2017 8:00 AM Medical Record Number: [id number] Patient Account Number: [id number] Date of Birth/Sex: [birth year] (66 y.o. M) Treating RN: Ahmed Prima Primary Care Othell Jaime: Raechel Ache, DAVID Other Clinician: Referring Lukisha Procida: Raechel Ache, DAVID Treating Semira Stoltzfus/Extender: Cathie Olden in Treatment: 2 Vital Signs Height(in): 70 Pulse(bpm): 62 Weight(lbs): 215 Blood Pressure(mmHg): 129/90 Body Mass Index(BMI): 31 Temperature(F): 98.2 Respiratory Rate 16 (breaths/min): Photos: [1:No Photos] [N/A:N/A] Wound Location: [1:Right Head - occiput] [N/A:N/A] Wounding Event: [1:Trauma] [N/A:N/A] Primary Etiology: [1:Trauma, Other] [N/A:N/A] Comorbid History: [1:Sleep Apnea, Arrhythmia, Coronary Artery Disease, Hypertension] [N/A:N/A] Date Acquired: [1:12/30/2016]  [N/A:N/A] Weeks of Treatment: [1:2] [N/A:N/A] Wound Status: [1:Open] [N/A:N/A] Measurements L x W x D [1:0.5x0.5x0.1] [N/A:N/A] (cm) Area (cm) : [1:0.196] [N/A:N/A] Volume (cm) : [1:0.02] [N/A:N/A] % Reduction in Area: [1:98.70%] [N/A:N/A] % Reduction in Volume: [1:98.70%] [N/A:N/A] Classification: [1:Partial Thickness] [N/A:N/A] Exudate Amount: [1:None Present] [N/A:N/A] Wound Margin: [1:Indistinct, nonvisible] [N/A:N/A] Granulation Amount: [1:None Present (0%)] [N/A:N/A] Necrotic Amount: [1:Large (67-100%)] [N/A:N/A] Necrotic Tissue: [1:Eschar] [N/A:N/A] Exposed Structures: [1:Fascia: No Fat Layer (Subcutaneous Tissue) Exposed: No Tendon: No Muscle: No Joint: No Bone: No] [N/A:N/A] Epithelialization: [1:Large (67-100%)] [N/A:N/A] Periwound Skin Texture: [1:Excoriation: No Induration: No Callus: No Crepitus: No Rash: No Scarring: No] [N/A:N/A] Periwound Skin Moisture: [N/A:N/A] Maceration: No Dry/Scaly: No Periwound Skin Color: Atrophie  Blanche: No N/A N/A Cyanosis: No Ecchymosis: No Erythema: No Hemosiderin Staining: No Mottled: No Pallor: No Rubor: No Tenderness on Palpation: Yes N/A N/A Wound Preparation: Ulcer Cleansing: N/A N/A Rinsed/Irrigated with Saline Topical Anesthetic Applied: Other: lidocaine 4% Procedures Performed: CHEM CAUT GRANULATION N/A N/A TISS Treatment Notes Wound #1 (Right Head - occiput) 1. Cleansed with: Clean wound with Normal Saline 2. Anesthetic Topical Lidocaine 4% cream to wound bed prior to debridement 5. Secondary Dressing Applied Dry Gauze Electronic Signature(s) Signed: 07/23/2017 9:07:38 AM By: Lawanda Cousins Entered By: Lawanda Cousins on 07/23/2017 09:07:37 [name] ([id number]) -------------------------------------------------------------------------------- Multi-Disciplinary Care Plan Details Patient Name: [name] Date of Service: 07/23/2017 8:00 AM Medical Record Number: [id number] Patient Account Number:  [id number] Date of Birth/Sex: [birth year] (66 y.o. M) Treating RN: Ahmed Prima Primary Care Redell Nazir: Raechel Ache, DAVID Other Clinician: Referring Haruko Mersch: Raechel Ache, DAVID Treating Jelina Paulsen/Extender: Cathie Olden in Treatment: 2 Active Inactive ` Abuse / Safety / Falls / Self Care Management Nursing Diagnoses: Potential for falls Goals: Patient will not experience any injury related to falls Date Initiated: 07/09/2017 Target Resolution Date: 09/19/2017 Goal Status: Active Interventions: Assess Activities of Daily Living upon admission and as needed Assess fall risk on admission and as needed Assess: immobility, friction, shearing, incontinence upon admission and as needed Assess impairment of mobility on admission and as needed per policy Assess personal safety and home safety (as indicated) on admission and as needed Notes: ` Nutrition Nursing Diagnoses: Imbalanced nutrition Goals: Patient/caregiver agrees to and verbalizes understanding of need to use nutritional supplements and/or vitamins as prescribed Date Initiated: 07/09/2017 Target Resolution Date: 09/19/2017 Goal Status: Active Interventions: Assess patient nutrition upon admission and as needed per policy Notes: ` Orientation to the Wound Care Program Nursing Diagnoses: Knowledge deficit related to the wound healing center program Goals: Patient/caregiver will verbalize understanding of the [place], Godfrey ([id number]) Date Initiated: 07/09/2017 Target Resolution Date: 07/18/2017 Goal Status: Active Interventions: Provide education on orientation to the wound center Notes: ` Wound/Skin Impairment Nursing Diagnoses: Impaired tissue integrity Knowledge deficit related to ulceration/compromised skin integrity Goals: Ulcer/skin breakdown will have a volume reduction of 80% by week 12 Date Initiated: 07/09/2017 Target Resolution Date: 10/17/2017 Goal Status:  Active Interventions: Assess patient/caregiver ability to perform ulcer/skin care regimen upon admission and as needed Assess ulceration(s) every visit Notes: Electronic Signature(s) Signed: 07/24/2017 11:31:29 AM By: Alric Quan Entered By: Alric Quan on 07/23/2017 08:20:38 Fricke, Reynolds [name] ([id number]) -------------------------------------------------------------------------------- Pain Assessment Details Patient Name: [name] Date of Service: 07/23/2017 8:00 AM Medical Record Number: [id number] Patient Account Number: [id number] Date of Birth/Sex: [birth year] (66 y.o. M) Treating RN: Roger Shelter Primary Care Keng Jewel: Raechel Ache, DAVID Other Clinician: Referring Haeden Hudock: Raechel Ache, DAVID Treating Krystine Pabst/Extender: Cathie Olden in Treatment: 2 Active Problems Location of Pain Severity and Description of Pain Patient Has Paino No Site Locations Pain Management and Medication Current Pain Management: Electronic Signature(s) Signed: 07/24/2017 10:58:55 AM By: Roger Shelter Entered By: Roger Shelter on 07/23/2017 08:14:45 Apuzzo, Reynolds [name] ([id number]) -------------------------------------------------------------------------------- Patient/Caregiver Education Details Patient Name: [name] Date of Service: 07/23/2017 8:00 AM Medical Record Number: [id number] Patient Account Number: [id number] Date of Birth/Gender: Aug 03, 1950 (66 y.o. M) Treating RN: Ahmed Prima Primary Care Physician: Raechel Ache, DAVID Other Clinician: Referring Physician: Raechel Ache, DAVID Treating Physician/Extender: Cathie Olden in Treatment: 2 Education Assessment Education Provided To: Patient Education Topics Provided Wound/Skin Impairment: Handouts: Caring for Your Ulcer, Skin Care Do's and Dont's Methods: Demonstration, Explain/Verbal Responses: State content  correctly Electronic Signature(s) Signed: 07/24/2017 11:31:29 AM By: Alric Quan Entered By: Alric Quan on 07/23/2017 08:24:22 [name] ([id number]) -------------------------------------------------------------------------------- Wound Assessment Details Patient Name: [name] Date of Service: 07/23/2017 8:00 AM Medical Record Number: [id number] Patient Account Number: [id number] Date of Birth/Sex: May 12, 1950 (66 y.o. M) Treating RN: Roger Shelter Primary Care Analysia Dungee: Raechel Ache, DAVID Other Clinician: Referring Cherylann Hobday: Raechel Ache, DAVID Treating Judee Hennick/Extender: Cathie Olden in Treatment: 2 Wound Status Wound Number: 1 Primary Trauma, Other Etiology: Wound Location: Right Head - occiput Wound Status: Open Wounding Event: Trauma Comorbid Sleep Apnea, Arrhythmia, Coronary Artery Date Acquired: 12/30/2016 History: Disease, Hypertension Weeks Of Treatment: 2 Clustered Wound: No Wound Measurements Length: (cm) 0.5 Width: (cm) 0.5 Depth: (cm) 0.1 Area: (cm) 0.196 Volume: (cm) 0.02 % Reduction in Area: 98.7% % Reduction in Volume: 98.7% Epithelialization: Large (67-100%) Tunneling: No Undermining: No Wound Description Classification: Partial Thickness Foul Wound Margin: Indistinct, nonvisible Sloug Exudate Amount: None Present Odor After Cleansing: No h/Fibrino No Wound Bed Granulation Amount: None Present (0%) Exposed Structure Necrotic Amount: Large (67-100%) Fascia Exposed: No Necrotic Quality: Eschar Fat Layer (Subcutaneous Tissue) Exposed: No Tendon Exposed: No Muscle Exposed: No Joint Exposed: No Bone Exposed: No Periwound Skin Texture Texture Color No Abnormalities Noted: No No Abnormalities Noted: No Callus: No Atrophie Blanche: No Crepitus: No Cyanosis: No Excoriation: No Ecchymosis: No Induration: No Erythema: No Rash: No Hemosiderin Staining: No Scarring: No Mottled: No Pallor: No Moisture Rubor: No No Abnormalities Noted: No Dry / Scaly: No Temperature / Pain Maceration: No Tenderness on Palpation: Yes Wound  Preparation Ulcer Cleansing: Rinsed/Irrigated with Saline [name], [name] ([id number]) Topical Anesthetic Applied: Other: lidocaine 4%, Treatment Notes Wound #1 (Right Head - occiput) 1. Cleansed with: Clean wound with Normal Saline 2. Anesthetic Topical Lidocaine 4% cream to wound bed prior to debridement 5. Secondary Dressing Applied Dry Gauze Electronic Signature(s) Signed: 07/24/2017 10:58:55 AM By: Roger Shelter Entered By: Roger Shelter on 07/23/2017 08:17:45 Scheeler, Reynolds [name] ([id number]) -------------------------------------------------------------------------------- Vitals Details Patient Name: [name] Date of Service: 07/23/2017 8:00 AM Medical Record Number: [id number] Patient Account Number: [id number] Date of Birth/Sex: Oct 21, 1950 (66 y.o. M) Treating RN: Roger Shelter Primary Care Annmarie Plemmons: Raechel Ache, DAVID Other Clinician: Referring Jaydence Vanyo: Raechel Ache, DAVID Treating Shereese Bonnie/Extender: Cathie Olden in Treatment: 2 Vital Signs Time Taken: 08:14 Temperature (F): 98.2 Height (in): 70 Pulse (bpm): 70 Weight (lbs): 215 Respiratory Rate (breaths/min): 16 Body Mass Index (BMI): 30.8 Blood Pressure (mmHg): 129/90 Reference Range: 80 - 120 mg / dl Electronic Signature(s) Signed: 07/24/2017 10:58:55 AM By: Roger Shelter Entered By: Roger Shelter on 07/23/2017 08:15:04

## 2017-08-06 ENCOUNTER — Encounter: Payer: Medicare Other | Admitting: Nurse Practitioner

## 2017-08-06 DIAGNOSIS — S0101XS Laceration without foreign body of scalp, sequela: Secondary | ICD-10-CM | POA: Diagnosis not present

## 2017-08-13 ENCOUNTER — Encounter: Payer: Medicare Other | Attending: Nurse Practitioner | Admitting: Nurse Practitioner

## 2017-08-13 DIAGNOSIS — S0101XA Laceration without foreign body of scalp, initial encounter: Secondary | ICD-10-CM | POA: Insufficient documentation

## 2017-08-13 DIAGNOSIS — Y939 Activity, unspecified: Secondary | ICD-10-CM | POA: Diagnosis not present

## 2017-08-13 DIAGNOSIS — I10 Essential (primary) hypertension: Secondary | ICD-10-CM | POA: Diagnosis not present

## 2017-08-13 DIAGNOSIS — X58XXXA Exposure to other specified factors, initial encounter: Secondary | ICD-10-CM | POA: Insufficient documentation

## 2017-08-13 DIAGNOSIS — S0005XS Superficial foreign body of scalp, sequela: Secondary | ICD-10-CM | POA: Diagnosis present

## 2017-08-22 NOTE — Progress Notes (Signed)
[name] ([id number]) Visit Report for 08/13/2017 Chief Complaint Document Details Patient Name: [name], [name] Date of Service: 08/13/2017 3:30 PM Medical Record Number: [id number] Patient Account Number: [id number] Date of Birth/Sex: 1950/05/10 (67 y.o. M) Treating RN: [name] Primary Care Provider: Raechel Ache, [name] Other Clinician: Referring Provider: Raechel Ache, [name] Treating Provider/Extender: [name] in Treatment: 5 Information Obtained from: Patient Chief Complaint He is here for parietal/occipital scalp wound Electronic Signature(s) Signed: 08/13/2017 4:43:15 PM By: [name] Entered By: [name] on 08/13/2017 16:43:15 [place], [place]. ([id number]) -------------------------------------------------------------------------------- HPI Details Patient Name: [name] Date of Service: 08/13/2017 3:30 PM Medical Record Number: [id number] Patient Account Number: [id number] Date of Birth/Sex: [birth year] (67 y.o. M) Treating RN: [name] Primary Care Provider: Raechel Ache, [name] Other Clinician: Referring Provider: Raechel Ache, [name] Treating Provider/Extender: [name] in Treatment: 5 History of Present Illness HPI Description: 07/09/17- He is here for initial evaluation for a scalp wound. He reports a fall with laceration in December 30, 2016, stapled close in the ER; his PCP removed staples January 15, 2017. He states there was a scab over the area when staples were removed; he was not instructed on any specific topical treatment. He has continued to have drainage and presents today with a large area of crusted drainage integrated into his hair. All of this was cleaned and trimmed away to reveal a large area of hypergranular tissue. This was treated with debridement and chemical cautery for hemostasis. He will continue with topical antibiotic ointment twice daily and follow up in two weeks. He has been advised, multiple times, to have area covered  with gauze and secure with cap/hat when going out of the home. 07/23/17-He is here for follow-up evaluation of a scalp wound. He continues to apply over-the-counter antibiotic ointment, he admits to keeping this protected when outside. There is a small amount of hypergranuar tissue that was chemically cauterized. He will begin in about appointment on Saturday, applying daily. He will follow-up in 2 weeks as he is unable to make next week's appointment. I expect this area will be healed at that appointment 08/06/17-He is here to follow up evaluation for a scalp wound. This is almost healed with a tiny area of partial-thickness opening. He has not been applying anything topically. Skin prep was applied today. He will follow-up next week 08/13/17- He presents for follow up evaluation for scalp wound. He is healed and be discharged from the wound clinic Electronic Signature(s) Signed: 08/13/2017 4:45:32 PM By: [name] Entered By: [name] on 08/13/2017 16:45:31 [place], [name] ([id number]) -------------------------------------------------------------------------------- Physician Orders Details Patient Name: [name] Date of Service: 08/13/2017 3:30 PM Medical Record Number: [id number] Patient Account Number: [id number] Date of Birth/Sex: 03-Sep-1950 (67 y.o. M) Treating RN: [name] Primary Care Provider: Raechel Ache, [name] Other Clinician: Referring Provider: Raechel Ache, [name] Treating Provider/Extender: [name] in Treatment: 5 Verbal / Phone Orders: No Diagnosis Coding ICD-10 Coding Code Description S01.01XS Laceration without foreign body of scalp, sequela Discharge From [facility] o Discharge from [place] Signature(s) Signed: 08/13/2017 5:03:36 PM By: [name] Signed: 08/13/2017 9:54:05 PM By: [name] Previous Signature: 08/13/2017 4:46:45 PM Version By: [name] Entered By: [name] on 08/13/2017 17:03:35 [name], Paul  [name] ([id number]) -------------------------------------------------------------------------------- Problem List Details Patient Name: [name] Date of Service: 08/13/2017 3:30 PM Medical Record Number: [id number] Patient Account Number: [id number] Date of Birth/Sex: 1951/01/01 (67 y.o. M) Treating RN: [name] Primary Care Provider: Raechel Ache, [name]  Other Clinician: Referring Provider: Raechel Ache, [name] Treating Provider/Extender: [name] in Treatment: 5 Active Problems ICD-10 Evaluated Encounter Code Description Active Date Today Diagnosis S01.01XS Laceration without foreign body of scalp, sequela 07/09/2017 No Yes Inactive Problems Resolved Problems Electronic Signature(s) Signed: 08/13/2017 4:43:01 PM By: [name] Entered By: [name] on 08/13/2017 16:43:01 [name], [name] ([id number]) -------------------------------------------------------------------------------- Progress Note Details Patient Name: [name] Date of Service: 08/13/2017 3:30 PM Medical Record Number: [id number] Patient Account Number: [id number] Date of Birth/Sex: [birth year] (67 y.o. M) Treating RN: [name] Primary Care Provider: Raechel Ache, [name] Other Clinician: Referring Provider: Raechel Ache, [name] Treating Provider/Extender: [name] in Treatment: 5 Subjective Chief Complaint Information obtained from Patient He is here for parietal/occipital scalp wound History of Present Illness (HPI) 07/09/17- He is here for initial evaluation for a scalp wound. He reports a fall with laceration in December 30, 2016, stapled close in the ER; his PCP removed staples January 15, 2017. He states there was a scab over the area when staples were removed; he was not instructed on any specific topical treatment. He has continued to have drainage and presents today with a large area of crusted drainage integrated into his hair. All of this was cleaned and trimmed away to reveal a large area  of hypergranular tissue. This was treated with debridement and chemical cautery for hemostasis. He will continue with topical antibiotic ointment twice daily and follow up in two weeks. He has been advised, multiple times, to have area covered with gauze and secure with cap/hat when going out of the home. 07/23/17-He is here for follow-up evaluation of a scalp wound. He continues to apply over-the-counter antibiotic ointment, he admits to keeping this protected when outside. There is a small amount of hypergranuar tissue that was chemically cauterized. He will begin in about appointment on Saturday, applying daily. He will follow-up in 2 weeks as he is unable to make next week's appointment. I expect this area will be healed at that appointment 08/06/17-He is here to follow up evaluation for a scalp wound. This is almost healed with a tiny area of partial-thickness opening. He has not been applying anything topically. Skin prep was applied today. He will follow-up next week 08/13/17- He presents for follow up evaluation for scalp wound. He is healed and be discharged from the wound clinic Objective Constitutional Vitals Time Taken: 3:39 PM, Height: 70 in, Weight: 215 lbs, BMI: 30.8, Temperature: 98.4 F, Pulse: 75 bpm, Respiratory Rate: 16 breaths/min, Blood Pressure: 120/78 mmHg. Integumentary (Hair, Skin) Wound #1 status is Healed - Epithelialized. Original cause of wound was Trauma. The wound is located on the Right Head - occiput. The wound measures 0cm length x 0cm width x 0cm depth; 0cm^2 area and 0cm^3 volume. There is no tunneling or undermining noted. There is a none present amount of drainage noted. The wound margin is indistinct and nonvisible. There is no granulation within the wound bed. There is no necrotic tissue within the wound bed. The periwound skin appearance did not exhibit: Callus, Crepitus, Excoriation, Induration, Rash, Scarring, Dry/Scaly, Maceration, Atrophie Blanche,  Cyanosis, Ecchymosis, Hemosiderin Staining, Mottled, Pallor, Rubor, Erythema. The periwound has tenderness on palpation. [name], [name] ([id number]) Assessment Active Problems ICD-10 Laceration without foreign body of scalp, sequela Plan Discharge From [facility]: Discharge from [place] Signature(s) Signed: 08/18/2017 6:37:32 PM By: [name] Previous Signature: 08/13/2017 4:46:55 PM Version By: [name] Entered By: [name] on 08/18/2017 18:37:31 [name], [name] ([id number]) --------------------------------------------------------------------------------  SuperBill Details Patient Name: [name], [name] Date of Service: 08/13/2017 Medical Record Number: [id number] Patient Account Number: [id number] Date of Birth/Sex: 05/25/1950 (67 y.o. M) Treating RN: [name] Primary Care Provider: Raechel Ache, [name] Other Clinician: Referring Provider: Raechel Ache, [name] Treating Provider/Extender: [name] in Treatment: 5 Diagnosis Coding ICD-10 Codes Code Description S01.01XS Laceration without foreign body of scalp, sequela Facility Procedures CPT4 Code: [id number] Description: [id number] - WOUND CARE VISIT-LEV 2 EST PT Modifier: Quantity: 1 Physician Procedures CPT4 Code: [id number] Description: 56256 - WC PHYS LEVEL 2 - EST PT ICD-10 Diagnosis Description S01.01XS Laceration without foreign body of scalp, sequela Modifier: Quantity: 1 Electronic Signature(s) Signed: 08/13/2017 5:06:03 PM By: [name] Signed: 08/13/2017 9:54:05 PM By: [name] Previous Signature: 08/13/2017 4:47:16 PM Version By: [name] Entered By: [name] on 08/13/2017 17:06:02

## 2017-08-26 NOTE — Progress Notes (Signed)
[name], [name] ([id number]) Visit Report for 08/13/2017 Arrival Information Details Patient Name: [name], [name] Date of Service: 08/13/2017 3:30 PM Medical Record Number: [id number] Patient Account Number: [id number] Date of Birth/Sex: 12/05/1950 (66 y.o. M) Treating RN: Cornell Barman Primary Care Janele Lague: Raechel Ache, DAVID Other Clinician: Referring Dionna Wiedemann: Raechel Ache, DAVID Treating Donathan Buller/Extender: Cathie Olden in Treatment: 5 Visit Information History Since Last Visit Added or deleted any medications: No Patient Arrived: Ambulatory Any new allergies or adverse reactions: No Arrival Time: 15:57 Had a fall or experienced change in No Accompanied By: self activities of daily living that may affect Transfer Assistance: None risk of falls: Patient Identification Verified: Yes Signs or symptoms of abuse/neglect since last visito No Secondary Verification Process Yes Hospitalized since last visit: No Completed: Implantable device outside of the clinic excluding No Patient Has Alerts: Yes cellular tissue based products placed in the center Patient Alerts: Patient on Blood since last visit: Thinner Has Dressing in Place as Prescribed: Yes plavix Pain Present Now: No Electronic Signature(s) Signed: 08/17/2017 8:06:36 AM By: Alric Quan Previous Signature: 08/14/2017 6:17:48 PM Version By: Gretta Cool, BSN, RN, CWS, Kim RN, BSN Entered By: Alric Quan on 08/17/2017 08:06:35 [name], [name] ([id number]) -------------------------------------------------------------------------------- Clinic Level of Care Assessment Details Patient Name: [name] Date of Service: 08/13/2017 3:30 PM Medical Record Number: [id number] Patient Account Number: [id number] Date of Birth/Sex: 03-03-1950 (66 y.o. M) Treating RN: Ahmed Prima Primary Care Andrik Sandt: Raechel Ache, DAVID Other Clinician: Referring Whitlee Sluder: Raechel Ache, DAVID Treating Wyman Meschke/Extender: Cathie Olden in Treatment: 5 Clinic  Level of Care Assessment Items TOOL 4 Quantity Score X - Use when only an EandM is performed on FOLLOW-UP visit 1 0 ASSESSMENTS - Nursing Assessment / Reassessment X - Reassessment of Co-morbidities (includes updates in patient status) 1 10 X- 1 5 Reassessment of Adherence to Treatment Plan ASSESSMENTS - Wound and Skin Assessment / Reassessment X - Simple Wound Assessment / Reassessment - one wound 1 5 []  - 0 Complex Wound Assessment / Reassessment - multiple wounds []  - 0 Dermatologic / Skin Assessment (not related to wound area) ASSESSMENTS - Focused Assessment []  - Circumferential Edema Measurements - multi extremities 0 []  - 0 Nutritional Assessment / Counseling / Intervention []  - 0 Lower Extremity Assessment (monofilament, tuning fork, pulses) []  - 0 Peripheral Arterial Disease Assessment (using hand held doppler) ASSESSMENTS - Ostomy and/or Continence Assessment and Care []  - Incontinence Assessment and Management 0 []  - 0 Ostomy Care Assessment and Management (repouching, etc.) PROCESS - Coordination of Care X - Simple Patient / Family Education for ongoing care 1 15 []  - 0 Complex (extensive) Patient / Family Education for ongoing care []  - 0 Staff obtains Programmer, systems, Records, Test Results / Process Orders []  - 0 Staff telephones HHA, Nursing Homes / Clarify orders / etc []  - 0 Routine Transfer to another Facility (non-emergent condition) []  - 0 Routine Hospital Admission (non-emergent condition) []  - 0 New Admissions / Biomedical engineer / Ordering NPWT, Apligraf, etc. []  - 0 Emergency Hospital Admission (emergent condition) X- 1 10 Simple Discharge Coordination Calles, Sallie L. ([id number]) []  - 0 Complex (extensive) Discharge Coordination PROCESS - Special Needs []  - Pediatric / Minor Patient Management 0 []  - 0 Isolation Patient Management []  - 0 Hearing / Language / Visual special needs []  - 0 Assessment of Community assistance (transportation,  D/C planning, etc.) []  - 0 Additional assistance / Altered mentation []  - 0 Support Surface(s) Assessment (bed, cushion, seat, etc.) INTERVENTIONS - Wound Cleansing / Measurement  X - Simple Wound Cleansing - one wound 1 5 []  - 0 Complex Wound Cleansing - multiple wounds X- 1 5 Wound Imaging (photographs - any number of wounds) []  - 0 Wound Tracing (instead of photographs) []  - 0 Simple Wound Measurement - one wound []  - 0 Complex Wound Measurement - multiple wounds INTERVENTIONS - Wound Dressings []  - Small Wound Dressing one or multiple wounds 0 []  - 0 Medium Wound Dressing one or multiple wounds []  - 0 Large Wound Dressing one or multiple wounds []  - 0 Application of Medications - topical []  - 0 Application of Medications - injection INTERVENTIONS - Miscellaneous []  - External ear exam 0 []  - 0 Specimen Collection (cultures, biopsies, blood, body fluids, etc.) []  - 0 Specimen(s) / Culture(s) sent or taken to Lab for analysis []  - 0 Patient Transfer (multiple staff / Civil Service fast streamer / Similar devices) []  - 0 Simple Staple / Suture removal (25 or less) []  - 0 Complex Staple / Suture removal (26 or more) []  - 0 Hypo / Hyperglycemic Management (close monitor of Blood Glucose) []  - 0 Ankle / Brachial Index (ABI) - do not check if billed separately X- 1 5 Vital Signs [name], [name] ([id number]) Has the patient been seen at the hospital within the last three years: Yes Total Score: 60 Level Of Care: New/Established - Level 2 Electronic Signature(s) Signed: 08/17/2017 4:59:33 PM By: Alric Quan Entered By: Alric Quan on 08/13/2017 17:05:48 Eberlin, [name] ([id number]) -------------------------------------------------------------------------------- Encounter Discharge Information Details Patient Name: [name] Date of Service: 08/13/2017 3:30 PM Medical Record Number: [id number] Patient Account Number: [id number] Date of Birth/Sex: 11-May-1950 (67 y.o.  M) Treating RN: Ahmed Prima Primary Care Bettie Capistran: Raechel Ache, DAVID Other Clinician: Referring Mireyah Chervenak: Raechel Ache, DAVID Treating Tavaris Eudy/Extender: Cathie Olden in Treatment: 5 Encounter Discharge Information Items Discharge Condition: Stable Ambulatory Status: Ambulatory Discharge Destination: Home Transportation: Private Auto Accompanied By: self Schedule Follow-up Appointment: No Clinical Summary of Care: Electronic Signature(s) Signed: 08/13/2017 5:04:45 PM By: Alric Quan Entered By: Alric Quan on 08/13/2017 17:04:44 [name], [name] ([id number]) -------------------------------------------------------------------------------- Lower Extremity Assessment Details Patient Name: [name] Date of Service: 08/13/2017 3:30 PM Medical Record Number: [id number] Patient Account Number: [id number] Date of Birth/Sex: Jul 08, 1950 (66 y.o. M) Treating RN: Cornell Barman Primary Care Chablis Losh: Raechel Ache, DAVID Other Clinician: Referring Carvin Almas: Raechel Ache, DAVID Treating Rhys Anchondo/Extender: Cathie Olden in Treatment: 5 Electronic Signature(s) Signed: 08/14/2017 6:17:48 PM By: Gretta Cool, BSN, RN, CWS, Kim RN, BSN Entered By: Gretta Cool, BSN, RN, CWS, Kim on 08/13/2017 16:02:26 [name], [name] ([id number]) -------------------------------------------------------------------------------- Multi Wound Chart Details Patient Name: [name] Date of Service: 08/13/2017 3:30 PM Medical Record Number: [id number] Patient Account Number: [id number] Date of Birth/Sex: May 03, 1950 (66 y.o. M) Treating RN: Ahmed Prima Primary Care Britini Garcilazo: Raechel Ache, DAVID Other Clinician: Referring Pharell Rolfson: Raechel Ache, DAVID Treating Shandon Burlingame/Extender: Cathie Olden in Treatment: 5 Vital Signs Height(in): 70 Pulse(bpm): 75 Weight(lbs): 215 Blood Pressure(mmHg): 120/78 Body Mass Index(BMI): 31 Temperature(F): 98.4 Respiratory Rate 16 (breaths/min): Photos: [1:No Photos] [N/A:N/A] Wound Location:  [1:Right Head - occiput] [N/A:N/A] Wounding Event: [1:Trauma] [N/A:N/A] Primary Etiology: [1:Trauma, Other] [N/A:N/A] Comorbid History: [1:Sleep Apnea, Arrhythmia, Coronary Artery Disease, Hypertension] [N/A:N/A] Date Acquired: [1:12/30/2016] [N/A:N/A] Weeks of Treatment: [1:5] [N/A:N/A] Wound Status: [1:Healed - Epithelialized] [N/A:N/A] Measurements L x W x D [1:0x0x0] [N/A:N/A] (cm) Area (cm) : [1:0] [N/A:N/A] Volume (cm) : [1:0] [N/A:N/A] % Reduction in Area: [1:100.00%] [N/A:N/A] % Reduction in Volume: [1:100.00%] [N/A:N/A] Classification: [1:Partial Thickness] [N/A:N/A] Exudate Amount: [1:None Present] [N/A:N/A] Wound  Margin: [1:Indistinct, nonvisible] [N/A:N/A] Granulation Amount: [1:None Present (0%)] [N/A:N/A] Necrotic Amount: [1:None Present (0%)] [N/A:N/A] Exposed Structures: [1:Fascia: No Fat Layer (Subcutaneous Tissue) Exposed: No Tendon: No Muscle: No Joint: No Bone: No] [N/A:N/A] Epithelialization: [1:Large (67-100%)] [N/A:N/A] Periwound Skin Texture: [1:Excoriation: No Induration: No Callus: No Crepitus: No Rash: No Scarring: No] [N/A:N/A] Periwound Skin Moisture: [1:Maceration: No Dry/Scaly: No] [N/A:N/A] Periwound Skin Color: Atrophie Blanche: No N/A N/A Cyanosis: No Ecchymosis: No Erythema: No Hemosiderin Staining: No Mottled: No Pallor: No Rubor: No Tenderness on Palpation: Yes N/A N/A Wound Preparation: Ulcer Cleansing: Not Cleansed N/A N/A Topical Anesthetic Applied: None Treatment Notes Electronic Signature(s) Signed: 08/13/2017 5:04:17 PM By: Alric Quan Entered By: Alric Quan on 08/13/2017 17:04:17 [name], [name] ([id number]) -------------------------------------------------------------------------------- [place] Details Patient Name: [name] Date of Service: 08/13/2017 3:30 PM Medical Record Number: [id number] Patient Account Number: [id number] Date of Birth/Sex: [birth year] (66 y.o. M) Treating RN:  Ahmed Prima Primary Care Mishell Donalson: Raechel Ache, DAVID Other Clinician: Referring Hula Tasso: Raechel Ache, DAVID Treating Dannetta Lekas/Extender: Cathie Olden in Treatment: 5 Active Inactive Electronic Signature(s) Signed: 08/13/2017 5:04:06 PM By: Alric Quan Previous Signature: 08/13/2017 5:03:59 PM Version By: Alric Quan Entered By: Alric Quan on 08/13/2017 17:04:06 [name], [name] ([id number]) -------------------------------------------------------------------------------- Pain Assessment Details Patient Name: [name] Date of Service: 08/13/2017 3:30 PM Medical Record Number: [id number] Patient Account Number: [id number] Date of Birth/Sex: Apr 20, 1950 (67 y.o. M) Treating RN: Cornell Barman Primary Care Jaelyne Deeg: Raechel Ache, DAVID Other Clinician: Referring Ajani Rineer: Raechel Ache, DAVID Treating Dwayn Moravek/Extender: Cathie Olden in Treatment: 5 Active Problems Location of Pain Severity and Description of Pain Patient Has Paino No Site Locations Pain Management and Medication Current Pain Management: Electronic Signature(s) Signed: 08/14/2017 6:17:48 PM By: Gretta Cool, BSN, RN, CWS, Kim RN, BSN Entered By: Gretta Cool, BSN, RN, CWS, Kim on 08/13/2017 15:59:00 [name] ([id number]) -------------------------------------------------------------------------------- Patient/Caregiver Education Details Patient Name: [name] Date of Service: 08/13/2017 3:30 PM Medical Record Number: [id number] Patient Account Number: [id number] Date of Birth/Gender: Oct 23, 1950 (66 y.o. M) Treating RN: Ahmed Prima Primary Care Physician: Raechel Ache, DAVID Other Clinician: Referring Physician: Raechel Ache, DAVID Treating Physician/Extender: Cathie Olden in Treatment: 5 Education Assessment Education Provided To: Patient Education Topics Provided Wound/Skin Impairment: Handouts: Other: Call our office if you have any questions or concerns. Methods: Explain/Verbal Responses: State content  correctly Electronic Signature(s) Signed: 08/17/2017 4:59:33 PM By: Alric Quan Entered By: Alric Quan on 08/13/2017 17:05:10 [name], [name] ([id number]) -------------------------------------------------------------------------------- Wound Assessment Details Patient Name: [name] Date of Service: 08/13/2017 3:30 PM Medical Record Number: [id number] Patient Account Number: [id number] Date of Birth/Sex: [birth year] (66 y.o. M) Treating RN: Cornell Barman Primary Care Temekia Caskey: Raechel Ache, DAVID Other Clinician: Referring Ketty Bitton: Raechel Ache, DAVID Treating Plummer Matich/Extender: Cathie Olden in Treatment: 5 Wound Status Wound Number: 1 Primary Trauma, Other Etiology: Wound Location: Right Head - occiput Wound Status: Healed - Epithelialized Wounding Event: Trauma Comorbid Sleep Apnea, Arrhythmia, Coronary Artery Date Acquired: 12/30/2016 History: Disease, Hypertension Weeks Of Treatment: 5 Clustered Wound: No Photos Photo Uploaded By: Gretta Cool, BSN, RN, CWS, Kim on 08/14/2017 18:09:17 Wound Measurements Length: (cm) 0 % Reduc Width: (cm) 0 % Reduc Depth: (cm) 0 Epithel Area: (cm) 0 Tunnel Volume: (cm) 0 Underm tion in Area: 100% tion in Volume: 100% ialization: Large (67-100%) ing: No ining: No Wound Description Classification: Partial Thickness Foul Od Wound Margin: Indistinct, nonvisible Slough/ Exudate Amount: None Present or After Cleansing: No Fibrino No Wound Bed Granulation Amount: None Present (0%) Exposed Structure Necrotic Amount: None Present (  0%) Fascia Exposed: No Fat Layer (Subcutaneous Tissue) Exposed: No Tendon Exposed: No Muscle Exposed: No Joint Exposed: No Bone Exposed: No Periwound Skin Texture Texture Color No Abnormalities Noted: No No Abnormalities Noted: No [name], [name] ([id number]) Callus: No Atrophie Blanche: No Crepitus: No Cyanosis: No Excoriation: No Ecchymosis: No Induration: No Erythema: No Rash: No Hemosiderin  Staining: No Scarring: No Mottled: No Pallor: No Moisture Rubor: No No Abnormalities Noted: No Dry / Scaly: No Temperature / Pain Maceration: No Tenderness on Palpation: Yes Wound Preparation Ulcer Cleansing: Not Cleansed Topical Anesthetic Applied: None Electronic Signature(s) Signed: 08/14/2017 6:17:48 PM By: Gretta Cool, BSN, RN, CWS, Kim RN, BSN Entered By: Gretta Cool, BSN, RN, CWS, Kim on 08/13/2017 16:02:16 [name] ([id number]) -------------------------------------------------------------------------------- [name] Details Patient Name: [name] Date of Service: 08/13/2017 3:30 PM Medical Record Number: [id number] Patient Account Number: [id number] Date of Birth/Sex: 1950-10-26 (66 y.o. M) Treating RN: Cornell Barman Primary Care Theresa Dohrman: Raechel Ache, DAVID Other Clinician: Referring Gannon Heinzman: Raechel Ache, DAVID Treating Lilyanne Mcquown/Extender: Cathie Olden in Treatment: 5 Vital Signs Time Taken: 15:39 Temperature (F): 98.4 Height (in): 70 Pulse (bpm): 75 Weight (lbs): 215 Respiratory Rate (breaths/min): 16 Body Mass Index (BMI): 30.8 Blood Pressure (mmHg): 120/78 Reference Range: 80 - 120 mg / dl Electronic Signature(s) Signed: 08/14/2017 6:17:48 PM By: Gretta Cool, BSN, RN, CWS, Kim RN, BSN Entered By: Gretta Cool, BSN, RN, CWS, Kim on 08/13/2017 15:59:32

## 2017-09-29 ENCOUNTER — Other Ambulatory Visit: Payer: Self-pay

## 2017-09-29 ENCOUNTER — Encounter: Payer: Self-pay | Admitting: Emergency Medicine

## 2017-09-29 ENCOUNTER — Emergency Department
Admission: EM | Admit: 2017-09-29 | Discharge: 2017-09-29 | Disposition: A | Discharge status: Home / Self Care | Payer: Medicare Other | Attending: Emergency Medicine | Admitting: Emergency Medicine

## 2017-09-29 DIAGNOSIS — Z79899 Other long term (current) drug therapy: Secondary | ICD-10-CM | POA: Diagnosis not present

## 2017-09-29 DIAGNOSIS — Y998 Other external cause status: Secondary | ICD-10-CM | POA: Insufficient documentation

## 2017-09-29 DIAGNOSIS — Y9389 Activity, other specified: Secondary | ICD-10-CM | POA: Insufficient documentation

## 2017-09-29 DIAGNOSIS — I4891 Unspecified atrial fibrillation: Secondary | ICD-10-CM | POA: Insufficient documentation

## 2017-09-29 DIAGNOSIS — I10 Essential (primary) hypertension: Secondary | ICD-10-CM | POA: Diagnosis not present

## 2017-09-29 DIAGNOSIS — Y33XXXA Other specified events, undetermined intent, initial encounter: Secondary | ICD-10-CM | POA: Diagnosis not present

## 2017-09-29 DIAGNOSIS — Z7982 Long term (current) use of aspirin: Secondary | ICD-10-CM | POA: Insufficient documentation

## 2017-09-29 DIAGNOSIS — S3992XA Unspecified injury of lower back, initial encounter: Secondary | ICD-10-CM | POA: Diagnosis present

## 2017-09-29 DIAGNOSIS — Y929 Unspecified place or not applicable: Secondary | ICD-10-CM | POA: Insufficient documentation

## 2017-09-29 DIAGNOSIS — S39012A Strain of muscle, fascia and tendon of lower back, initial encounter: Secondary | ICD-10-CM | POA: Diagnosis not present

## 2017-09-29 DIAGNOSIS — I2510 Atherosclerotic heart disease of native coronary artery without angina pectoris: Secondary | ICD-10-CM | POA: Diagnosis not present

## 2017-09-29 MED ORDER — METHOCARBAMOL 500 MG PO TABS: 500.0000 mg | ORAL_TABLET | Freq: Three times a day (TID) | ORAL | 0 refills | Status: DC | PRN

## 2017-09-29 MED ORDER — DEXAMETHASONE SODIUM PHOSPHATE 10 MG/ML IJ SOLN
10.0000 mg | Freq: Once | INTRAMUSCULAR | Status: AC
  Administered 2017-09-29: 10 mg via INTRAMUSCULAR
  Filled 2017-09-29: qty 1

## 2017-09-29 MED ORDER — ORPHENADRINE CITRATE 30 MG/ML IJ SOLN
60.0000 mg | Freq: Once | INTRAMUSCULAR | Status: AC
  Administered 2017-09-29: 60 mg via INTRAMUSCULAR
  Filled 2017-09-29: qty 2

## 2017-09-29 MED ORDER — MELOXICAM 7.5 MG PO TABS: 7.5000 mg | ORAL_TABLET | Freq: Every day | ORAL | 0 refills | Status: AC

## 2017-09-29 NOTE — ED Triage Notes (Signed)
Patient ambulatory to triage with steady gait, without difficulty or distress noted; pt reports lower back pain radiating into legs; st was lifting heavy boxes and thinks he may have strained his back; denies hx of same

## 2017-09-29 NOTE — ED Provider Notes (Signed)
[facility] Emergency Department Provider Note  ____________________________________________  Time seen: Approximately 10:38 PM  I have reviewed the triage vital signs and the nursing notes.   HISTORY  Chief Complaint Back Pain    HPI [name] is a 67 y.o. male who presents the emergency department complaining of lower back pain with radicular symptoms into the left lower extremity.  Patient reports that he was lifting heavy items several days ago, developed some tightness in his lower back.  Initially, patient thought it would resolve but has worsened.  He denies any abdominal pain, urinary symptoms.  Patient states that the pain does radiate from the lower back into the left leg.  No numbness or tingling in the foot.  No bowel or bladder dysfunction, saddle anesthesia, paresthesias.  No other complaints at this time.    Past Medical History:  Diagnosis Date  . Atrial fibrillation ([place])   . CAD (coronary artery disease)    chronically occluded RCA  . Dysrhythmia    a fib  . Hypertension   . Sleep apnea   . Testicle pain     Patient Active Problem List   Diagnosis Date Noted  . Syncope 12/30/2016  . Sleep apnea 11/08/2015  . S/P ablation of atrial flutter 11/30/2013  . A-fib ([place]) 11/19/2012  . CAD (coronary artery disease) 11/19/2012  . Atrial flutter ([place]) 11/18/2012  . RCA occlusion ([place]) 11/18/2012    Past Surgical History:  Procedure Laterality Date  . APPENDECTOMY    . COLONOSCOPY    . CYSTOSCOPY WITH BIOPSY N/A 01/02/2016   Procedure: CYSTOSCOPY WITH BIOPSY;  Surgeon: [name], MD;  Location: [facility] ORS;  Service: Urology;  Laterality: N/A;  . heart ablation    . HERNIA REPAIR    . LOOP RECORDER INSERTION N/A 01/14/2017   Procedure: LOOP RECORDER INSERTION;  Surgeon: [name], MD;  Location: [place] [facility];  Service: Cardiovascular;  Laterality: N/A;  . staples to head (posterior Right) Right 12/2016   fell  and hit back of head   . TONSILLECTOMY    . VASECTOMY      Prior to Admission medications   Medication Sig Start Date End Date Taking? Authorizing Provider  acetaminophen (TYLENOL) 500 MG tablet Take 500 mg by mouth every 6 (six) hours as needed for mild pain (fell and hit head 1 week ago).    [provider]  aspirin EC 81 MG tablet Take 81 mg by mouth every evening.     [provider]  Carboxymethylcellul-Glycerin (LUBRICATING EYE DROPS OP) Place 1 drop into both eyes daily as needed (dry eyes).    [provider]  cetirizine (ZYRTEC) 10 MG tablet Take 10 mg by mouth daily.    [provider]  finasteride (PROSCAR) 5 MG tablet TAKE ONE TABLET BY MOUTH ONCE DAILY 02/24/17   Ernestine Conrad, Larene Beach A, PA-C  finasteride (PROSCAR) 5 MG tablet Take 1 tablet (5 mg total) by mouth daily. 02/19/17   [name], MD  ibuprofen (ADVIL,MOTRIN) 200 MG tablet Take 200 mg by mouth daily as needed for headache or moderate pain.    [provider]  lovastatin (MEVACOR) 20 MG tablet Take 40 mgs by mouth once daily in the evening 04/16/15   [provider]  metoprolol tartrate (LOPRESSOR) 25 MG tablet Take 12.5 mg by mouth 2 (two) times daily.  01/26/15   [provider]  Multiple Vitamin (MULTI-VITAMINS) TABS Take 0.5 tablets by mouth 2 (two) times  daily.     [provider]  nicotine polacrilex (NICORETTE) 2 MG gum Take 2 mg by mouth as needed for smoking cessation.    [provider]  tamsulosin (FLOMAX) 0.4 MG CAPS capsule TAKE ONE CAPSULE BY MOUTH ONCE DAILY 02/24/17   Ernestine Conrad, Larene Beach A, PA-C  tamsulosin (FLOMAX) 0.4 MG CAPS capsule Take 1 capsule (0.4 mg total) by mouth daily. 02/19/17   [name], MD    Allergies Patient has no known allergies.  Family History  Problem Relation Age of Onset  . Nephrotic syndrome Son   . CAD Mother   . Diabetes Mother   . Throat cancer Father   . Prostate cancer Neg Hx   . Kidney  cancer Neg Hx   . Bladder Cancer Neg Hx     Social History Social History   Tobacco Use  . Smoking status: Never Smoker  . Smokeless tobacco: Former Systems developer    Types: Chew  Substance Use Topics  . Alcohol use: Yes    Alcohol/week: 1.0 standard drinks    Types: 1 Cans of beer per week    Comment: occassional  . Drug use: No     Review of Systems  Constitutional: No fever/chills Eyes: No visual changes.  Cardiovascular: no chest pain. Respiratory: no cough. No SOB. Gastrointestinal: No abdominal pain.  No nausea, no vomiting.  No diarrhea.  No constipation. Genitourinary: Negative for dysuria. No hematuria Musculoskeletal: Positive for lower back pain radiating into the left hip. Skin: Negative for rash, abrasions, lacerations, ecchymosis. Neurological: Negative for headaches, focal weakness or numbness. 10-point ROS otherwise negative.  ____________________________________________   PHYSICAL EXAM:  VITAL SIGNS: ED Triage Vitals [09/29/17 2202]  Enc Vitals Group     BP (!) 144/81     Pulse Rate 66     Resp 18     Temp 98.5 F (36.9 C)     Temp Source Oral     SpO2 97 %     Weight 215 lb (97.5 kg)     Height 5\' 10"  (1.778 m)     Head Circumference      Peak Flow      Pain Score 7     Pain Loc      Pain Edu?      Excl. in [place]?      Constitutional: Alert and oriented. Well appearing and in no acute distress. Eyes: Conjunctivae are normal. PERRL. EOMI. Head: Atraumatic. ENT:      Ears:       Nose: No congestion/rhinnorhea.      Mouth/Throat: Mucous membranes are moist.  Neck: No stridor.    Cardiovascular: Normal rate, regular rhythm. Normal S1 and S2.  Good peripheral circulation. Respiratory: Normal respiratory effort without tachypnea or retractions. Lungs CTAB. Good air entry to the bases with no decreased or absent breath sounds. Gastrointestinal: Bowel sounds 4 quadrants. Soft and nontender to palpation. No guarding or rigidity. No palpable masses. No  distention. No CVA tenderness. Musculoskeletal: Full range of motion to all extremities. No gross deformities appreciated.  Visualization of the lumbar spine reveals no obvious deformity or abnormality.  Patient is tender to palpation left paraspinal muscle group and over the SI joint.  No tenderness to palpation.  Over the spine.  Patient is minimally tender to palpation left sciatic notch.  Negative straight leg raise bilaterally.  Dorsalis pedis pulse intact bilateral lower extremities.  Sensation intact and equal in all dermatomal distributions bilateral lower extremities. Neurologic:  Normal  speech and language. No gross focal neurologic deficits are appreciated.  Skin:  Skin is warm, dry and intact. No rash noted. Psychiatric: Mood and affect are normal. Speech and behavior are normal. Patient exhibits appropriate insight and judgement.   ____________________________________________   LABS (all labs ordered are listed, but only abnormal results are displayed)  Labs Reviewed - No data to display ____________________________________________  EKG   ____________________________________________  RADIOLOGY   No results found.  ____________________________________________    PROCEDURES  Procedure(s) performed:    Procedures    Medications - No data to display   ____________________________________________   INITIAL IMPRESSION / ASSESSMENT AND PLAN / ED COURSE  Pertinent labs & imaging results that were available during my care of the patient were reviewed by me and considered in my medical decision making (see chart for details).  Review of the [place] CSRS was performed in accordance of the [place] prior to dispensing any controlled drugs.      Patient's diagnosis is consistent with lumbar strain.  Patient presents emergency department with pain in lower back.  No urinary or GI symptoms.  Findings are consistent with strain with mild radicular symptoms.  Patient is given  Decadron and Norflex injections in the emergency department.. Patient will be discharged home with prescriptions for meloxicam and Robaxin for additional symptom control. Patient is to follow up with primary care as needed or otherwise directed. Patient is given ED precautions to return to the ED for any worsening or new symptoms.     ____________________________________________  FINAL CLINICAL IMPRESSION(S) / ED DIAGNOSES  Final diagnoses:  None      NEW MEDICATIONS STARTED DURING THIS VISIT:  ED Discharge Orders    None          This chart was dictated using voice recognition software/Dragon. Despite best efforts to proofread, errors can occur which can change the meaning. Any change was purely unintentional.    Darletta Moll, PA-C 09/29/17 [id number]    Schuyler Amor, MD 09/29/17 [phone]

## 2017-10-01 ENCOUNTER — Other Ambulatory Visit: Payer: Self-pay

## 2017-10-01 ENCOUNTER — Ambulatory Visit
Admission: EM | Admit: 2017-10-01 | Discharge: 2017-10-01 | Disposition: A | Discharge status: Home / Self Care | Payer: Medicare Other | Attending: Emergency Medicine | Admitting: Emergency Medicine

## 2017-10-01 ENCOUNTER — Ambulatory Visit: Payer: Medicare Other

## 2017-10-01 DIAGNOSIS — Z79899 Other long term (current) drug therapy: Secondary | ICD-10-CM | POA: Insufficient documentation

## 2017-10-01 DIAGNOSIS — Z8249 Family history of ischemic heart disease and other diseases of the circulatory system: Secondary | ICD-10-CM | POA: Diagnosis not present

## 2017-10-01 DIAGNOSIS — I10 Essential (primary) hypertension: Secondary | ICD-10-CM | POA: Insufficient documentation

## 2017-10-01 DIAGNOSIS — X500XXA Overexertion from strenuous movement or load, initial encounter: Secondary | ICD-10-CM | POA: Insufficient documentation

## 2017-10-01 DIAGNOSIS — Z7982 Long term (current) use of aspirin: Secondary | ICD-10-CM | POA: Insufficient documentation

## 2017-10-01 DIAGNOSIS — S39012A Strain of muscle, fascia and tendon of lower back, initial encounter: Secondary | ICD-10-CM | POA: Insufficient documentation

## 2017-10-01 DIAGNOSIS — I2510 Atherosclerotic heart disease of native coronary artery without angina pectoris: Secondary | ICD-10-CM | POA: Insufficient documentation

## 2017-10-01 DIAGNOSIS — G4730 Sleep apnea, unspecified: Secondary | ICD-10-CM | POA: Insufficient documentation

## 2017-10-01 DIAGNOSIS — I4891 Unspecified atrial fibrillation: Secondary | ICD-10-CM | POA: Diagnosis not present

## 2017-10-01 DIAGNOSIS — Z7901 Long term (current) use of anticoagulants: Secondary | ICD-10-CM | POA: Insufficient documentation

## 2017-10-01 DIAGNOSIS — M545 Low back pain: Secondary | ICD-10-CM | POA: Diagnosis present

## 2017-10-01 MED ORDER — KETOROLAC TROMETHAMINE 60 MG/2ML IM SOLN
30.0000 mg | Freq: Once | INTRAMUSCULAR | Status: AC
  Administered 2017-10-01: 30 mg via INTRAMUSCULAR

## 2017-10-01 MED ORDER — ORPHENADRINE CITRATE ER 100 MG PO TB12: 100.0000 mg | ORAL_TABLET | Freq: Two times a day (BID) | ORAL | 0 refills | Status: DC

## 2017-10-01 MED ORDER — METHYLPREDNISOLONE 4 MG PO TBPK: ORAL_TABLET | ORAL | 0 refills | Status: DC

## 2017-10-01 NOTE — ED Provider Notes (Signed)
[facility]    CSN: [id number] Arrival date & time: 10/01/17  1407     History   Chief Complaint Chief Complaint  Patient presents with  . Back Pain    HPI [name] is a 67 y.o. male.   HPI  67 year old male since with radiating low back pain more on the right than the left.  States that he had been lifting heavy items which is usual for him as part of his job.  Remember specific incident that may have precipitated his pain.  Seen at the [facility] emergency department 2 nights ago given Decadron and Norflex injection which helped immensely immediately.  He states he was sent home on Mobic and Robaxin.  He is taken 1 pill of Mobic and several Robaxin medications which have not been beneficial but he states that he has been using heat but is found it difficult since he has pain with standing or sitting.  Review of his medical records shows that he has recently had to see if his bladder with the removal of several polyps.  His follow-ups have been well with no polyps seen by cystoscopy.  He has noticed a his bowel habits where he normally went on a daily basis but now has noticed that the only 2-3 times per week.  He has changed his diet somewhat in an attempt to lose weight.  He said no fever or chills.  He has had no urinary incontinence.           Past Medical History:  Diagnosis Date  . Atrial fibrillation ([place])   . CAD (coronary artery disease)    chronically occluded RCA  . Dysrhythmia    a fib  . Hypertension   . Sleep apnea   . Testicle pain     Patient Active Problem List   Diagnosis Date Noted  . Syncope 12/30/2016  . Sleep apnea 11/08/2015  . S/P ablation of atrial flutter 11/30/2013  . A-fib ([place]) 11/19/2012  . CAD (coronary artery disease) 11/19/2012  . Atrial flutter ([place]) 11/18/2012  . RCA occlusion ([place]) 11/18/2012    Past Surgical History:  Procedure Laterality Date  . APPENDECTOMY    . COLONOSCOPY    . CYSTOSCOPY WITH BIOPSY N/A  01/02/2016   Procedure: CYSTOSCOPY WITH BIOPSY;  Surgeon: [name], MD;  Location: [facility] ORS;  Service: Urology;  Laterality: N/A;  . heart ablation    . HERNIA REPAIR    . LOOP RECORDER INSERTION N/A 01/14/2017   Procedure: LOOP RECORDER INSERTION;  Surgeon: [name], MD;  Location: [place] [facility];  Service: Cardiovascular;  Laterality: N/A;  . staples to head (posterior Right) Right 12/2016   fell and hit back of head   . TONSILLECTOMY    . VASECTOMY         Home Medications    Prior to Admission medications   Medication Sig Start Date End Date Taking? Authorizing Provider  amLODipine (NORVASC) 5 MG tablet Take 5 mg by mouth daily. 08/04/17  Yes [provider]  aspirin EC 81 MG tablet Take 81 mg by mouth every evening.    Yes [provider]  Carboxymethylcellul-Glycerin (LUBRICATING EYE DROPS OP) Place 1 drop into both eyes daily as needed (dry eyes).   Yes [provider]  cetirizine (ZYRTEC) 10 MG tablet Take 10 mg by mouth daily.   Yes [provider]  clopidogrel (PLAVIX) 75 MG tablet TAKE 1 TABLET BY MOUTH ONCE DAILY AFTER OFF  PRADAXA 09/30/17  Yes [provider]  finasteride (PROSCAR) 5 MG tablet TAKE ONE TABLET BY MOUTH ONCE DAILY 02/24/17  Yes McGowan, Larene [place] A, PA-C  ibuprofen (ADVIL,MOTRIN) 200 MG tablet Take 200 mg by mouth daily as needed for headache or moderate pain.   Yes [provider]  lovastatin (MEVACOR) 20 MG tablet Take 40 mgs by mouth once daily in the evening 04/16/15  Yes [provider]  meloxicam (MOBIC) 7.5 MG tablet Take 1 tablet (7.5 mg total) by mouth daily. 09/29/17 09/29/18 Yes Cuthriell, Charline Bills, PA-C  methocarbamol (ROBAXIN) 500 MG tablet Take 1 tablet (500 mg total) by mouth every 8 (eight) hours as needed for muscle spasms. 09/29/17  Yes Cuthriell, Charline Bills, PA-C  Multiple Vitamin (MULTI-VITAMINS) TABS Take 0.5 tablets by mouth 2 (two) times daily.    Yes [provider]  tamsulosin (FLOMAX) 0.4 MG CAPS capsule Take 1 capsule (0.4 mg total) by mouth daily. 02/19/17  Yes [name], MD  methylPREDNISolone (MEDROL DOSEPAK) 4 MG TBPK tablet Take per package instructions 10/01/17   Lorin Picket, PA-C  orphenadrine (NORFLEX) 100 MG tablet Take 1 tablet (100 mg total) by mouth 2 (two) times daily. 10/01/17   Lorin Picket, PA-C    Family History Family History  Problem Relation Age of Onset  . Nephrotic syndrome Son   . CAD Mother   . Diabetes Mother   . Throat cancer Father   . Prostate cancer Neg Hx   . Kidney cancer Neg Hx   . Bladder Cancer Neg Hx     Social History Social History   Tobacco Use  . Smoking status: Never Smoker  . Smokeless tobacco: Former Systems developer    Types: Chew  Substance Use Topics  . Alcohol use: Yes    Alcohol/week: 1.0 standard drinks    Types: 1 Cans of beer per week    Comment: occasional  . Drug use: No     Allergies   Patient has no known allergies.   Review of Systems Review of Systems  Constitutional: Positive for activity change. Negative for appetite change, chills, fatigue and fever.  Musculoskeletal: Positive for back pain and myalgias.  All other systems reviewed and are negative.    Physical Exam Triage Vital Signs ED Triage Vitals  Enc Vitals Group     BP 10/01/17 1426 (!) 146/101     Pulse Rate 10/01/17 1426 (!) 57     Resp 10/01/17 1426 18     Temp 10/01/17 1426 98 F (36.7 C)     Temp Source 10/01/17 1426 Oral     SpO2 10/01/17 1426 98 %     Weight 10/01/17 1423 215 lb (97.5 kg)     Height 10/01/17 1423 5\' 10"  (1.778 m)     Head Circumference --      Peak Flow --      Pain Score 10/01/17 1422 7     Pain Loc --      Pain Edu? --      Excl. in [place]? --    No data found.  Updated Vital Signs BP (!) 146/101 (BP Location: Right Arm)   Pulse (!) 57   Temp 98 F (36.7 C) (Oral)   Resp 18   Ht 5\' 10"  (1.778 m)   Wt 215 lb (97.5 kg)   SpO2 98%   BMI 30.85 kg/m     Visual Acuity Right Eye Distance:   Left Eye Distance:   Bilateral  Distance:    Right Eye Near:   Left Eye Near:    Bilateral Near:     Physical Exam  Constitutional: He is oriented to person, place, and time. He appears well-developed and well-nourished. No distress.  HENT:  Head: Normocephalic.  Eyes: Pupils are equal, round, and reactive to light. Right eye exhibits no discharge. Left eye exhibits no discharge.  Neck: Normal range of motion.  Musculoskeletal: He exhibits tenderness.  Examination of the lumbar spine shows a level pelvis in stance.  Is flattening of the lordotic curve.  Is able to forward flex with his hands to the level of his ankles.  Returning to upright posture is difficult with low back pain.  Most of the tenderness is over the right sacroiliac joint and lower paraspinous muscles so present on the left.  Mild right sciatic notch tenderness.  She is able to toe and heel walk normally.  Sensation is intact to light touch to the lower extremities.  DTRs are hyperreflexic at 3+/4 but symmetrical.  Chelle peroneal and posterior tibialis muscles are strong to clinical testing.  Right leg raise testing in the seated position is positive at 90 degrees bilaterally with posterior thigh numbness and tingling.  Neurological: He is alert and oriented to person, place, and time.  Skin: Skin is warm and dry. He is not diaphoretic.  Psychiatric: He has a normal mood and affect. His behavior is normal. Judgment and thought content normal.  Nursing note and vitals reviewed.    UC Treatments / Results  Labs (all labs ordered are listed, but only abnormal results are displayed) Labs Reviewed - No data to display  EKG None  Radiology Dg Lumbar Spine Complete  Result Date: 10/01/2017 CLINICAL DATA:  Low back pain. EXAM: LUMBAR SPINE - COMPLETE 4+ VIEW COMPARISON:  None. FINDINGS: There is no evidence of lumbar spine fracture. Alignment is normal. Intervertebral disc spaces are  maintained. IMPRESSION: Negative. Electronically Signed   By: Misty Stanley M.D.   On: 10/01/2017 15:39    Procedures Procedures (including critical care time)  Medications Ordered in UC Medications  ketorolac (TORADOL) injection 30 mg (30 mg Intramuscular Given 10/01/17 1516)    Initial Impression / Assessment and Plan / UC Course  I have reviewed the triage vital signs and the nursing notes.  Pertinent labs & imaging results that were available during my care of the patient were reviewed by me and considered in my medical decision making (see chart for details).     Reviewed physical findings as well as x-ray findings with the patient.  Reviewing of his medications I advised him not to continue taking the Mobic at the present time due to the interaction of the Plavix aspirin and anti-inflammatory drug and increasing risk of bleeding.  I have told him that the most important thing is symptom avoidance and avoiding  sitting lifting or bending.  Use ice on his low back and alternate with heat later on.  I will prescribe a Medrol Dosepak but asked him to clarify this with his cardiologist.  He seemed to have very good results with the Norflex at his emergency room visit and not so much with the Robaxin.  Therefore I will switch him from Robaxin to the Norflex.  He did receive 30 mg of Toradol today and had excellent relief with his symptoms decreasing from a 6 out of 10 to 0 out of 10.  He should follow-up with Dr. Kathalene Frames his primary care physician next week.  Final Clinical Impressions(s) / UC Diagnoses   Final diagnoses:  Strain of lumbar region, initial encounter     Discharge Instructions     Do not take Mobic or Robaxin.  Switch to Norflex for muscle spasm and Medrol Dosepak for anti-inflammatory effect.  Please check with your cardiologist first. Apply ice 20 minutes out of every 2 hours 4-5 times daily for comfort. Use  Caution while taking muscle relaxers.  Do not perform activities  requiring concentration or judgment and do not drive.  Avoid sitting lifting or bending    ED Prescriptions    Medication Sig Dispense Auth. Provider   methylPREDNISolone (MEDROL DOSEPAK) 4 MG TBPK tablet Take per package instructions 21 tablet Lorin Picket, PA-C   orphenadrine (NORFLEX) 100 MG tablet Take 1 tablet (100 mg total) by mouth 2 (two) times daily. 30 tablet Lorin Picket, PA-C     Controlled Substance Prescriptions [place] Controlled Substance Registry consulted? Not Applicable   Lorin Picket, PA-C 10/01/17 5681

## 2017-10-01 NOTE — Discharge Instructions (Signed)
Do not take Mobic or Robaxin.  Switch to Norflex for muscle spasm and Medrol Dosepak for anti-inflammatory effect.  Please check with your cardiologist first. Apply ice 20 minutes out of every 2 hours 4-5 times daily for comfort. Use  Caution while taking muscle relaxers.  Do not perform activities requiring concentration or judgment and do not drive.  Avoid sitting lifting or bending

## 2017-10-01 NOTE — ED Triage Notes (Signed)
Patient complains of low back pain that is more on his right side. Patient states that he was seen in ED 2 nights ago and was given Decadron and Norflex injection and improved. Patient states that he was sent home on Mobic and Robaxin but pain has returned with no relief from medications.

## 2017-11-04 ENCOUNTER — Ambulatory Visit (INDEPENDENT_AMBULATORY_CARE_PROVIDER_SITE_OTHER): Payer: Medicare Other | Admitting: Urology

## 2017-11-04 ENCOUNTER — Encounter: Payer: Self-pay | Admitting: Urology

## 2017-11-04 ENCOUNTER — Other Ambulatory Visit: Payer: Self-pay

## 2017-11-04 VITALS — BP 119/76 | HR 59 | Ht 70.0 in | Wt 213.3 lb

## 2017-11-04 DIAGNOSIS — N401 Enlarged prostate with lower urinary tract symptoms: Secondary | ICD-10-CM

## 2017-11-04 DIAGNOSIS — N138 Other obstructive and reflux uropathy: Secondary | ICD-10-CM

## 2017-11-04 DIAGNOSIS — Z8551 Personal history of malignant neoplasm of bladder: Secondary | ICD-10-CM

## 2017-11-04 DIAGNOSIS — N200 Calculus of kidney: Secondary | ICD-10-CM

## 2017-11-04 LAB — URINALYSIS, COMPLETE
BILIRUBIN UA: NEGATIVE
Glucose, UA: NEGATIVE
KETONES UA: NEGATIVE
LEUKOCYTES UA: NEGATIVE
Nitrite, UA: NEGATIVE
PROTEIN UA: NEGATIVE
SPEC GRAV UA: 1.015 (ref 1.005–1.030)
Urobilinogen, Ur: 1 mg/dL (ref 0.2–1.0)
pH, UA: 7 (ref 5.0–7.5)

## 2017-11-04 LAB — MICROSCOPIC EXAMINATION
Bacteria, UA: NONE SEEN
Epithelial Cells (non renal): NONE SEEN /hpf (ref 0–10)

## 2017-11-04 NOTE — Progress Notes (Signed)
   11/04/17  CC:  Chief Complaint  Patient presents with  . 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  He returns today for surveillance cystoscopy.  CT urogram which showed a 3 mm left lower pole stone on 12/18/15, enlarged prostate with diffuse bladder wall thickening.  He underwent TURBT on 01/02/2016 which showed a 2 mm papillary lesion just adjacent to the right UO. This was consistent with low-grade TA TCC.  He does also have personal history of BPH on Flomax and finasteride with well-controlled urinary symptoms.  PSA rectal exam completed 04/2017.   Blood pressure (!) 155/81, pulse 65, height 5\' 10"  (1.778 m), weight 199 lb (90.3 kg). NED. A&Ox3.   No respiratory distress   Abd soft, NT, ND Normal phallus with bilateral descended testicles Rectal: Normal sphincter tone.  Enlarged 50 cc prostate, nontender, no nodules.  Cystoscopy Procedure Note  Patient identification was confirmed, informed consent was obtained, and patient was prepped using Betadine solution.  Lidocaine jelly was administered per urethral meatus.    Preoperative abx where received prior to procedure.     Pre-Procedure: - Inspection reveals a normal caliber ureteral meatus.  Procedure: The flexible cystoscope was introduced without difficulty - No urethral strictures/lesions are present. - Enlarged prostate lobar coaptation, 5 cm prostatic length - Elevated bladder neck - Bilateral ureteral orifices identified - Bladder mucosa  reveals no additional tumors, ulcerations, or lesions other than above - No bladder stones - Mild trabeculation  Retroflexion shows slight intravesical protrusion of the prostate.  Post-Procedure: - Patient tolerated the procedure well  Assessment/ Plan:  1.  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 BPH with obstruction/lower urinary tract symptoms Continue Flomax and finasteride - doing well PSA / rectal up to date, will repeat next year  3. Left nephrolithiasis 3 mm LLP stone, asymptomatic No intervention recommended, will follow clinically  Return in about 1 year (around 11/05/2018) for cysto/ DRE/ PSA.   [name], MD

## 2018-04-26 ENCOUNTER — Other Ambulatory Visit: Payer: Self-pay | Admitting: Urology

## 2018-10-29 ENCOUNTER — Other Ambulatory Visit: Payer: Self-pay

## 2018-10-29 DIAGNOSIS — N400 Enlarged prostate without lower urinary tract symptoms: Secondary | ICD-10-CM

## 2018-11-01 ENCOUNTER — Other Ambulatory Visit: Payer: Self-pay

## 2018-11-01 ENCOUNTER — Other Ambulatory Visit: Payer: Medicare Other

## 2018-11-01 DIAGNOSIS — N400 Enlarged prostate without lower urinary tract symptoms: Secondary | ICD-10-CM

## 2018-11-02 ENCOUNTER — Ambulatory Visit (INDEPENDENT_AMBULATORY_CARE_PROVIDER_SITE_OTHER): Payer: Medicare Other | Admitting: Urology

## 2018-11-02 ENCOUNTER — Encounter: Payer: Self-pay | Admitting: Urology

## 2018-11-02 VITALS — BP 157/48 | HR 60 | Ht 70.0 in | Wt 211.0 lb

## 2018-11-02 DIAGNOSIS — Z8551 Personal history of malignant neoplasm of bladder: Secondary | ICD-10-CM

## 2018-11-02 DIAGNOSIS — R3129 Other microscopic hematuria: Secondary | ICD-10-CM

## 2018-11-02 DIAGNOSIS — N400 Enlarged prostate without lower urinary tract symptoms: Secondary | ICD-10-CM

## 2018-11-02 LAB — PSA: Prostate Specific Ag, Serum: 0.7 ng/mL (ref 0.0–4.0)

## 2018-11-02 NOTE — Progress Notes (Signed)
   11/02/18  CC:  Chief Complaint  Patient presents with  . Cysto    HPI: 68 year old dx with LgTa TCC s/p microscopic hematuria workup.  He returns today for surveillance cystoscopy.  CT urogram which showed a 3 mm left lower pole stone on 12/18/15, enlarged prostate with diffuse bladder wall thickening.  He underwent TURBT on 01/02/2016 which showed a 2 mm papillary lesion just adjacent to the right UO. This was consistent with low-grade TA TCC.  Had no recurrences.  He does also have personal history of BPH on Flomax and finasteride with well-controlled urinary symptoms.  PSA rectal exam completed 04/2017.   Blood pressure (!) 155/81, pulse 65, height 5\' 10"  (1.778 m), weight 199 lb (90.3 kg). NED. A&Ox3.   No respiratory distress   Abd soft, NT, ND Normal phallus with bilateral descended testicles Rectal: Normal sphincter tone.  Enlarged 50 cc prostate, nontender, no nodules, unchanged  Cystoscopy Procedure Note  Patient identification was confirmed, informed consent was obtained, and patient was prepped using Betadine solution.  Lidocaine jelly was administered per urethral meatus.    Preoperative abx where received prior to procedure.     Pre-Procedure: - Inspection reveals a normal caliber ureteral meatus.  Procedure: The flexible cystoscope was introduced without difficulty - No urethral strictures/lesions are present. - Enlarged prostate lobar coaptation, 5 cm prostatic length - Elevated bladder neck - Bilateral ureteral orifices identified - Bladder mucosa  reveals no additional tumors, ulcerations, or lesions other than above - No bladder stones - Mild trabeculation  Retroflexion shows slight intravesical protrusion of the prostate and small amount of punctate stonelike debris  Post-Procedure: - Patient tolerated the procedure well  Assessment/ Plan:  1.  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 BPH with obstruction/lower urinary tract symptoms Continue Flomax and finasteride - doing well PSA / rectal up to date, will repeat next year  3. Left nephrolithiasis 3 mm LLP stone, asymptomatic No intervention recommended, will follow clinically  4. Microscopic hematuria Incidental microscopic blood, most recent imaging now 3 years ago Recommended renal ultrasound for completeness, he is agreeable this plan-we will call with results - US RENAL; Future   Return in about 1 year (around 11/02/2019) for cysto/ PSA / DRE.   [name], MD

## 2018-11-03 LAB — URINALYSIS, COMPLETE
Bilirubin, UA: NEGATIVE
Glucose, UA: NEGATIVE
Ketones, UA: NEGATIVE
Leukocytes,UA: NEGATIVE
Nitrite, UA: NEGATIVE
Protein,UA: NEGATIVE
Specific Gravity, UA: 1.025 (ref 1.005–1.030)
Urobilinogen, Ur: 1 mg/dL (ref 0.2–1.0)
pH, UA: 7 (ref 5.0–7.5)

## 2018-11-03 LAB — MICROSCOPIC EXAMINATION: Bacteria, UA: NONE SEEN

## 2018-11-08 ENCOUNTER — Ambulatory Visit
Admission: RE | Admit: 2018-11-08 | Discharge: 2018-11-08 | Disposition: A | Discharge status: Home / Self Care | Payer: Medicare Other | Source: Ambulatory Visit | Attending: Urology | Admitting: Urology

## 2018-11-08 ENCOUNTER — Other Ambulatory Visit: Payer: Self-pay

## 2018-11-08 DIAGNOSIS — R3129 Other microscopic hematuria: Secondary | ICD-10-CM | POA: Diagnosis not present

## 2018-11-09 ENCOUNTER — Telehealth: Payer: Self-pay | Admitting: *Deleted

## 2018-11-09 NOTE — Telephone Encounter (Addendum)
Left VM-asked to return call with any questions.   ----- Message from [name], MD sent at 11/09/2018  8:06 AM EDT ----- Renal ultrasound reviewed, no suspicious lesions.  Great news.  [name], MD

## 2018-12-21 ENCOUNTER — Other Ambulatory Visit: Payer: Self-pay | Admitting: Urology

## 2019-02-07 ENCOUNTER — Encounter: Admission: RE | Payer: Self-pay | Source: Home / Self Care

## 2019-02-07 ENCOUNTER — Ambulatory Visit: Admission: RE | Admit: 2019-02-07 | Payer: Medicare Other | Source: Home / Self Care | Admitting: Internal Medicine

## 2019-02-07 SURGERY — COLONOSCOPY WITH PROPOFOL
Anesthesia: General

## 2019-03-21 ENCOUNTER — Other Ambulatory Visit: Payer: Self-pay | Admitting: Urology

## 2019-06-27 ENCOUNTER — Other Ambulatory Visit: Payer: Self-pay | Admitting: Urology

## 2019-08-03 ENCOUNTER — Other Ambulatory Visit: Payer: Self-pay

## 2019-08-03 ENCOUNTER — Other Ambulatory Visit
Admission: RE | Admit: 2019-08-03 | Discharge: 2019-08-03 | Disposition: A | Discharge status: Home / Self Care | Payer: Medicare Other | Source: Ambulatory Visit | Attending: Gastroenterology | Admitting: Gastroenterology

## 2019-08-03 DIAGNOSIS — Z20822 Contact with and (suspected) exposure to covid-19: Secondary | ICD-10-CM | POA: Insufficient documentation

## 2019-08-03 DIAGNOSIS — Z01812 Encounter for preprocedural laboratory examination: Secondary | ICD-10-CM | POA: Diagnosis present

## 2019-08-03 LAB — SARS CORONAVIRUS 2 (TAT 6-24 HRS): SARS Coronavirus 2: NEGATIVE

## 2019-08-05 ENCOUNTER — Ambulatory Visit: Payer: Medicare Other | Admitting: Certified Registered"

## 2019-08-05 ENCOUNTER — Ambulatory Visit
Admission: RE | Admit: 2019-08-05 | Discharge: 2019-08-05 | Disposition: A | Discharge status: Home / Self Care | Payer: Medicare Other | Attending: Gastroenterology | Admitting: Gastroenterology

## 2019-08-05 ENCOUNTER — Encounter
Admission: RE | Disposition: A | Discharge status: Home / Self Care | Payer: Self-pay | Source: Home / Self Care | Attending: Gastroenterology

## 2019-08-05 ENCOUNTER — Encounter: Payer: Self-pay | Admitting: *Deleted

## 2019-08-05 ENCOUNTER — Other Ambulatory Visit: Payer: Self-pay

## 2019-08-05 DIAGNOSIS — I4891 Unspecified atrial fibrillation: Secondary | ICD-10-CM | POA: Insufficient documentation

## 2019-08-05 DIAGNOSIS — K5730 Diverticulosis of large intestine without perforation or abscess without bleeding: Secondary | ICD-10-CM | POA: Insufficient documentation

## 2019-08-05 DIAGNOSIS — Z1211 Encounter for screening for malignant neoplasm of colon: Secondary | ICD-10-CM | POA: Diagnosis not present

## 2019-08-05 DIAGNOSIS — D122 Benign neoplasm of ascending colon: Secondary | ICD-10-CM | POA: Insufficient documentation

## 2019-08-05 DIAGNOSIS — G4730 Sleep apnea, unspecified: Secondary | ICD-10-CM | POA: Diagnosis not present

## 2019-08-05 DIAGNOSIS — K648 Other hemorrhoids: Secondary | ICD-10-CM | POA: Insufficient documentation

## 2019-08-05 DIAGNOSIS — N400 Enlarged prostate without lower urinary tract symptoms: Secondary | ICD-10-CM | POA: Insufficient documentation

## 2019-08-05 DIAGNOSIS — E7800 Pure hypercholesterolemia, unspecified: Secondary | ICD-10-CM | POA: Insufficient documentation

## 2019-08-05 DIAGNOSIS — Z79899 Other long term (current) drug therapy: Secondary | ICD-10-CM | POA: Diagnosis not present

## 2019-08-05 DIAGNOSIS — I10 Essential (primary) hypertension: Secondary | ICD-10-CM | POA: Diagnosis not present

## 2019-08-05 DIAGNOSIS — Z7982 Long term (current) use of aspirin: Secondary | ICD-10-CM | POA: Insufficient documentation

## 2019-08-05 DIAGNOSIS — D125 Benign neoplasm of sigmoid colon: Secondary | ICD-10-CM | POA: Diagnosis not present

## 2019-08-05 DIAGNOSIS — I2510 Atherosclerotic heart disease of native coronary artery without angina pectoris: Secondary | ICD-10-CM | POA: Diagnosis not present

## 2019-08-05 DIAGNOSIS — Z8551 Personal history of malignant neoplasm of bladder: Secondary | ICD-10-CM | POA: Insufficient documentation

## 2019-08-05 HISTORY — DX: Pure hypercholesterolemia, unspecified: E78.00

## 2019-08-05 HISTORY — DX: Acute coronary thrombosis not resulting in myocardial infarction: I24.0

## 2019-08-05 HISTORY — DX: Benign prostatic hyperplasia without lower urinary tract symptoms: N40.0

## 2019-08-05 HISTORY — DX: Syncope and collapse: R55

## 2019-08-05 HISTORY — PX: COLONOSCOPY WITH PROPOFOL: SHX5780

## 2019-08-05 HISTORY — DX: Malignant (primary) neoplasm, unspecified: C80.1

## 2019-08-05 HISTORY — DX: Male erectile dysfunction, unspecified: N52.9

## 2019-08-05 HISTORY — DX: Personal history of urinary calculi: Z87.442

## 2019-08-05 HISTORY — DX: Tremor, unspecified: R25.1

## 2019-08-05 SURGERY — COLONOSCOPY WITH PROPOFOL
Anesthesia: General

## 2019-08-05 MED ORDER — PROPOFOL 500 MG/50ML IV EMUL
INTRAVENOUS | Status: AC
  Filled 2019-08-05: qty 50

## 2019-08-05 MED ORDER — SODIUM CHLORIDE 0.9 % IV SOLN: INTRAVENOUS | Status: DC

## 2019-08-05 MED ORDER — LIDOCAINE HCL (CARDIAC) PF 100 MG/5ML IV SOSY
PREFILLED_SYRINGE | INTRAVENOUS | Status: DC | PRN
  Administered 2019-08-05: 100 mg via INTRAVENOUS

## 2019-08-05 MED ORDER — PROPOFOL 10 MG/ML IV BOLUS
INTRAVENOUS | Status: AC
  Filled 2019-08-05: qty 40

## 2019-08-05 MED ORDER — PROPOFOL 500 MG/50ML IV EMUL
INTRAVENOUS | Status: DC | PRN
  Administered 2019-08-05: 110 ug/kg/min via INTRAVENOUS

## 2019-08-05 MED ORDER — LIDOCAINE HCL (PF) 2 % IJ SOLN
INTRAMUSCULAR | Status: AC
  Filled 2019-08-05: qty 5

## 2019-08-05 NOTE — Anesthesia Preprocedure Evaluation (Addendum)
Anesthesia Evaluation  Patient identified by MRN, date of birth, ID band Patient awake    Reviewed: Allergy & Precautions, NPO status , Patient's Chart, lab work & pertinent test results  History of Anesthesia Complications Negative for: history of anesthetic complications  Airway Mallampati: III  TM Distance: >3 FB Neck ROM: Full    Dental  (+) Poor Dentition, Dental Advidsory Given   Pulmonary neg shortness of breath, sleep apnea and Continuous Positive Airway Pressure Ventilation , neg COPD, neg recent URI,    breath sounds clear to auscultation- rhonchi (-) wheezing      Cardiovascular Exercise Tolerance: Good hypertension, Pt. on medications (-) angina+ CAD  (-) Past MI and (-) Cardiac Stents + dysrhythmias (s/p ablation) Atrial Fibrillation (-) Valvular Problems/Murmurs Rhythm:Regular Rate:Normal - Systolic murmurs and - Diastolic murmurs    Neuro/Psych negative neurological ROS  negative psych ROS   GI/Hepatic negative GI ROS, Neg liver ROS,   Endo/Other  negative endocrine ROSneg diabetes  Renal/GU negative Renal ROS     Musculoskeletal negative musculoskeletal ROS (+)   Abdominal (+) - obese,   Peds  Hematology negative hematology ROS (+)   Anesthesia Other Findings Past Medical History: No date: Atrial fibrillation (HCC) No date: Dysrhythmia     Comment: a fib No date: Heart disease No date: Hypertension No date: Sleep apnea No date: Testicle pain   Reproductive/Obstetrics                            Anesthesia Physical  Anesthesia Plan  ASA: III  Anesthesia Plan: General   Post-op Pain Management:    Induction: Intravenous  PONV Risk Score and Plan: Propofol infusion and TIVA  Airway Management Planned: Natural Airway and Nasal Cannula  Additional Equipment:   Intra-op Plan:   Post-operative Plan:   Informed Consent: I have reviewed the patients History and  Physical, chart, labs and discussed the procedure including the risks, benefits and alternatives for the proposed anesthesia with the patient or authorized representative who has indicated his/her understanding and acceptance.     Dental advisory given  Plan Discussed with: CRNA and Anesthesiologist  Anesthesia Plan Comments:         Anesthesia Quick Evaluation

## 2019-08-05 NOTE — Op Note (Signed)
[facility] Gastroenterology Patient Name: [name] Procedure Date: 08/05/2019 12:18 PM MRN: [id number] Account #: [id number] Date of Birth: [birth year] Admit Type: Outpatient Age: 69 Room: [facility] ENDO ROOM 3 Gender: Male Note Status: Finalized Procedure:             Colonoscopy Indications:           Screening for colorectal malignant neoplasm Providers:             Andrey Farmer MD, MD Referring MD:          Christena Flake. Raechel Ache, MD (Referring MD) Medicines:             Monitored Anesthesia Care Complications:         No immediate complications. Estimated blood loss:                         Minimal. Procedure:             Pre-Anesthesia Assessment:                        - Prior to the procedure, a History and Physical was                         performed, and patient medications and allergies were                         reviewed. The patient is competent. The risks and                         benefits of the procedure and the sedation options and                         risks were discussed with the patient. All questions                         were answered and informed consent was obtained.                         Patient identification and proposed procedure were                         verified by the physician, the nurse, the anesthetist                         and the technician in the endoscopy suite. Mental                         Status Examination: alert and oriented. Airway                         Examination: normal oropharyngeal airway and neck                         mobility. Respiratory Examination: clear to                         auscultation. CV Examination: normal. Prophylactic  Antibiotics: The patient does not require prophylactic                         antibiotics. Prior Anticoagulants: The patient has                         taken no previous anticoagulant or antiplatelet                         agents. ASA Grade  Assessment: II - A patient with mild                         systemic disease. After reviewing the risks and                         benefits, the patient was deemed in satisfactory                         condition to undergo the procedure. The anesthesia                         plan was to use monitored anesthesia care (MAC).                         Immediately prior to administration of medications,                         the patient was re-assessed for adequacy to receive                         sedatives. The heart rate, respiratory rate, oxygen                         saturations, blood pressure, adequacy of pulmonary                         ventilation, and response to care were monitored                         throughout the procedure. The physical status of the                         patient was re-assessed after the procedure.                        After obtaining informed consent, the colonoscope was                         passed under direct vision. Throughout the procedure,                         the patient's blood pressure, pulse, and oxygen                         saturations were monitored continuously. The                         Colonoscope was introduced through the anus and  advanced to the the cecum, identified by appendiceal                         orifice and ileocecal valve. The colonoscopy was                         technically difficult and complex due to inadequate                         bowel prep. Prep was improved to adequate with                         vigourous lavage and suction. Successful completion of                         the procedure was aided by lavage. The patient                         tolerated the procedure well. The quality of the bowel                         preparation was adequate to identify polyps. Findings:      The perianal and digital rectal examinations were normal.      Three sessile polyps were  found in the ascending colon. The polyps were       2 to 4 mm in size. These polyps were removed with a cold snare.       Resection and retrieval were complete. Estimated blood loss was minimal.      A 8 mm polyp was found in the sigmoid colon. The polyp was       semi-pedunculated. The polyp was removed with a hot snare. Resection and       retrieval were complete. Estimated blood loss was minimal.      Internal hemorrhoids were found during retroflexion. The hemorrhoids       were medium-sized.      A few small-mouthed diverticula were found in the sigmoid colon. Impression:            - Three 2 to 4 mm polyps in the ascending colon,                         removed with a cold snare. Resected and retrieved.                        - One 8 mm polyp in the sigmoid colon, removed with a                         hot snare. Resected and retrieved.                        - Internal hemorrhoids.                        - Diverticulosis in the sigmoid colon. Recommendation:        - Discharge patient to home.                        - Resume previous diet.                        -  Continue present medications.                        - Await pathology results.                        - Repeat colonoscopy in 3 years for surveillance.                        - Return to referring physician as previously                         scheduled. Procedure Code(s):     --- Professional ---                        [phone], Colonoscopy, flexible; with removal of                         tumor(s), polyp(s), or other lesion(s) by snare                         technique Diagnosis Code(s):     --- Professional ---                        K63.5, Polyp of colon                        Z12.11, Encounter for screening for malignant neoplasm                         of colon                        K64.8, Other hemorrhoids                        K57.30, Diverticulosis of large intestine without                          perforation or abscess without bleeding CPT copyright 2019 American Medical Association. All rights reserved. The codes documented in this report are preliminary and upon coder review may  be revised to meet current compliance requirements. Andrey Farmer, MD Andrey Farmer MD, MD 08/05/2019 1:57:52 PM Number of Addenda: 0 Note Initiated On: 08/05/2019 12:18 PM Scope Withdrawal Time: 0 hours 20 minutes 52 seconds  Total Procedure Duration: 0 hours 33 minutes 56 seconds  Estimated Blood Loss:  Estimated blood loss was minimal.      [facility]

## 2019-08-05 NOTE — Transfer of Care (Signed)
Immediate Anesthesia Transfer of Care Note  Patient: [place]  Procedure(s) Performed: COLONOSCOPY WITH PROPOFOL (N/A )  Patient Location: PACU and Endoscopy Unit  Anesthesia Type:General  Level of Consciousness: sedated  Airway & Oxygen Therapy: Patient Spontanous Breathing and Patient connected to face mask oxygen  Post-op Assessment: Report given to RN and Post -op Vital signs reviewed and stable  Post vital signs: Reviewed and stable  Last Vitals:  Vitals Value Taken Time  BP    Temp    Pulse 69 08/05/19 1355  Resp 13 08/05/19 1355  SpO2 98 % 08/05/19 1355  Vitals shown include unvalidated device data.  Last Pain:  Vitals:   08/05/19 1355  TempSrc: (P) Temporal  PainSc:          Complications: No complications documented.

## 2019-08-05 NOTE — Interval H&P Note (Signed)
History and Physical Interval Note:  08/05/2019 12:29 PM  [name]  has presented today for surgery, with the diagnosis of SCREENING.  The various methods of treatment have been discussed with the patient and family. After consideration of risks, benefits and other options for treatment, the patient has consented to  Procedure(s): COLONOSCOPY WITH PROPOFOL (N/A) as a surgical intervention.  The patient's history has been reviewed, patient examined, no change in status, stable for surgery.  I have reviewed the patient's chart and labs.  Questions were answered to the patient's satisfaction.     [name]  Ok to proceed with colonoscopy

## 2019-08-05 NOTE — H&P (Signed)
Outpatient short stay form Pre-procedure 08/05/2019 12:26 PM Raylene Miyamoto MD, MPH  Primary Physician: Dr. Raechel Ache  Reason for visit:  Screening colon  History of present illness:   69 y/o gentleman with history of hypertension here for colonoscopy. Had negative colonoscopy in 2010. No family history of GI malignancies, no abdominal surgeries, no blood thinners.    Current Facility-Administered Medications:  .  0.9 %  sodium chloride infusion, , Intravenous, Continuous, Sakeena Teall, Hilton Cork, MD  Medications Prior to Admission  Medication Sig Dispense Refill Last Dose  . amLODipine (NORVASC) 5 MG tablet Take 5 mg by mouth daily.  11 08/05/2019 at 0545  . aspirin EC 81 MG tablet Take 81 mg by mouth every evening.    08/04/2019 at Unknown time  . cetirizine (ZYRTEC) 10 MG tablet Take 10 mg by mouth daily.   08/04/2019 at Unknown time  . finasteride (PROSCAR) 5 MG tablet Take 1 tablet by mouth once daily 90 tablet 0 Past Week at Unknown time  . lovastatin (MEVACOR) 20 MG tablet Take 40 mgs by mouth once daily in the evening   Past Week at Unknown time  . Multiple Vitamin (MULTI-VITAMINS) TABS Take 0.5 tablets by mouth 2 (two) times daily.    Past Week at Unknown time  . tamsulosin (FLOMAX) 0.4 MG CAPS capsule Take 1 capsule by mouth once daily 90 capsule 0 Past Week at Unknown time  . Carboxymethylcellul-Glycerin (LUBRICATING EYE DROPS OP) Place 1 drop into both eyes daily as needed (dry eyes).     . methocarbamol (ROBAXIN) 500 MG tablet Take 1 tablet (500 mg total) by mouth every 8 (eight) hours as needed for muscle spasms. (Patient not taking: Reported on 08/05/2019) 16 tablet 0 Not Taking at Unknown time     No Known Allergies   Past Medical History:  Diagnosis Date  . Atrial fibrillation ([place])   . BPH (benign prostatic hyperplasia)   . CAD (coronary artery disease)    chronically occluded RCA  . Cancer (HCC)    TRANSITIONAL CELL BLADDER CANCER  . Dysrhythmia    a fib/flutter  .  ED (erectile dysfunction)   . History of kidney stones   . Hypercholesterolemia   . Hypertension   . RCA occlusion ([place])   . Sleep apnea   . Testicle pain   . Tremor   . Vasovagal syncope     Review of systems:  Otherwise negative.    Physical Exam  Gen: Alert, oriented. Appears stated age.  HEENT: [place]/AT. PERRLA. Lungs: no respiratory distress Abd: soft, benign, no masses. BS+ Ext: No edema. Pulses 2+    Planned procedures: Proceed with colonoscopy. The patient understands the nature of the planned procedure, indications, risks, alternatives and potential complications including but not limited to bleeding, infection, perforation, damage to internal organs and possible oversedation/side effects from anesthesia. The patient agrees and gives consent to proceed.  Please refer to procedure notes for findings, recommendations and patient disposition/instructions.     Raylene Miyamoto MD, MPH Gastroenterology 08/05/2019  12:26 PM

## 2019-08-08 ENCOUNTER — Encounter: Payer: Self-pay | Admitting: Gastroenterology

## 2019-08-09 LAB — SURGICAL PATHOLOGY

## 2019-08-09 NOTE — Anesthesia Postprocedure Evaluation (Signed)
Anesthesia Post Note  Patient: [name]  Procedure(s) Performed: COLONOSCOPY WITH PROPOFOL (N/A )  Patient location during evaluation: Endoscopy Anesthesia Type: General Level of consciousness: awake and alert Pain management: pain level controlled Vital Signs Assessment: post-procedure vital signs reviewed and stable Respiratory status: spontaneous breathing, nonlabored ventilation, respiratory function stable and patient connected to nasal cannula oxygen Cardiovascular status: blood pressure returned to baseline and stable Postop Assessment: no apparent nausea or vomiting Anesthetic complications: no   No complications documented.   Last Vitals:  Vitals:   08/05/19 1415 08/05/19 1425  BP: 118/73 (!) 130/76  Pulse: 56 55  Resp: 17 15  Temp:    SpO2: 97% 99%    Last Pain:  Vitals:   08/05/19 1425  TempSrc:   PainSc: 0-No pain                 Martha Clan

## 2019-09-26 ENCOUNTER — Other Ambulatory Visit: Payer: Self-pay | Admitting: Urology

## 2019-10-26 ENCOUNTER — Other Ambulatory Visit: Payer: Self-pay

## 2019-10-26 ENCOUNTER — Other Ambulatory Visit: Payer: Medicare Other

## 2019-10-26 DIAGNOSIS — N400 Enlarged prostate without lower urinary tract symptoms: Secondary | ICD-10-CM

## 2019-10-26 DIAGNOSIS — Z8551 Personal history of malignant neoplasm of bladder: Secondary | ICD-10-CM

## 2019-10-27 LAB — PSA: Prostate Specific Ag, Serum: 1 ng/mL (ref 0.0–4.0)

## 2019-11-01 NOTE — Progress Notes (Signed)
° °  11/02/2019  CC:  Chief Complaint  Patient presents with   [name]    NAT:FTDDU L Corro is a 69 y.o. male who returns today for surveillance cystoscopy.   CT urogram which showed a 3 mm left lower pole stone on 12/18/15, enlarged prostate with diffuse bladder wall thickening.  He underwent TURBT on 01/02/2016 which showed a 2 mm papillary lesion just adjacent to the right UO. This was consistent with low-grade TA TCC.  Had no recurrences.  Renal ultrasound on 11/08/2018 revealed bilateral renal cysts, measuring up to 2.0 cm, benign. No hydronephrosis.  He does also have personal history of BPH on Flomax and finasteride with well-controlled urinary symptoms.  Symptoms stable today.    PSA is 1.0 as of 10/26/2019.    PSA trend: Component     Latest Ref Rng & Units 05/05/2017 11/01/2018 10/26/2019  Prostate Specific Ag, Serum     0.0 - 4.0 ng/mL 0.5 0.7 1.0    Blood pressure 115/64, pulse (!) 56, height 5\' 10"  (1.778 m), weight 200 lb (90.7 kg). NED. A&Ox3.   No respiratory distress   Abd soft, NT, ND Normal phallus with bilateral descended testicles 50 g prostate, smooth no nodules   Cystoscopy Procedure Note  Patient identification was confirmed, informed consent was obtained, and patient was prepped using Betadine solution.  Lidocaine jelly was administered per urethral meatus.     Pre-Procedure: - Inspection reveals a normal caliber ureteral meatus.  Procedure: The flexible cystoscope was introduced without difficulty - No urethral strictures/lesions are present. - Enlarged prostate  - Elevated bladder neck - Bilateral ureteral orifices identified - Bladder mucosa  reveals no ulcers, tumors, or lesions - No bladder stones - Mild trabeculation  Retroflexion shows slight intravesical protrusion of the prostate without discrete median lobe  Post-Procedure: - Patient tolerated the procedure well  Assessment/ Plan:  1. 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 BPH with obstruction/LUTS Continue Flomax and finasteride  PSA screening up to date.   We discussed alternatives including TURP, PVP and holmium laser enucleation of the prostate for large glands.  Minimally invasive options of UroLift and water vapor ablation were discussed. Differences between the surgical procedures were discussed as well as the risks and benefits of each. All questions were answered. He was provided with information.  He will let us know if he is interested in perusing one of these options.  3. Left nephrolithiasis 3 mm LLP stone, Asymptomatic No intervention recommended Will follow clincally    I, Selena Batten, am acting as a scribe for Dr. [name].  I have reviewed the above documentation for accuracy and completeness, and I agree with the above.   [name], MD

## 2019-11-02 ENCOUNTER — Ambulatory Visit (INDEPENDENT_AMBULATORY_CARE_PROVIDER_SITE_OTHER): Payer: Medicare Other | Admitting: Urology

## 2019-11-02 ENCOUNTER — Encounter: Payer: Self-pay | Admitting: Urology

## 2019-11-02 ENCOUNTER — Other Ambulatory Visit: Payer: Self-pay

## 2019-11-02 VITALS — BP 115/64 | HR 56 | Ht 70.0 in | Wt 200.0 lb

## 2019-11-02 DIAGNOSIS — N138 Other obstructive and reflux uropathy: Secondary | ICD-10-CM

## 2019-11-02 DIAGNOSIS — N200 Calculus of kidney: Secondary | ICD-10-CM

## 2019-11-02 DIAGNOSIS — N401 Enlarged prostate with lower urinary tract symptoms: Secondary | ICD-10-CM | POA: Diagnosis not present

## 2019-11-02 DIAGNOSIS — Z8551 Personal history of malignant neoplasm of bladder: Secondary | ICD-10-CM

## 2019-11-03 LAB — URINALYSIS, COMPLETE
Bilirubin, UA: NEGATIVE
Glucose, UA: NEGATIVE
Ketones, UA: NEGATIVE
Leukocytes,UA: NEGATIVE
Nitrite, UA: NEGATIVE
Protein,UA: NEGATIVE
Specific Gravity, UA: >1.03 — ABNORMAL HIGH (ref 1.005–1.030)
Urobilinogen, Ur: 1 mg/dL (ref 0.2–1.0)
pH, UA: 6 (ref 5.0–7.5)

## 2019-11-03 LAB — MICROSCOPIC EXAMINATION
Bacteria, UA: NONE SEEN
RBC, Urine: >30 /hpf — AB (ref 0–2)

## 2019-11-14 ENCOUNTER — Other Ambulatory Visit: Payer: Self-pay

## 2019-11-14 ENCOUNTER — Ambulatory Visit
Admission: EM | Admit: 2019-11-14 | Discharge: 2019-11-14 | Disposition: A | Discharge status: Home / Self Care | Payer: Medicare Other | Attending: Family Medicine | Admitting: Family Medicine

## 2019-11-14 DIAGNOSIS — K5732 Diverticulitis of large intestine without perforation or abscess without bleeding: Secondary | ICD-10-CM

## 2019-11-14 MED ORDER — METRONIDAZOLE 500 MG PO TABS: 500.0000 mg | ORAL_TABLET | Freq: Three times a day (TID) | ORAL | 0 refills | Status: AC

## 2019-11-14 MED ORDER — CIPROFLOXACIN HCL 500 MG PO TABS: 500.0000 mg | ORAL_TABLET | Freq: Two times a day (BID) | ORAL | 0 refills | Status: DC

## 2019-11-14 NOTE — Discharge Instructions (Signed)
Rest.  Antibiotics as prescribed.  If you worsen, go directly to the ER.  Take care  Dr. Lacinda Axon

## 2019-11-14 NOTE — ED Triage Notes (Signed)
Patient presents to Urgent Care with complaints of left lower abdominal pain since the past couple days, worse in the mornings. Patient reports he has not had any burning or frequent urination, has not had a BM in 2 days.

## 2019-11-14 NOTE — ED Provider Notes (Signed)
[facility]    CSN: [id number] Arrival date & time: 11/14/19  0810  History   Chief Complaint Chief Complaint  Patient presents with  . Abdominal Pain    HPI  69 year old male presents with the above complaint.  2-day history of left lower quadrant abdominal pain. Achy in character. Mild to moderate in severity. Currently mild. Starts in early morning and seems to persist throughout the day but is somewhat variable. Has been worse today which has prompted his evaluation. Denies diarrhea. Denies urinary symptoms. No fever. No relieving factors. No other complaints or concerns at this time.  Past Medical History:  Diagnosis Date  . Atrial fibrillation ([place])   . BPH (benign prostatic hyperplasia)   . CAD (coronary artery disease)    chronically occluded RCA  . Cancer (HCC)    TRANSITIONAL CELL BLADDER CANCER  . Dysrhythmia    a fib/flutter  . ED (erectile dysfunction)   . History of kidney stones   . Hypercholesterolemia   . Hypertension   . RCA occlusion ([place])   . Sleep apnea   . Testicle pain   . Tremor   . Vasovagal syncope     Patient Active Problem List   Diagnosis Date Noted  . Syncope 12/30/2016  . Sleep apnea 11/08/2015  . S/P ablation of atrial flutter 11/30/2013  . A-fib ([place]) 11/19/2012  . CAD (coronary artery disease) 11/19/2012  . Atrial flutter ([place]) 11/18/2012  . RCA occlusion ([place]) 11/18/2012    Past Surgical History:  Procedure Laterality Date  . APPENDECTOMY    . CARDIAC CATHETERIZATION    . COLONOSCOPY    . COLONOSCOPY WITH PROPOFOL N/A 08/05/2019   Procedure: COLONOSCOPY WITH PROPOFOL;  Surgeon: [name], MD;  Location: [facility] ENDOSCOPY;  Service: Endoscopy;  Laterality: N/A;  . CYSTOSCOPY WITH BIOPSY N/A 01/02/2016   Procedure: CYSTOSCOPY WITH BIOPSY;  Surgeon: [name], MD;  Location: [facility] ORS;  Service: Urology;  Laterality: N/A;  . FACIAL RECONSTRUCTION SURGERY    . heart ablation    . HERNIA REPAIR    .  LOOP RECORDER INSERTION N/A 01/14/2017   Procedure: LOOP RECORDER INSERTION;  Surgeon: [name], MD;  Location: [facility];  Service: Cardiovascular;  Laterality: N/A;  . staples to head (posterior Right) Right 12/2016   fell and hit back of head   . TONSILLECTOMY    . VASECTOMY         Home Medications    Prior to Admission medications   Medication Sig Start Date End Date Taking? Authorizing Provider  amLODipine (NORVASC) 5 MG tablet Take 5 mg by mouth daily. 08/04/17   [provider]  aspirin EC 81 MG tablet Take 81 mg by mouth every evening.     [provider]  Carboxymethylcellul-Glycerin (LUBRICATING EYE DROPS OP) Place 1 drop into both eyes daily as needed (dry eyes).    [provider]  cetirizine (ZYRTEC) 10 MG tablet Take 10 mg by mouth daily.    [provider]  ciprofloxacin (CIPRO) 500 MG tablet Take 1 tablet (500 mg total) by mouth 2 (two) times daily. 11/14/19   Coral Spikes, DO  finasteride (PROSCAR) 5 MG tablet Take 1 tablet by mouth once daily 09/26/19   Ernestine Conrad, Larene Beach A, PA-C  lovastatin (MEVACOR) 20 MG tablet Take 40 mgs by mouth once daily in the evening 04/16/15   [provider]  metroNIDAZOLE (FLAGYL) 500 MG tablet Take 1 tablet (500 mg total) by  mouth 3 (three) times daily for 7 days. 11/14/19 11/21/19  Coral Spikes, DO  Multiple Vitamin (MULTI-VITAMINS) TABS Take 0.5 tablets by mouth 2 (two) times daily.     [provider]  tamsulosin (FLOMAX) 0.4 MG CAPS capsule Take 1 capsule by mouth once daily 09/26/19   Ernestine Conrad Hunt Oris, PA-C    Family History Family History  Problem Relation Age of Onset  . Nephrotic syndrome Son   . CAD Mother   . Diabetes Mother   . Throat cancer Father   . Prostate cancer Neg Hx   . Kidney cancer Neg Hx   . Bladder Cancer Neg Hx     Social History Social History   Tobacco Use  . Smoking status: Never Smoker  . Smokeless tobacco: Current User     Types: Chew  Vaping Use  . Vaping Use: Never used  Substance Use Topics  . Alcohol use: Yes    Alcohol/week: 1.0 standard drink    Types: 1 Cans of beer per week    Comment: occasional  . Drug use: No     Allergies   Patient has no known allergies.   Review of Systems Review of Systems  Constitutional: Negative for fever.  Gastrointestinal: Positive for abdominal pain.   Physical Exam Triage Vital Signs ED Triage Vitals  Enc Vitals Group     BP 11/14/19 0819 125/81     Pulse Rate 11/14/19 0819 67     Resp 11/14/19 0819 16     Temp 11/14/19 0819 98.5 F (36.9 C)     Temp Source 11/14/19 0819 Oral     SpO2 11/14/19 0819 99 %     Weight --      Height --      Head Circumference --      Peak Flow --      Pain Score 11/14/19 0818 2     Pain Loc --      Pain Edu? --      Excl. in [place]? --    Updated Vital Signs BP 125/81 (BP Location: Left Arm)   Pulse 67   Temp 98.5 F (36.9 C) (Oral)   Resp 16   SpO2 99%   Visual Acuity Right Eye Distance:   Left Eye Distance:   Bilateral Distance:    Right Eye Near:   Left Eye Near:    Bilateral Near:     Physical Exam Vitals and nursing note reviewed.  Constitutional:      General: He is not in acute distress.    Appearance: Normal appearance. He is not ill-appearing.  HENT:     Head: Normocephalic and atraumatic.  Eyes:     General:        Right eye: No discharge.        Left eye: No discharge.     Conjunctiva/sclera: Conjunctivae normal.  Cardiovascular:     Rate and Rhythm: Normal rate and regular rhythm.     Heart sounds: No murmur heard.   Pulmonary:     Effort: Pulmonary effort is normal.     Breath sounds: Normal breath sounds. No wheezing, rhonchi or rales.  Abdominal:     General: There is no distension.     Palpations: Abdomen is soft.     Comments: Mild tenderness in the LLQ.   Neurological:     Mental Status: He is alert.  Psychiatric:        Mood and Affect: Mood normal.  Behavior:  Behavior normal.    UC Treatments / Results  Labs (all labs ordered are listed, but only abnormal results are displayed) Labs Reviewed - No data to display  EKG   Radiology No results found.  Procedures Procedures (including critical care time)  Medications Ordered in UC Medications - No data to display  Initial Impression / Assessment and Plan / UC Course  I have reviewed the triage vital signs and the nursing notes.  Pertinent labs & imaging results that were available during my care of the patient were reviewed by me and considered in my medical decision making (see chart for details).    69 year old male presents with left lower quadrant pain. Patient has a known kidney stone in the left kidney. He had a recent colonoscopy which revealed diverticulosis. I suspect that the patient is experiencing diverticulitis. Do not suspect stone at this time. Treating with Cipro and Flagyl. Supportive care.  Final Clinical Impressions(s) / UC Diagnoses   Final diagnoses:  Diverticulitis of colon     Discharge Instructions     Rest.  Antibiotics as prescribed.  If you worsen, go directly to the ER.  Take care  Dr. Lacinda Axon    ED Prescriptions    Medication Sig Dispense Auth. Provider   ciprofloxacin (CIPRO) 500 MG tablet Take 1 tablet (500 mg total) by mouth 2 (two) times daily. 14 tablet Rai Sinagra G, DO   metroNIDAZOLE (FLAGYL) 500 MG tablet Take 1 tablet (500 mg total) by mouth 3 (three) times daily for 7 days. 21 tablet Thersa Salt G, DO     PDMP not reviewed this encounter.   Coral Spikes, [place] 11/14/19 [phone]

## 2019-11-30 IMAGING — US US CAROTID DUPLEX BILAT
1 series · 13 of 24 positions shown · non-contrast
Comparison: None.

CLINICAL DATA: Syncope, hypertension and hyperlipidemia.

EXAM:
BILATERAL CAROTID DUPLEX ULTRASOUND
TECHNIQUE: Gray scale imaging, color Doppler and duplex ultrasound were
performed of bilateral carotid and vertebral arteries in the neck.

[Series 1: us carotid duplex bilat · 0.08mm/px · 13 of 66 slices shown]
[im 1/66]
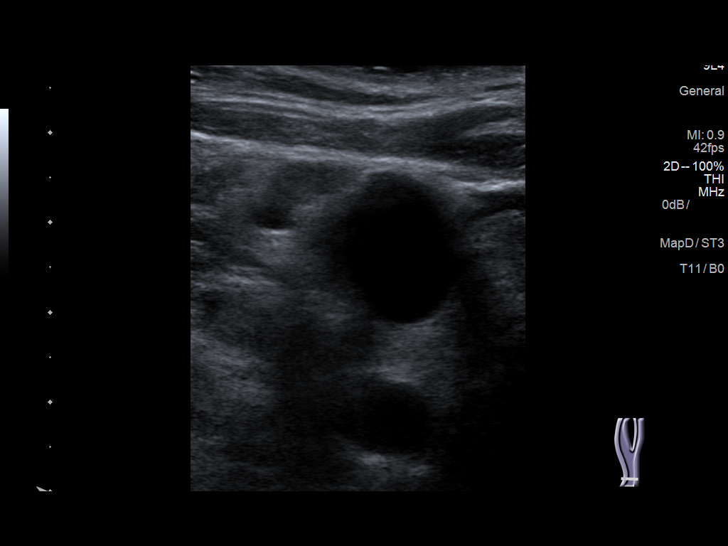
[im 6/66]
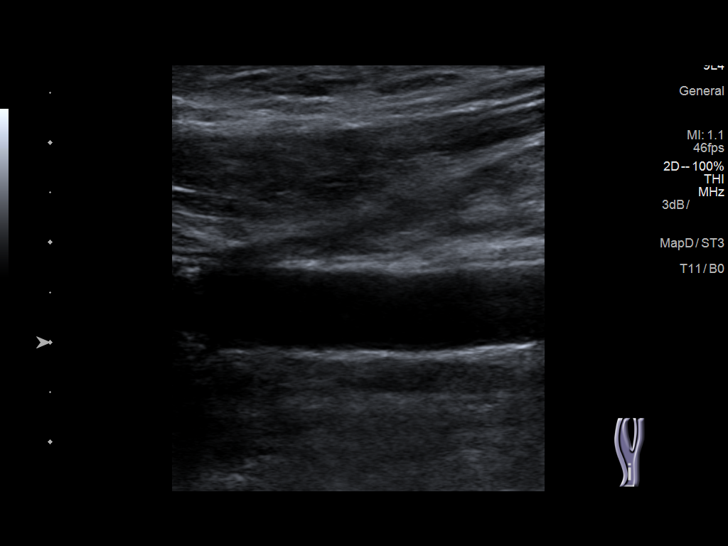
[im 12/66]
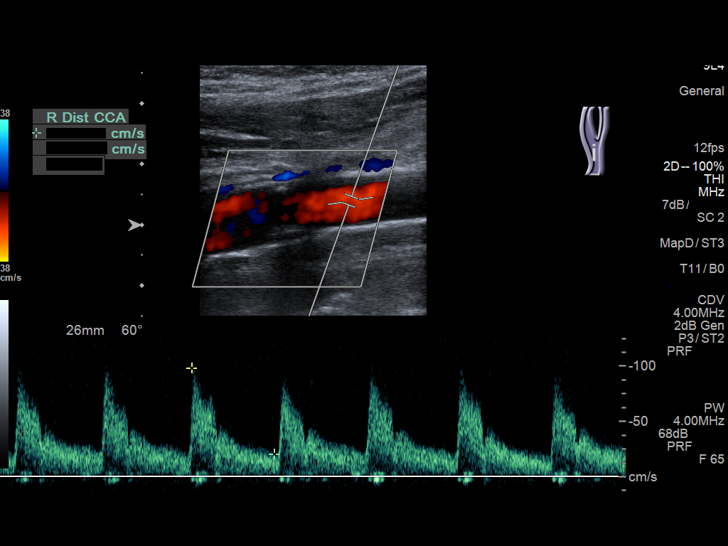
[im 17/66]
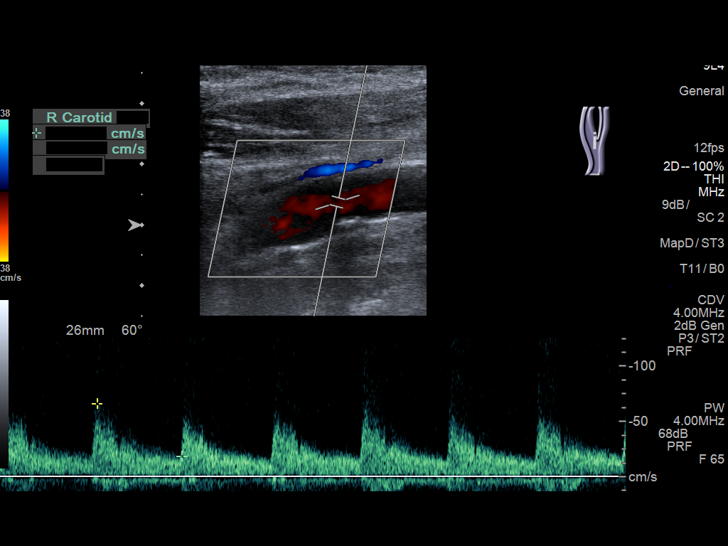
[im 23/66]
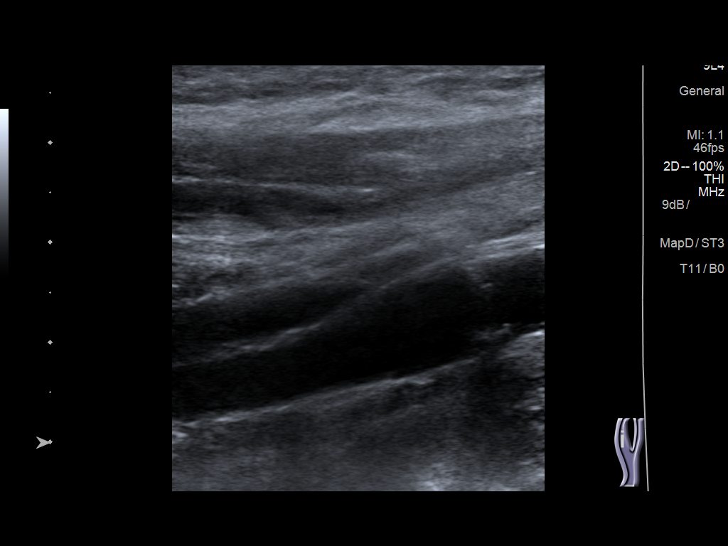
[im 29/66]
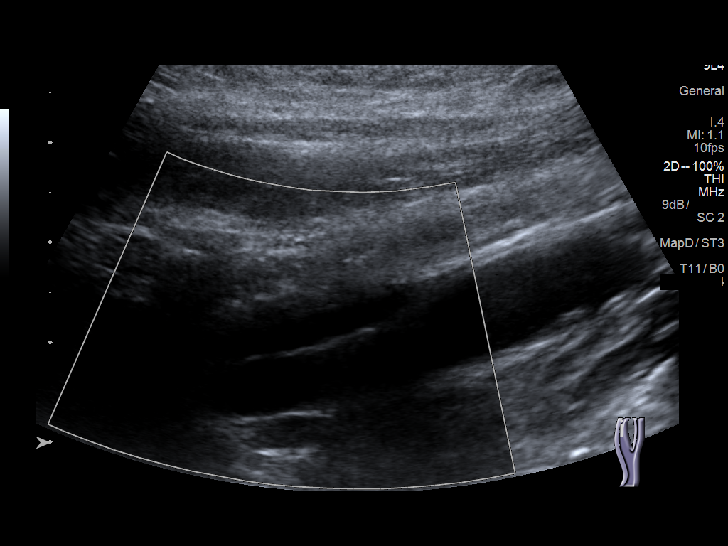
[im 34/66]
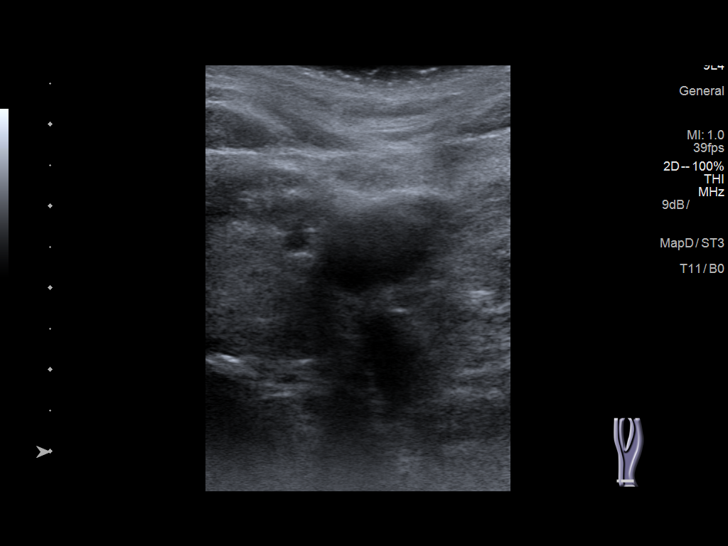
[im 37/66]
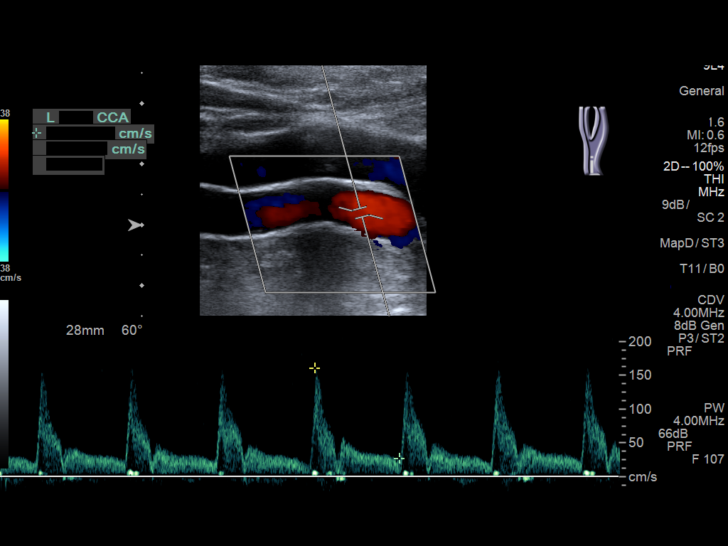
[im 43/66]
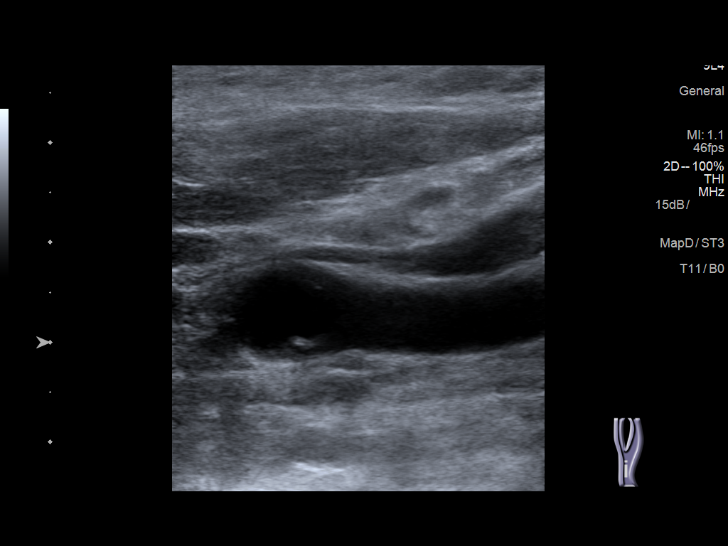
[im 49/66]
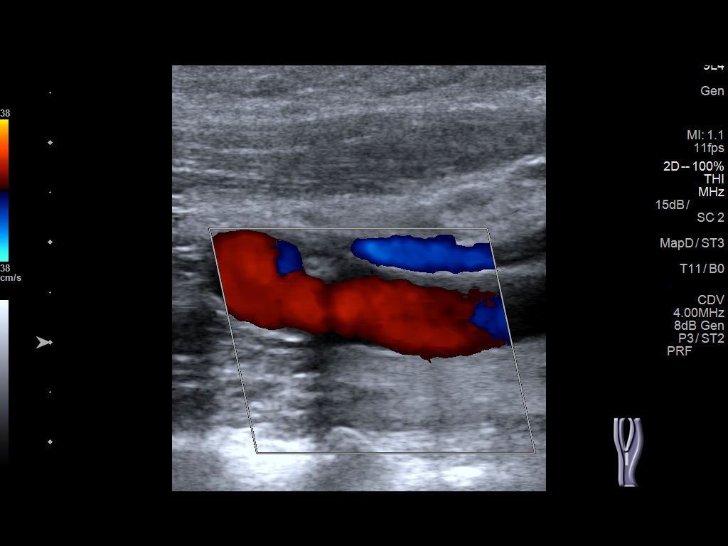
[im 54/66]
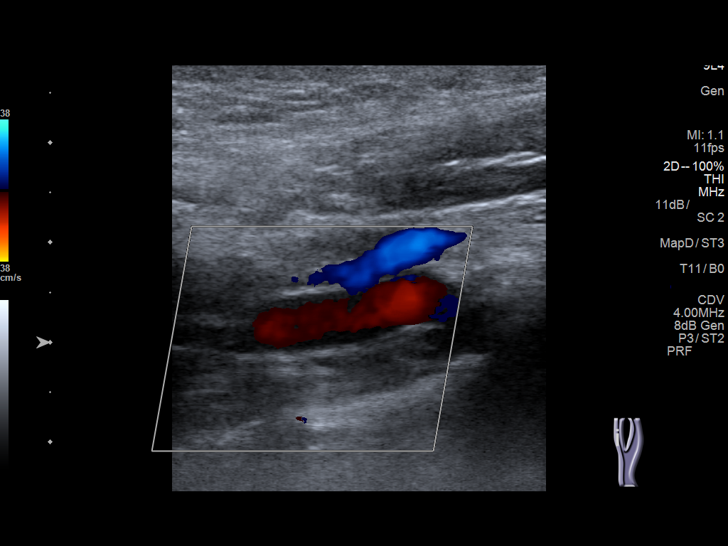
[im 60/66]
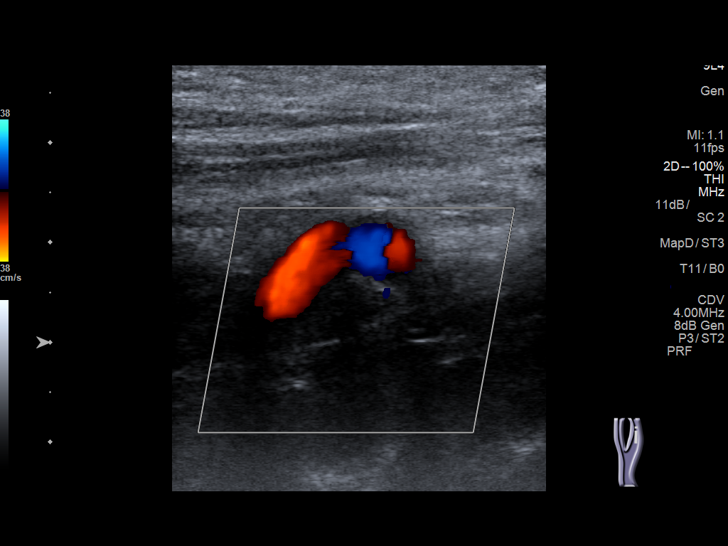
[im 66/66]
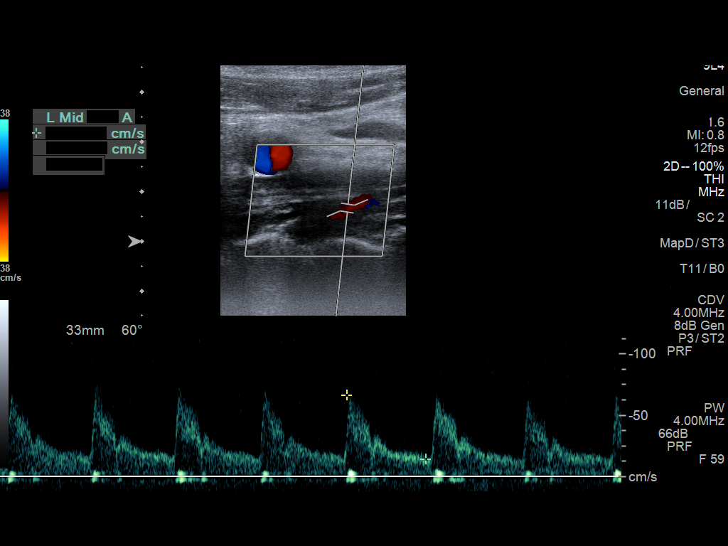

[13 of 24 positions shown; findings below may reference images not displayed]

FINDINGS: Criteria: Quantification of carotid stenosis is based on velocity
parameters that correlate the residual internal carotid diameter
with NASCET-based stenosis levels, using the diameter of the distal
internal carotid lumen as the denominator for stenosis measurement.

The following velocity measurements were obtained:

RIGHT

ICA:  98/20 cm/sec

CCA:  87/23 cm/sec

SYSTOLIC ICA/CCA RATIO:

DIASTOLIC ICA/CCA RATIO:

ECA:  178 cm/sec

LEFT

ICA:  117/25 cm/sec

CCA:  141/20 cm/sec

SYSTOLIC ICA/CCA RATIO:

DIASTOLIC ICA/CCA RATIO:

ECA:  117 cm/sec

RIGHT CAROTID ARTERY: Mild amount of calcified plaque at the level
of the carotid bulb and proximal right ICA. Velocities and waveforms
are normal and estimated right ICA stenosis is less than 50%.

RIGHT VERTEBRAL ARTERY: Antegrade flow with normal waveform and
velocity.

LEFT CAROTID ARTERY: Mild amount of plaque at the level of the
carotid bulb extending just up to the left ICA origin. Velocities
and waveforms are within normal limits. Estimated left ICA stenosis
is less than 50%.

LEFT VERTEBRAL ARTERY: Antegrade flow with normal waveform and
velocity.
IMPRESSION: Mild amount of plaque at the level of bilateral carotid bulbs and
ICA origins. Estimated bilateral ICA stenoses are less than 50%.

## 2019-12-28 ENCOUNTER — Other Ambulatory Visit: Payer: Self-pay | Admitting: Urology

## 2020-03-27 ENCOUNTER — Other Ambulatory Visit: Payer: Self-pay | Admitting: Urology

## 2020-04-02 ENCOUNTER — Other Ambulatory Visit: Payer: Self-pay

## 2020-04-02 MED ORDER — SILODOSIN 8 MG PO CAPS: 8.0000 mg | ORAL_CAPSULE | Freq: Every day | ORAL | 2 refills | Status: DC

## 2020-04-02 NOTE — Telephone Encounter (Signed)
Patient called stating that his insurance told him that Tamsulosin is no longer preferred and silodosin is now preferred lower tier mediation. He would like a script sent in. Ok to send in place of tamsulosin?

## 2020-04-03 ENCOUNTER — Telehealth: Payer: Self-pay

## 2020-04-03 ENCOUNTER — Other Ambulatory Visit: Payer: Self-pay | Admitting: Urology

## 2020-04-03 NOTE — Telephone Encounter (Signed)
Incoming call from pt stating that he went to the pharmacy today and was told that silodosin would also not be covered by insurance [employer] previously denied tamsulosin) Pt states that he called his insurance company today and they state we will need to complete a prior auth for either tamsulosin or sildosin. PA can be completed by calling [phone].

## 2020-04-04 NOTE — Telephone Encounter (Signed)
Are you going to do the PA for him?

## 2020-04-06 NOTE — Telephone Encounter (Signed)
No, the message regarding the PA was routed to Dr. Cherrie Gauze assigned [place] on 04/03/2020 at 1:58pm. I was on triage that day and received the message only.

## 2020-04-16 NOTE — Telephone Encounter (Signed)
Spoke with patient regarding medications-patient wants to wait on taking either medicines at the [facility] to be off all medications-was interested in making an appointment to discuss Urolift, offered scheduling appointment to discuss further-patient declined will call back, wants to research medications further and will call the office.

## 2020-10-26 ENCOUNTER — Other Ambulatory Visit: Payer: Self-pay

## 2020-10-26 ENCOUNTER — Other Ambulatory Visit: Payer: Medicare Other

## 2020-10-26 DIAGNOSIS — N138 Other obstructive and reflux uropathy: Secondary | ICD-10-CM

## 2020-10-27 LAB — PSA: Prostate Specific Ag, Serum: 1.3 ng/mL (ref 0.0–4.0)

## 2020-10-29 NOTE — Progress Notes (Signed)
   10/30/2020   CC:  Chief Complaint  Patient presents with   Cysto    HPI: [name] is a 70 y.o. 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  CT urogram which showed a 3 mm left lower pole stone on 12/18/15, enlarged prostate with diffuse bladder wall thickening.  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   RUS on 11/08/2018 revealed bilateral renal cysts that measured up to 2 cm.   Recent PSA on 10/26/2020 was 1.3.   Urinalysis today showed persistent microscopic hematuria.   He reports that he stopped taking finasteride and Flomax due to the cost. He has been off of them for 4-5 months.   PSA trend:  Component Prostate Specific Ag, Serum  Latest Ref Rng & Units 0.0 - 4.0 ng/mL  05/05/2017 0.5  11/01/2018 0.7  10/26/2019 1.0  10/26/2020 1.3    Vitals:   10/30/20 1019  BP: 127/82  Pulse: (!) 58  NED. A&Ox3.   No respiratory distress   Abd soft, NT, ND Normal phallus with bilateral descended testicles  Cystoscopy Procedure Note  Patient identification was confirmed, informed consent was obtained, and patient was prepped using Betadine solution.  Lidocaine jelly was administered per urethral meatus.     Pre-Procedure: - Inspection reveals a normal caliber ureteral meatus.  Procedure: The flexible cystoscope was introduced without difficulty - No urethral strictures/lesions are present. 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 Rising PSA  - PSA rising likely due to stopping finasteride   4.  Microscopic hematuria  - persistent  - cystoscopy today was reassuring  He has not had any recent upper tract upper tract imaging or. Would recommend a RUS.   Follow-up in a year for cystoscopy; We will call with renal ultrasound results  Conley Rolls as a scribe for [name], MD.,have documented all relevant documentation on the behalf of [name], MD,as directed by  [name], MD while in the presence of [name], MD.  I have reviewed the above documentation for accuracy and completeness, and I agree with the above.   [name], MD

## 2020-10-30 ENCOUNTER — Encounter: Payer: Self-pay | Admitting: Urology

## 2020-10-30 ENCOUNTER — Ambulatory Visit (INDEPENDENT_AMBULATORY_CARE_PROVIDER_SITE_OTHER): Payer: Medicare Other | Admitting: Urology

## 2020-10-30 ENCOUNTER — Other Ambulatory Visit: Payer: Self-pay

## 2020-10-30 VITALS — BP 127/82 | HR 58 | Ht 70.0 in | Wt 200.0 lb

## 2020-10-30 DIAGNOSIS — R3129 Other microscopic hematuria: Secondary | ICD-10-CM

## 2020-10-30 DIAGNOSIS — Z8551 Personal history of malignant neoplasm of bladder: Secondary | ICD-10-CM | POA: Diagnosis not present

## 2020-10-30 LAB — URINALYSIS, COMPLETE
Bilirubin, UA: NEGATIVE
Glucose, UA: NEGATIVE
Ketones, UA: NEGATIVE
Leukocytes,UA: NEGATIVE
Nitrite, UA: NEGATIVE
Specific Gravity, UA: 1.02 (ref 1.005–1.030)
Urobilinogen, Ur: 1 mg/dL (ref 0.2–1.0)
pH, UA: 7 (ref 5.0–7.5)

## 2020-10-30 LAB — MICROSCOPIC EXAMINATION

## 2020-10-30 MED ORDER — FINASTERIDE 5 MG PO TABS: 5.0000 mg | ORAL_TABLET | Freq: Every day | ORAL | 0 refills | Status: DC

## 2020-11-06 ENCOUNTER — Encounter: Payer: Self-pay | Admitting: Licensed Clinical Social Worker

## 2020-11-06 ENCOUNTER — Other Ambulatory Visit: Payer: Self-pay

## 2020-11-06 ENCOUNTER — Ambulatory Visit
Admission: EM | Admit: 2020-11-06 | Discharge: 2020-11-06 | Disposition: A | Discharge status: Home / Self Care | Payer: Medicare Other | Attending: Physician Assistant | Admitting: Physician Assistant

## 2020-11-06 DIAGNOSIS — R52 Pain, unspecified: Secondary | ICD-10-CM | POA: Insufficient documentation

## 2020-11-06 DIAGNOSIS — I4891 Unspecified atrial fibrillation: Secondary | ICD-10-CM | POA: Insufficient documentation

## 2020-11-06 DIAGNOSIS — E7800 Pure hypercholesterolemia, unspecified: Secondary | ICD-10-CM | POA: Insufficient documentation

## 2020-11-06 DIAGNOSIS — Z20822 Contact with and (suspected) exposure to covid-19: Secondary | ICD-10-CM | POA: Diagnosis not present

## 2020-11-06 DIAGNOSIS — Z79899 Other long term (current) drug therapy: Secondary | ICD-10-CM | POA: Diagnosis not present

## 2020-11-06 DIAGNOSIS — I2510 Atherosclerotic heart disease of native coronary artery without angina pectoris: Secondary | ICD-10-CM | POA: Insufficient documentation

## 2020-11-06 DIAGNOSIS — I10 Essential (primary) hypertension: Secondary | ICD-10-CM | POA: Insufficient documentation

## 2020-11-06 DIAGNOSIS — Z8551 Personal history of malignant neoplasm of bladder: Secondary | ICD-10-CM | POA: Diagnosis not present

## 2020-11-06 DIAGNOSIS — F17220 Nicotine dependence, chewing tobacco, uncomplicated: Secondary | ICD-10-CM | POA: Diagnosis not present

## 2020-11-06 DIAGNOSIS — R051 Acute cough: Secondary | ICD-10-CM | POA: Diagnosis not present

## 2020-11-06 DIAGNOSIS — B349 Viral infection, unspecified: Secondary | ICD-10-CM | POA: Diagnosis not present

## 2020-11-06 DIAGNOSIS — R5383 Other fatigue: Secondary | ICD-10-CM | POA: Insufficient documentation

## 2020-11-06 DIAGNOSIS — Z7982 Long term (current) use of aspirin: Secondary | ICD-10-CM | POA: Diagnosis not present

## 2020-11-06 LAB — SARS CORONAVIRUS 2 (TAT 6-24 HRS): SARS Coronavirus 2: NEGATIVE

## 2020-11-06 NOTE — ED Provider Notes (Signed)
[facility]    CSN: [id number] Arrival date & time: 11/06/20  0920      History   Chief Complaint Chief Complaint  Patient presents with   Chills   Weakness    HPI [name] is a 70 y.o. male presenting for 4-day history of fatigue and body aches.  Patient also admits to sweats last night.  He denies any recorded fevers.  Patient does report cough and congestion for the past couple of months likely due to his allergies.  He takes a daily antihistamine for that.  He says the cough and congestion seem to be a little worse yesterday.  Patient does also report getting his flu shot 2 days ago.  He says his symptoms were present before that.  He did take a COVID test at home which was negative but desires a PCR test.  No known COVID exposure.  Has been taking Coricidin HBP.  Has not taken Tylenol, Motrin or other antipyretic.  Past medical history significant for CAD, atrial fibrillation/atrial flutter, hypertension, hyperlipidemia and history of bladder cancer.  No other complaints.  HPI  Past Medical History:  Diagnosis Date   Atrial fibrillation (HCC)    BPH (benign prostatic hyperplasia)    CAD (coronary artery disease)    chronically occluded RCA   Cancer ([place])    TRANSITIONAL CELL BLADDER CANCER   Dysrhythmia    a fib/flutter   ED (erectile dysfunction)    History of kidney stones    Hypercholesterolemia    Hypertension    RCA occlusion ([place])    Sleep apnea    Testicle pain    Tremor    Vasovagal syncope     Patient Active Problem List   Diagnosis Date Noted   Syncope 12/30/2016   Sleep apnea 11/08/2015   S/P ablation of atrial flutter 11/30/2013   A-fib ([place]) 11/19/2012   CAD (coronary artery disease) 11/19/2012   Atrial flutter ([place]) 11/18/2012   RCA occlusion ([place]) 11/18/2012    Past Surgical History:  Procedure Laterality Date   APPENDECTOMY     CARDIAC CATHETERIZATION     COLONOSCOPY     COLONOSCOPY WITH PROPOFOL N/A 08/05/2019    Procedure: COLONOSCOPY WITH PROPOFOL;  Surgeon: [name], MD;  Location: [facility];  Service: Endoscopy;  Laterality: N/A;   CYSTOSCOPY WITH BIOPSY N/A 01/02/2016   Procedure: CYSTOSCOPY WITH BIOPSY;  Surgeon: [name], MD;  Location: [facility];  Service: Urology;  Laterality: N/A;   FACIAL RECONSTRUCTION SURGERY     heart ablation     HERNIA REPAIR     LOOP RECORDER INSERTION N/A 01/14/2017   Procedure: LOOP RECORDER INSERTION;  Surgeon: [name], MD;  Location: [place] [facility];  Service: Cardiovascular;  Laterality: N/A;   staples to head (posterior Right) Right 12/2016   fell and hit back of head    TONSILLECTOMY     VASECTOMY         Home Medications    Prior to Admission medications   Medication Sig Start Date End Date Taking? Authorizing Provider  amLODipine (NORVASC) 5 MG tablet Take 5 mg by mouth daily. 08/04/17  Yes [provider]  aspirin EC 81 MG tablet Take 81 mg by mouth every evening.    Yes [provider]  Carboxymethylcellul-Glycerin (LUBRICATING EYE DROPS OP) Place 1 drop into both eyes daily as needed (dry eyes).   Yes [provider]  cetirizine (ZYRTEC) 10 MG tablet Take 10 mg  by mouth daily.   Yes [provider]  finasteride (PROSCAR) 5 MG tablet Take 1 tablet (5 mg total) by mouth daily. 10/30/20  Yes [name], MD  lovastatin (MEVACOR) 20 MG tablet Take 40 mgs by mouth once daily in the evening 04/16/15  Yes [provider]  Multiple Vitamin (MULTI-VITAMINS) TABS Take 0.5 tablets by mouth 2 (two) times daily.    Yes [provider]    Family History Family History  Problem Relation Age of Onset   Nephrotic syndrome Son    CAD Mother    Diabetes Mother    Throat cancer Father    Prostate cancer Neg Hx    Kidney cancer Neg Hx    Bladder Cancer Neg Hx     Social History Social History   Tobacco Use   Smoking status: Never   Smokeless tobacco: Current     Types: Chew  Vaping Use   Vaping Use: Never used  Substance Use Topics   Alcohol use: Yes    Alcohol/week: 1.0 standard drink    Types: 1 Cans of beer per week    Comment: occasional   Drug use: No     Allergies   Patient has no known allergies.   Review of Systems Review of Systems  Constitutional:  Positive for appetite change, chills and fatigue. Negative for fever.  HENT:  Positive for congestion and rhinorrhea. Negative for sinus pressure, sinus pain and sore throat.   Respiratory:  Positive for cough. Negative for shortness of breath.   Gastrointestinal:  Negative for abdominal pain, diarrhea, nausea and vomiting.  Musculoskeletal:  Positive for myalgias.  Neurological:  Negative for weakness, light-headedness and headaches.  Hematological:  Negative for adenopathy.    Physical Exam Triage Vital Signs ED Triage Vitals  Enc Vitals Group     BP 11/06/20 1011 124/87     Pulse Rate 11/06/20 1011 74     Resp 11/06/20 1011 16     Temp 11/06/20 1011 98.6 F (37 C)     Temp Source 11/06/20 1011 Oral     SpO2 11/06/20 1011 97 %     Weight 11/06/20 1010 199 lb (90.3 kg)     Height 11/06/20 1010 5\' 9"  (1.753 m)     Head Circumference --      Peak Flow --      Pain Score 11/06/20 1009 4     Pain Loc --      Pain Edu? --      Excl. in [place]? --    No data found.  Updated Vital Signs BP 124/87 (BP Location: Left Arm)   Pulse 74   Temp 98.6 F (37 C) (Oral)   Resp 16   Ht 5\' 9"  (1.753 m)   Wt 199 lb (90.3 kg)   SpO2 97%   BMI 29.39 kg/m    Physical Exam Vitals and nursing note reviewed.  Constitutional:      General: He is not in acute distress.    Appearance: Normal appearance. He is well-developed. He is not ill-appearing or diaphoretic.  HENT:     Head: Normocephalic and atraumatic.     Right Ear: Tympanic membrane, ear canal and external ear normal.     Left Ear: Tympanic membrane, ear canal and external ear normal.     Nose: Congestion present.      Mouth/Throat:     Mouth: Mucous membranes are moist.     Pharynx: Oropharynx is clear. Uvula midline.  Tonsils: No tonsillar abscesses.  Eyes:     General: No scleral icterus.       Right eye: No discharge.        Left eye: No discharge.     Conjunctiva/sclera: Conjunctivae normal.  Neck:     Thyroid: No thyromegaly.     Trachea: No tracheal deviation.  Cardiovascular:     Rate and Rhythm: Normal rate and regular rhythm.     Heart sounds: Normal heart sounds.  Pulmonary:     Effort: Pulmonary effort is normal. No respiratory distress.     Breath sounds: Normal breath sounds. No wheezing, rhonchi or rales.  Musculoskeletal:     Cervical back: Normal range of motion and neck supple.  Lymphadenopathy:     Cervical: No cervical adenopathy.  Skin:    General: Skin is warm and dry.     Findings: No rash.  Neurological:     General: No focal deficit present.     Mental Status: He is alert. Mental status is at baseline.     Motor: No weakness.     Coordination: Coordination normal.     Gait: Gait normal.  Psychiatric:        Mood and Affect: Mood normal.        Behavior: Behavior normal.        Thought Content: Thought content normal.     UC Treatments / Results  Labs (all labs ordered are listed, but only abnormal results are displayed) Labs Reviewed  SARS CORONAVIRUS 2 (TAT 6-24 HRS)    EKG   Radiology No results found.  Procedures Procedures (including critical care time)  Medications Ordered in UC Medications - No data to display  Initial Impression / Assessment and Plan / UC Course  I have reviewed the triage vital signs and the nursing notes.  Pertinent labs & imaging results that were available during my care of the patient were reviewed by me and considered in my medical decision making (see chart for details).  70 year old male presenting for 4-day history of body aches, fatigue and worsening cough and congestion.  Of note, he did have his flu shot 2  days ago.  Negative home COVID test.  Today, vitals are all normal and stable and he is overall well-appearing.  Exam significant for only mild nasal congestion.  Chest clear to auscultation heart regular rate and rhythm.  Offered flu test but patient declines.  PCR COVID test obtained.  Current CDC guidelines, isolation protocol and ED precautions reviewed with patient.  Advised patient some of his symptoms likely due to allergies so continue the daily antihistamine.  Also I suspect some of him symptoms may be side effect from the flu shot but he could also have a viral illness.  Encouraged him to increase rest and fluids and continue to Coricidin HBP.  Advised patient we will call him with the COVID test result and can talk to him about antiviral medication if he is positive.  He would be on the fifth day so he would need to start it right away if he does want to take it.  Reviewed return and ED precautions.  Final Clinical Impressions(s) / UC Diagnoses   Final diagnoses:  Viral illness  Body aches  Other fatigue  Acute cough     Discharge Instructions      -The cough and congestion that you have had for a while are likely due to allergies so continue the daily antihistamine and start using Flonase. -  Your body aches and fatigue could be due to a viral illness and also potential side effects from your flu shot he had a couple days ago.  Tylenol for the body aches make sure you are resting and hydrating well. -The COVID test will be back tomorrow.  If you are positive you need to be isolated 5 days and wear a mask for 5 days.  We can discuss with you antiviral medication if you test positive since you would be within the 5 days.  The most important thing is to rest increase fluids and treat your symptoms.  If he were to have any uncontrollable fever, severe weakness, chest pain or breathing difficulty you should go to emergency department.     ED Prescriptions   None    PDMP not  reviewed this encounter.   Danton Clap, PA-C 11/06/20 1053

## 2020-11-06 NOTE — Discharge Instructions (Signed)
-  The cough and congestion that you have had for a while are likely due to allergies so continue the daily antihistamine and start using Flonase. -Your body aches and fatigue could be due to a viral illness and also potential side effects from your flu shot he had a couple days ago.  Tylenol for the body aches make sure you are resting and hydrating well. -The COVID test will be back tomorrow.  If you are positive you need to be isolated 5 days and wear a mask for 5 days.  We can discuss with you antiviral medication if you test positive since you would be within the 5 days.  The most important thing is to rest increase fluids and treat your symptoms.  If he were to have any uncontrollable fever, severe weakness, chest pain or breathing difficulty you should go to emergency department.

## 2020-11-06 NOTE — ED Triage Notes (Signed)
Pt c/o body aches, chills, x 4 days. Loss of appetite x 1 day. Covid test negative at home.

## 2020-11-07 ENCOUNTER — Encounter: Payer: Self-pay | Admitting: Urology

## 2020-11-07 ENCOUNTER — Telehealth: Payer: Self-pay | Admitting: *Deleted

## 2020-11-07 ENCOUNTER — Ambulatory Visit (INDEPENDENT_AMBULATORY_CARE_PROVIDER_SITE_OTHER): Payer: Medicare Other | Admitting: Urology

## 2020-11-07 VITALS — BP 145/84 | HR 66 | Temp 98.5°F | Ht 69.0 in | Wt 199.0 lb

## 2020-11-07 DIAGNOSIS — R300 Dysuria: Secondary | ICD-10-CM

## 2020-11-07 LAB — URINALYSIS, COMPLETE
Bilirubin, UA: NEGATIVE
Glucose, UA: NEGATIVE
Nitrite, UA: NEGATIVE
Specific Gravity, UA: 1.025 (ref 1.005–1.030)
Urobilinogen, Ur: 2 mg/dL — ABNORMAL HIGH (ref 0.2–1.0)
pH, UA: 6 (ref 5.0–7.5)

## 2020-11-07 LAB — MICROSCOPIC EXAMINATION: RBC, Urine: >30 /hpf — ABNORMAL HIGH (ref 0–2)

## 2020-11-07 LAB — BLADDER SCAN AMB NON-IMAGING: Scan Result: 16

## 2020-11-07 MED ORDER — SULFAMETHOXAZOLE-TRIMETHOPRIM 800-160 MG PO TABS: 1.0000 | ORAL_TABLET | Freq: Two times a day (BID) | ORAL | 0 refills | Status: DC

## 2020-11-07 NOTE — Progress Notes (Signed)
11/07/2020 1:17 PM   [name] [id number]  Referring provider: No referring provider defined for this encounter.  Chief Complaint  Patient presents with   Dysuria   Urological history: 1. 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 Renal cysts -RUS 10/2018 - Bilateral renal cysts, measuring up to 2.0 cm, benign. No hydronephrosis  3. High Risk Hematuria -non-smoker -CTU 2017 3 mm nonobstructive left renal calculus -cysto 2017 small solitary frond on trigone just right of midline concerning for a small papilloma versus superficial TCC, approximately 1 mm in size - see #1 -RUS pending for persistent micro heme -no reports of gross heme -UA > 30 RBC's - likely due to infection   4. BPH with LU TS -PSA 1.3 in 10/2020 -cysto 10/2020 -enlarged prostate - elevated bladder neck- mild trabeculation -discontinued tamsulosin and finasteride secondary to cost   HPI: [name] is a 70 y.o. male who presents today for chills and dysuria.  UA 11-30 WBCs, greater than 30 RBCs and few bacteria.  PVR 16 mL  He was seen yesterday at [facility] urgent care for 4-day history of fatigue, body aches and night sweats.  COVID test negative.  He states now that he is having dysuria, but the body aches and night sweats seems to have resolved at this time.  Patient denies any modifying or aggravating factors.  Patient denies any gross hematuria or suprapubic/flank pain.  Patient denies any fevers, chills, nausea or vomiting.    PMH: Past Medical History:  Diagnosis Date   Atrial fibrillation (HCC)    BPH (benign prostatic hyperplasia)    CAD (coronary artery disease)    chronically occluded RCA   Cancer ([place])    TRANSITIONAL CELL BLADDER CANCER   Dysrhythmia    a fib/flutter   ED (erectile dysfunction)    History of  kidney stones    Hypercholesterolemia    Hypertension    RCA occlusion ([place])    Sleep apnea    Testicle pain    Tremor    Vasovagal syncope     Surgical History: Past Surgical History:  Procedure Laterality Date   APPENDECTOMY     CARDIAC CATHETERIZATION     COLONOSCOPY     COLONOSCOPY WITH PROPOFOL N/A 08/05/2019   Procedure: COLONOSCOPY WITH PROPOFOL;  Surgeon: [name], MD;  Location: [facility];  Service: Endoscopy;  Laterality: N/A;   CYSTOSCOPY WITH BIOPSY N/A 01/02/2016   Procedure: CYSTOSCOPY WITH BIOPSY;  Surgeon: [name], MD;  Location: [facility];  Service: Urology;  Laterality: N/A;   FACIAL RECONSTRUCTION SURGERY     heart ablation     HERNIA REPAIR     LOOP RECORDER INSERTION N/A 01/14/2017   Procedure: LOOP RECORDER INSERTION;  Surgeon: [name], MD;  Location: [place] [facility];  Service: Cardiovascular;  Laterality: N/A;   staples to head (posterior Right) Right 12/2016   fell and hit back of head    TONSILLECTOMY     VASECTOMY      Home Medications:  Allergies as of 11/07/2020   No Known Allergies      Medication List        Accurate as of November 07, 2020 11:59 PM. If you have any questions, ask your nurse or doctor.  amLODipine 5 MG tablet Commonly known as: NORVASC Take 5 mg by mouth daily.   aspirin EC 81 MG tablet Take 81 mg by mouth every evening.   cetirizine 10 MG tablet Commonly known as: ZYRTEC Take 10 mg by mouth daily.   finasteride 5 MG tablet Commonly known as: PROSCAR Take 1 tablet (5 mg total) by mouth daily.   lovastatin 20 MG tablet Commonly known as: MEVACOR Take 40 mgs by mouth once daily in the evening   LUBRICATING EYE DROPS OP Place 1 drop into both eyes daily as needed (dry eyes).   Multi-Vitamins Tabs Take 0.5 tablets by mouth 2 (two) times daily.   sulfamethoxazole-trimethoprim 800-160 MG tablet Commonly known as: BACTRIM DS Take 1 tablet by mouth every 12  (twelve) hours. Started by: Zara Council, PA-C        Allergies: No Known Allergies  Family History: Family History  Problem Relation Age of Onset   Nephrotic syndrome Son    CAD Mother    Diabetes Mother    Throat cancer Father    Prostate cancer Neg Hx    Kidney cancer Neg Hx    Bladder Cancer Neg Hx     Social History:  reports that he has never smoked. His smokeless tobacco use includes chew. He reports current alcohol use of about 1.0 standard drink per week. He reports that he does not use drugs.  ROS: Pertinent ROS in HPI  Physical Exam: BP (!) 145/84   Pulse 66   Temp 98.5 F (36.9 C) (Oral)   Ht 5\' 9"  (1.753 m)   Wt 199 lb (90.3 kg)   BMI 29.39 kg/m   Constitutional:  Well nourished. Alert and oriented, No acute distress. HEENT: [place] AT, mask in place.  Trachea midline Cardiovascular: No clubbing, cyanosis, or edema. Respiratory: Normal respiratory effort, no increased work of breathing. Lymph: No cervical or inguinal adenopathy. Neurologic: Grossly intact, no focal deficits, moving all 4 extremities. Psychiatric: Normal mood and affect.  Laboratory Data: Urinalysis Component     Latest Ref Rng & Units 11/07/2020  Specific Gravity, UA     1.005 - 1.030 1.025  pH, UA     5.0 - 7.5 6.0  Color, UA     Yellow Yellow  Appearance Ur     Clear Clear  Leukocytes,UA     Negative Trace (A)  Protein,UA     Negative/Trace 2+ (A)  Glucose, UA     Negative Negative  Ketones, UA     Negative Trace (A)  RBC, UA     Negative 3+ (A)  Bilirubin, UA     Negative Negative  Urobilinogen, Ur     0.2 - 1.0 mg/dL 2.0 (H)  Nitrite, UA     Negative Negative  Microscopic Examination      See below:   Component     Latest Ref Rng & Units 11/07/2020  WBC, UA     0 - 5 /hpf 11-30 (A)  RBC     0 - 2 /hpf >30 (H)  Epithelial Cells (non renal)     0 - 10 /hpf 0-10  Bacteria, UA     None seen/Few Few (A)  I have reviewed the labs.   Pertinent  Imaging: Results for HENLEY, BOETTNER (MRN [id number]) as of 11/07/2020 14:19  Ref. Range 11/07/2020 14:16  Scan Result Unknown 16 ml       Assessment & Plan:    1. Dysuria -UA grossly infected -urine  sent for culture -Septra DS, started empirically   2. Microscopic hematuria -RUS pending  Return for pending urine culture results .  These notes generated with voice recognition software. I apologize for typographical errors.  Zara Council, PA-C  [facility] [address]  [place], [place] [address] [phone]

## 2020-11-07 NOTE — Telephone Encounter (Signed)
Patient left a voice mail today and states he has been having chilis and burning with urination . He went to the ugrent care yesterday for a Coivd test , which was negative.  He wants to know if he needs an antibiotic.

## 2020-11-07 NOTE — Telephone Encounter (Signed)
This should be a same-day visit with Larene Beach if possible for UA/ UCx.  [name], MD

## 2020-11-07 NOTE — Telephone Encounter (Signed)
Called and spoke with patient and he was added on to Shannon's schedule today to rule out UTI, patient complains of chills (unable to take proper temp.) and dysuria. Patient is about 1 week post cysto.

## 2020-11-08 ENCOUNTER — Ambulatory Visit: Payer: Medicare Other | Admitting: Urology

## 2020-11-10 LAB — CULTURE, URINE COMPREHENSIVE

## 2020-11-12 ENCOUNTER — Other Ambulatory Visit: Payer: Self-pay | Admitting: *Deleted

## 2020-11-12 MED ORDER — CIPROFLOXACIN HCL 250 MG PO TABS: 250.0000 mg | ORAL_TABLET | Freq: Two times a day (BID) | ORAL | 0 refills | Status: AC

## 2020-12-05 ENCOUNTER — Ambulatory Visit
Admission: RE | Admit: 2020-12-05 | Discharge: 2020-12-05 | Disposition: A | Discharge status: Home / Self Care | Payer: Medicare Other | Source: Ambulatory Visit | Attending: Urology | Admitting: Urology

## 2020-12-05 ENCOUNTER — Other Ambulatory Visit: Payer: Self-pay

## 2020-12-05 DIAGNOSIS — R3129 Other microscopic hematuria: Secondary | ICD-10-CM | POA: Insufficient documentation

## 2020-12-10 ENCOUNTER — Other Ambulatory Visit: Payer: Self-pay

## 2020-12-10 ENCOUNTER — Telehealth: Payer: Self-pay | Admitting: *Deleted

## 2020-12-10 DIAGNOSIS — R3129 Other microscopic hematuria: Secondary | ICD-10-CM

## 2020-12-10 NOTE — Telephone Encounter (Addendum)
Patient advised, voiced understanding.   ----- Message from [name], MD sent at 12/07/2020  7:42 AM EST ----- Renal ultrasound looks great, see you next year.  [name], MD

## 2020-12-11 ENCOUNTER — Other Ambulatory Visit: Payer: Medicare Other

## 2020-12-11 ENCOUNTER — Other Ambulatory Visit: Payer: Self-pay

## 2020-12-11 DIAGNOSIS — R3129 Other microscopic hematuria: Secondary | ICD-10-CM

## 2020-12-11 LAB — URINALYSIS, COMPLETE
Bilirubin, UA: NEGATIVE
Glucose, UA: NEGATIVE
Ketones, UA: NEGATIVE
Nitrite, UA: NEGATIVE
Protein,UA: NEGATIVE
Specific Gravity, UA: 1.015 (ref 1.005–1.030)
Urobilinogen, Ur: 0.2 mg/dL (ref 0.2–1.0)
pH, UA: 7 (ref 5.0–7.5)

## 2020-12-11 LAB — MICROSCOPIC EXAMINATION: Bacteria, UA: NONE SEEN

## 2020-12-15 LAB — CULTURE, URINE COMPREHENSIVE

## 2021-01-15 DIAGNOSIS — I77810 Thoracic aortic ectasia: Secondary | ICD-10-CM

## 2021-01-15 HISTORY — DX: Thoracic aortic ectasia: I77.810

## 2021-02-10 ENCOUNTER — Other Ambulatory Visit: Payer: Self-pay

## 2021-02-10 ENCOUNTER — Ambulatory Visit
Admission: EM | Admit: 2021-02-10 | Discharge: 2021-02-10 | Disposition: A | Discharge status: Home / Self Care | Payer: Medicare HMO | Attending: Emergency Medicine | Admitting: Emergency Medicine

## 2021-02-10 DIAGNOSIS — G4730 Sleep apnea, unspecified: Secondary | ICD-10-CM | POA: Insufficient documentation

## 2021-02-10 DIAGNOSIS — I10 Essential (primary) hypertension: Secondary | ICD-10-CM | POA: Insufficient documentation

## 2021-02-10 DIAGNOSIS — N400 Enlarged prostate without lower urinary tract symptoms: Secondary | ICD-10-CM | POA: Insufficient documentation

## 2021-02-10 DIAGNOSIS — J069 Acute upper respiratory infection, unspecified: Secondary | ICD-10-CM | POA: Diagnosis not present

## 2021-02-10 DIAGNOSIS — Z79899 Other long term (current) drug therapy: Secondary | ICD-10-CM | POA: Diagnosis not present

## 2021-02-10 DIAGNOSIS — E785 Hyperlipidemia, unspecified: Secondary | ICD-10-CM | POA: Diagnosis not present

## 2021-02-10 DIAGNOSIS — Z8249 Family history of ischemic heart disease and other diseases of the circulatory system: Secondary | ICD-10-CM | POA: Diagnosis not present

## 2021-02-10 DIAGNOSIS — Z7982 Long term (current) use of aspirin: Secondary | ICD-10-CM | POA: Insufficient documentation

## 2021-02-10 DIAGNOSIS — Z20822 Contact with and (suspected) exposure to covid-19: Secondary | ICD-10-CM | POA: Diagnosis not present

## 2021-02-10 DIAGNOSIS — R062 Wheezing: Secondary | ICD-10-CM | POA: Insufficient documentation

## 2021-02-10 DIAGNOSIS — I2510 Atherosclerotic heart disease of native coronary artery without angina pectoris: Secondary | ICD-10-CM | POA: Insufficient documentation

## 2021-02-10 LAB — RESP PANEL BY RT-PCR (FLU A&B, COVID) ARPGX2
Influenza A by PCR: NEGATIVE
Influenza B by PCR: NEGATIVE
SARS Coronavirus 2 by RT PCR: NEGATIVE

## 2021-02-10 MED ORDER — BENZONATATE 100 MG PO CAPS: ORAL_CAPSULE | ORAL | 0 refills | Status: DC

## 2021-02-10 MED ORDER — ALBUTEROL SULFATE HFA 108 (90 BASE) MCG/ACT IN AERS: 2.0000 | INHALATION_SPRAY | Freq: Four times a day (QID) | RESPIRATORY_TRACT | 0 refills | Status: DC | PRN

## 2021-02-10 NOTE — Discharge Instructions (Addendum)
Recommend start Tessalon cough pills 1 to 2 every 8 hours as needed. Recommend use Albuterol inhaler 2 puffs every 6 hours as needed for cough or wheezing. Continue to push fluids. Rest. May continue Tussin cough syrup as needed. Follow-up with your PCP in 3 to 4 days if not improving.

## 2021-02-10 NOTE — ED Triage Notes (Signed)
Patient is here for "Cough/Congestion". Started with "Cough" about 3 days ago., a lot of congestion now with sneezing. No fever. No sob "known". @ home COVID19 test "Negative".

## 2021-02-10 NOTE — ED Provider Notes (Signed)
[facility]    CSN: [id number] Arrival date & time: 02/10/21  0947      History   Chief Complaint Chief Complaint  Patient presents with   Cough    HPI [name] is a 72 y.o. male.   71 year old male presents with cough, nasal congestion, sneezing, body aches and fatigue for the past 3 days. Slight shortness of breath with deep coughing. Denies any fever or GI symptoms. Has taken OTC Tussin cough medication with some success. Wife was sick last week with similar symptoms and recovering. Has taken home COVID 19 test with negative results. Other chronic health issues include HTN, CAD, Hyperlipidemia, BPH, and sleep apnea. Currently on Norvasc, Lovastatin, aspirin, Proscar and Zyrtec daily.   The history is provided by the patient.   Past Medical History:  Diagnosis Date   Atrial fibrillation (HCC)    BPH (benign prostatic hyperplasia)    CAD (coronary artery disease)    chronically occluded RCA   Cancer (HCC)    TRANSITIONAL CELL BLADDER CANCER   Dysrhythmia    a fib/flutter   ED (erectile dysfunction)    History of kidney stones    Hypercholesterolemia    Hypertension    RCA occlusion ([place])    Sleep apnea    Testicle pain    Tremor    Vasovagal syncope     Patient Active Problem List   Diagnosis Date Noted   Syncope 12/30/2016   Sleep apnea 11/08/2015   S/P ablation of atrial flutter 11/30/2013   A-fib ([place]) 11/19/2012   CAD (coronary artery disease) 11/19/2012   Atrial flutter ([place]) 11/18/2012   RCA occlusion ([place]) 11/18/2012    Past Surgical History:  Procedure Laterality Date   APPENDECTOMY     CARDIAC CATHETERIZATION     COLONOSCOPY     COLONOSCOPY WITH PROPOFOL N/A 08/05/2019   Procedure: COLONOSCOPY WITH PROPOFOL;  Surgeon: [name], MD;  Location: [facility] ENDOSCOPY;  Service: Endoscopy;  Laterality: N/A;   CYSTOSCOPY WITH BIOPSY N/A 01/02/2016   Procedure: CYSTOSCOPY WITH BIOPSY;  Surgeon: [name], MD;  Location: [facility]  ORS;  Service: Urology;  Laterality: N/A;   FACIAL RECONSTRUCTION SURGERY     heart ablation     HERNIA REPAIR     LOOP RECORDER INSERTION N/A 01/14/2017   Procedure: LOOP RECORDER INSERTION;  Surgeon: [name], MD;  Location: [place] [facility];  Service: Cardiovascular;  Laterality: N/A;   staples to head (posterior Right) Right 12/2016   fell and hit back of head    TONSILLECTOMY     VASECTOMY         Home Medications    Prior to Admission medications   Medication Sig Start Date End Date Taking? Authorizing Provider  albuterol (VENTOLIN HFA) 108 (90 Base) MCG/ACT inhaler Inhale 2 puffs into the lungs every 6 (six) hours as needed for wheezing or shortness of breath. 02/10/21  Yes Pranay Hilbun, Nicholes Stairs, NP  amLODipine (NORVASC) 5 MG tablet Take 5 mg by mouth daily. 08/04/17  Yes [provider]  aspirin EC 81 MG tablet Take 81 mg by mouth every evening.    Yes [provider]  benzonatate (TESSALON PERLES) 100 MG capsule Take 1 to 2 capsules by mouth every 8 hours as needed for cough. 02/10/21  Yes Mirabelle Cyphers, Nicholes Stairs, NP  finasteride (PROSCAR) 5 MG tablet Take 1 tablet (5 mg total) by mouth daily. 10/30/20  Yes [name], MD  lovastatin (MEVACOR)  20 MG tablet Take 40 mgs by mouth once daily in the evening 04/16/15  Yes [provider]  Carboxymethylcellul-Glycerin (LUBRICATING EYE DROPS OP) Place 1 drop into both eyes daily as needed (dry eyes).    [provider]  cetirizine (ZYRTEC) 10 MG tablet Take 10 mg by mouth daily.    [provider]  Multiple Vitamin (MULTI-VITAMINS) TABS Take 0.5 tablets by mouth 2 (two) times daily.     [provider]    Family History Family History  Problem Relation Age of Onset   Nephrotic syndrome Son    CAD Mother    Diabetes Mother    Throat cancer Father    Prostate cancer Neg Hx    Kidney cancer Neg Hx    Bladder Cancer Neg Hx     Social History Social History   Tobacco Use    Smoking status: Never   Smokeless tobacco: Current    Types: Chew  Vaping Use   Vaping Use: Never used  Substance Use Topics   Alcohol use: Yes    Alcohol/week: 1.0 standard drink    Types: 1 Cans of beer per week    Comment: occasional   Drug use: No     Allergies   Patient has no known allergies.   Review of Systems Review of Systems  Constitutional:  Positive for activity change and fatigue. Negative for appetite change, chills and fever.  HENT:  Positive for congestion, postnasal drip, sinus pressure and sneezing. Negative for ear discharge, ear pain, facial swelling, mouth sores, nosebleeds, sinus pain and trouble swallowing.   Eyes:  Negative for pain, discharge, redness and itching.  Respiratory:  Positive for cough. Negative for chest tightness and wheezing.   Gastrointestinal:  Negative for diarrhea, nausea and vomiting.  Musculoskeletal:  Positive for myalgias. Negative for neck pain and neck stiffness.  Skin:  Negative for color change and rash.  Allergic/Immunologic: Positive for environmental allergies. Negative for food allergies and immunocompromised state.  Neurological:  Negative for dizziness, tremors, seizures, speech difficulty, light-headedness and numbness.  Hematological:  Negative for adenopathy. Bruises/bleeds easily.    Physical Exam Triage Vital Signs ED Triage Vitals  Enc Vitals Group     BP 02/10/21 1004 139/84     Pulse Rate 02/10/21 1004 87     Resp 02/10/21 1004 20     Temp 02/10/21 1004 99.6 F (37.6 C)     Temp Source 02/10/21 1004 Oral     SpO2 02/10/21 1004 100 %     Weight 02/10/21 1007 199 lb 1.2 oz (90.3 kg)     Height 02/10/21 1007 5\' 9"  (1.753 m)     Head Circumference --      Peak Flow --      Pain Score 02/10/21 1004 0     Pain Loc --      Pain Edu? --      Excl. in [place]? --    No data found.  Updated Vital Signs BP 139/84 (BP Location: Left Arm)    Pulse 87    Temp 99.6 F (37.6 C) (Oral)    Resp 20    Ht 5\' 9"   (1.753 m)    Wt 199 lb 1.2 oz (90.3 kg)    SpO2 100%    BMI 29.40 kg/m   Visual Acuity Right Eye Distance:   Left Eye Distance:   Bilateral Distance:    Right Eye Near:   Left Eye Near:    Bilateral  Near:     Physical Exam Vitals and nursing note reviewed.  Constitutional:      General: He is awake. He is not in acute distress.    Appearance: He is well-developed and well-groomed. He is ill-appearing.     Comments: He is sitting on the exam table in no acute distress but appears tired and ill.   HENT:     Head: Normocephalic and atraumatic.     Right Ear: Hearing, tympanic membrane, ear canal and external ear normal.     Left Ear: Hearing, tympanic membrane, ear canal and external ear normal.     Nose: Congestion present.     Right Sinus: No maxillary sinus tenderness or frontal sinus tenderness.     Left Sinus: No maxillary sinus tenderness or frontal sinus tenderness.     Mouth/Throat:     Lips: Pink.     Mouth: Mucous membranes are moist.     Pharynx: Uvula midline. Posterior oropharyngeal erythema present. No pharyngeal swelling, oropharyngeal exudate or uvula swelling.  Eyes:     General: Lids are normal.     Extraocular Movements: Extraocular movements intact.     Conjunctiva/sclera:     Right eye: Right conjunctiva is injected.     Left eye: Left conjunctiva is injected.     Comments: Both conjunctiva mildly injected.   Cardiovascular:     Rate and Rhythm: Normal rate and regular rhythm.     Heart sounds: Normal heart sounds. No murmur heard. Pulmonary:     Effort: Pulmonary effort is normal. No respiratory distress or retractions.     Breath sounds: Normal air entry. No decreased air movement. Examination of the right-upper field reveals wheezing. Examination of the left-upper field reveals wheezing. Wheezing present. No decreased breath sounds, rhonchi or rales.     Comments: Mild wheezes heard mainly in upper lobes with deep breathing and coughing.   Musculoskeletal:     Cervical back: Normal range of motion and neck supple.  Lymphadenopathy:     Cervical: No cervical adenopathy.  Skin:    General: Skin is warm and dry.     Capillary Refill: Capillary refill takes less than 2 seconds.     Findings: No rash.  Neurological:     General: No focal deficit present.     Mental Status: He is alert and oriented to person, place, and time.  Psychiatric:        Mood and Affect: Mood normal.        Behavior: Behavior normal. Behavior is cooperative.        Thought Content: Thought content normal.        Judgment: Judgment normal.     UC Treatments / Results  Labs (all labs ordered are listed, but only abnormal results are displayed) Labs Reviewed  RESP PANEL BY RT-PCR (FLU A&B, COVID) [id number]    EKG   Radiology No results found.  Procedures Procedures (including critical care time)  Medications Ordered in UC Medications - No data to display  Initial Impression / Assessment and Plan / UC Course  I have reviewed the triage vital signs and the nursing notes.  Pertinent labs & imaging results that were available during my care of the patient were reviewed by me and considered in my medical decision making (see chart for details).     Reviewed negative rapid COVID test and Influenza test results with patient. Discussed that he probably has a viral upper respiratory illness with some wheezing. No indication  for antibiotics at this time. Recommend Tessalon cough pills 100-200mg  every 8 hours as needed. Recommend use Albuterol inhaler 2 puffs every 6 hours as needed for cough or wheezing. Rest. Continue to push fluids to help loosen up mucus in sinuses and chest. May continue OTC Tussin cough syrup as needed. Follow-up with his PCP in 3 to 4 days if not improving.    Final Clinical Impressions(s) / UC Diagnoses   Final diagnoses:  Viral URI with cough  Wheezing     Discharge Instructions      Recommend start Tessalon cough  pills 1 to 2 every 8 hours as needed. Recommend use Albuterol inhaler 2 puffs every 6 hours as needed for cough or wheezing. Continue to push fluids. Rest. May continue Tussin cough syrup as needed. Follow-up with your PCP in 3 to 4 days if not improving.     ED Prescriptions     Medication Sig Dispense Auth. Provider   benzonatate (TESSALON PERLES) 100 MG capsule Take 1 to 2 capsules by mouth every 8 hours as needed for cough. 21 capsule Katy Apo, NP   albuterol (VENTOLIN HFA) 108 (90 Base) MCG/ACT inhaler Inhale 2 puffs into the lungs every 6 (six) hours as needed for wheezing or shortness of breath. 18 g Katy Apo, NP      PDMP not reviewed this encounter.   Katy Apo, NP 02/11/21 [phone]

## 2021-02-10 NOTE — ED Triage Notes (Signed)
Note: "Maybe" sob when cough is bad. Also body aches "and very tired". Discussed COVID19 (swab) in intake prior to provider seeing him, ok.

## 2021-04-18 ENCOUNTER — Encounter: Payer: Self-pay | Admitting: Physician Assistant

## 2021-04-18 ENCOUNTER — Ambulatory Visit (INDEPENDENT_AMBULATORY_CARE_PROVIDER_SITE_OTHER): Payer: Medicare HMO | Admitting: Physician Assistant

## 2021-04-18 VITALS — BP 127/80 | HR 70 | Ht 69.0 in | Wt 199.0 lb

## 2021-04-18 DIAGNOSIS — R300 Dysuria: Secondary | ICD-10-CM

## 2021-04-18 LAB — URINALYSIS, COMPLETE
Bilirubin, UA: NEGATIVE
Glucose, UA: NEGATIVE
Ketones, UA: NEGATIVE
Nitrite, UA: POSITIVE — AB
Protein,UA: NEGATIVE
Specific Gravity, UA: 1.02 (ref 1.005–1.030)
Urobilinogen, Ur: 1 mg/dL (ref 0.2–1.0)
pH, UA: 7.5 (ref 5.0–7.5)

## 2021-04-18 LAB — MICROSCOPIC EXAMINATION

## 2021-04-18 LAB — BLADDER SCAN AMB NON-IMAGING: Scan Result: 12

## 2021-04-18 MED ORDER — SULFAMETHOXAZOLE-TRIMETHOPRIM 800-160 MG PO TABS: 1.0000 | ORAL_TABLET | Freq: Two times a day (BID) | ORAL | 0 refills | Status: AC

## 2021-04-18 NOTE — Progress Notes (Signed)
? ?04/18/2021 ?9:45 AM  ? ?Pat[place] ?18-Jul-1950 ?[id number] ? ?CC: ?Chief Complaint  ?Patient presents with  ? Dysuria  ? ? ?HPI: ?[name] is a 71 y.o. male with PMH bladder cancer up-to-date with annual surveillance cystoscopy, hematuria, and BPH with LUTS who discontinued tamsulosin and finasteride who presents today for evaluation of possible UTI.  ? ?Today he reports an approximate 5-day history of frequency, urgency, and dysuria.  He is circumcised.  He has not been taking any medication at home to manage his symptoms ? ?In-office UA today positive for 2+ blood, nitrites, and 1+ leukocyte esterase; urine microscopy with 3-10 RBCs/HPF, amorphous crystals, and many bacteria. PVR 70m. ? ?PMH: ?Past Medical History:  ?Diagnosis Date  ? Atrial fibrillation ([place]   ? BPH (benign prostatic hyperplasia)   ? CAD (coronary artery disease)   ? chronically occluded RCA  ? Cancer ([facility]   ? TRANSITIONAL CELL BLADDER CANCER  ? Dysrhythmia   ? a fib/flutter  ? ED (erectile dysfunction)   ? History of kidney stones   ? Hypercholesterolemia   ? Hypertension   ? [place]   ? Sleep apnea   ? Testicle pain   ? Tremor   ? Vasovagal syncope   ? ? ?Surgical History: ?Past Surgical History:  ?Procedure Laterality Date  ? APPENDECTOMY    ? CARDIAC CATHETERIZATION    ? COLONOSCOPY    ? COLONOSCOPY WITH PROPOFOL N/A 08/05/2019  ? Procedure: COLONOSCOPY WITH PROPOFOL;  Surgeon: LLesly Rubenstein MD;  [facility];  Service: Endoscopy;  Laterality: N/A;  ? CYSTOSCOPY WITH BIOPSY N/A 01/02/2016  ? Procedure: CYSTOSCOPY WITH BIOPSY;  Surgeon: AHollice Espy MD;  Location: [facility] ORS;  Service: Urology;  Laterality: N/A;  ? FACIAL RECONSTRUCTION SURGERY    ? heart ablation    ? HERNIA REPAIR    ? LOOP RECORDER INSERTION N/A 01/14/2017  ? Procedure: LOOP RECORDER INSERTION;  Surgeon: PIsaias Cowman MD;  [facility];  Service: Cardiovascular;  Laterality: N/A;  ? staples to head  (posterior Right) Right 12/2016  ? fell and hit back of head   ? TONSILLECTOMY    ? VASECTOMY    ? ? ?Home Medications:  ?Allergies as of 04/18/2021   ?No Known Allergies ?  ? ?  ?Medication List  ?  ? ?  ? Accurate as of April 18, 2021  9:45 AM. If you have any questions, ask your nurse or doctor.  ?  ?  ? ?  ? ?STOP taking these medications   ? ?benzonatate 100 MG capsule ?Commonly known as: TBest boy?Stopped by: SDebroah Loop PA-C ?  ? ?  ? ?TAKE these medications   ? ?albuterol 108 (90 Base) MCG/ACT inhaler ?Commonly known as: VENTOLIN HFA ?Inhale 2 puffs into the lungs every 6 (six) hours as needed for wheezing or shortness of breath. ?  ?amLODipine 5 MG tablet ?Commonly known as: NORVASC ?Take 5 mg by mouth daily. ?  ?aspirin EC 81 MG tablet ?Take 81 mg by mouth every evening. ?  ?cetirizine 10 MG tablet ?Commonly known as: ZYRTEC ?Take 10 mg by mouth daily. ?  ?finasteride 5 MG tablet ?Commonly known as: PROSCAR ?Take 1 tablet (5 mg total) by mouth daily. ?  ?lovastatin 40 MG tablet ?Commonly known as: MEVACOR ?SMARTSIG:1 Tablet(s) By Mouth Every Evening ?What changed: Another medication with the same name was removed. Continue taking this medication, and follow the directions you see here. ?  Changed by: Debroah Loop, PA-C ?  ?LUBRICATING EYE DROPS OP ?Place 1 drop into both eyes daily as needed (dry eyes). ?  ?Multi-Vitamins Tabs ?Take 0.5 tablets by mouth 2 (two) times daily. ?  ? ?  ? ? ?Allergies:  ?No Known Allergies ? ?Family History: ?Family History  ?Problem Relation Age of Onset  ? Nephrotic syndrome Son   ? CAD Mother   ? Diabetes Mother   ? Throat cancer Father   ? Prostate cancer Neg Hx   ? Kidney cancer Neg Hx   ? Bladder Cancer Neg Hx   ? ? ?Social History:  ? reports that he has never smoked. His smokeless tobacco use includes chew. He reports current alcohol use of about 1.0 standard drink per week. He reports that he does not use drugs. ? ?Physical Exam: ?BP 127/80    Pulse 70   Ht '5\' 9"'$  (1.753 m)   Wt 199 lb (90.3 kg)   BMI 29.39 kg/m?   ?Constitutional:  Alert and oriented, no acute distress, nontoxic appearing ?HEENT: [place], AT ?Cardiovascular: No clubbing, cyanosis, or edema ?Respiratory: Normal respiratory effort, no increased work of breathing ?Skin: No rashes, bruises or suspicious lesions ?Neurologic: Grossly intact, no focal deficits, moving all 4 extremities ?Psychiatric: Normal mood and affect ? ?Laboratory Data: ?Results for orders placed or performed in visit on 04/18/21  ?Microscopic Examination  ? Urine  ?Result Value Ref Range  ? WBC, UA 0-5 0 - 5 /hpf  ? RBC 3-10 (A) 0 - 2 /hpf  ? Epithelial Cells (non renal) 0-10 0 - 10 /hpf  ? Crystals Present (A) N/A  ? Crystal Type Amorphous Sediment N/A  ? Bacteria, UA Many (A) None seen/Few  ?Urinalysis, Complete  ?Result Value Ref Range  ? Specific Gravity, UA 1.020 1.005 - 1.030  ? pH, UA 7.5 5.0 - 7.5  ? Color, UA Yellow Yellow  ? Appearance Ur Cloudy (A) Clear  ? Leukocytes,UA 1+ (A) Negative  ? Protein,UA Negative Negative/Trace  ? Glucose, UA Negative Negative  ? Ketones, UA Negative Negative  ? RBC, UA 2+ (A) Negative  ? Bilirubin, UA Negative Negative  ? Urobilinogen, Ur 1.0 0.2 - 1.0 mg/dL  ? Nitrite, UA Positive (A) Negative  ? Microscopic Examination See below:   ?BLADDER SCAN AMB NON-IMAGING  ?Result Value Ref Range  ? Scan Result 12   ? ?Assessment & Plan:   ?1. Dysuria ?Microscopic hematuria and bacteriuria on UA today.  Will start empiric Bactrim and send for culture for further evaluation.  He is emptying appropriately today.  Will defer repeat UA given his known history of hematuria. ?- Urinalysis, Complete ?- CULTURE, URINE COMPREHENSIVE ?- BLADDER SCAN AMB NON-IMAGING ?- sulfamethoxazole-trimethoprim (BACTRIM DS) 800-160 MG tablet; Take 1 tablet by mouth 2 (two) times daily for 7 days.  Dispense: 14 tablet; Refill: 0  ? ?Return if symptoms worsen or fail to improve. ? ?Debroah Loop,  PA-C ? ?[place] ?[address], [address] ?[place], [place] [address] ?[phone] ?   ?

## 2021-04-24 LAB — CULTURE, URINE COMPREHENSIVE

## 2021-04-26 ENCOUNTER — Other Ambulatory Visit: Payer: Self-pay | Admitting: *Deleted

## 2021-04-26 MED ORDER — NITROFURANTOIN MONOHYD MACRO 100 MG PO CAPS
100.0000 mg | ORAL_CAPSULE | Freq: Two times a day (BID) | ORAL | 0 refills | Status: AC
Start: 2021-04-26 — End: 2021-05-03

## 2021-05-01 ENCOUNTER — Other Ambulatory Visit: Payer: Self-pay | Admitting: Urology

## 2021-05-07 ENCOUNTER — Ambulatory Visit
Admission: EM | Admit: 2021-05-07 | Discharge: 2021-05-07 | Disposition: A | Discharge status: Home / Self Care | Payer: Medicare HMO | Attending: Internal Medicine | Admitting: Internal Medicine

## 2021-05-07 DIAGNOSIS — R109 Unspecified abdominal pain: Secondary | ICD-10-CM | POA: Diagnosis present

## 2021-05-07 DIAGNOSIS — R8281 Pyuria: Secondary | ICD-10-CM | POA: Insufficient documentation

## 2021-05-07 DIAGNOSIS — R8271 Bacteriuria: Secondary | ICD-10-CM | POA: Diagnosis present

## 2021-05-07 LAB — URINALYSIS, MICROSCOPIC (REFLEX): WBC, UA: >50 WBC/hpf (ref 0–5)

## 2021-05-07 LAB — URINALYSIS, ROUTINE W REFLEX MICROSCOPIC
Bilirubin Urine: NEGATIVE
Glucose, UA: NEGATIVE mg/dL
Ketones, ur: NEGATIVE mg/dL
Nitrite: NEGATIVE
Specific Gravity, Urine: 1.02 (ref 1.005–1.030)
pH: 7 (ref 5.0–8.0)

## 2021-05-07 MED ORDER — CEPHALEXIN 500 MG PO CAPS
500.0000 mg | ORAL_CAPSULE | Freq: Two times a day (BID) | ORAL | 0 refills | Status: AC
Start: 2021-05-07 — End: 2021-05-17

## 2021-05-07 MED ORDER — ONDANSETRON HCL 4 MG PO TABS: 8.0000 mg | ORAL_TABLET | ORAL | 0 refills | Status: DC | PRN

## 2021-05-07 NOTE — ED Triage Notes (Signed)
Pt states he had UTI and was treated with antibiotics that he finished last Friday.  ?

## 2021-05-07 NOTE — ED Triage Notes (Signed)
Patient presents to Urgent Care with complaints of left flank pain since last night. Weakness today and has worsened.  ? ?Denies fever or any urinary symptoms.  ?

## 2021-05-07 NOTE — Discharge Instructions (Addendum)
Symptoms and exam today suggest a urinary source for the left flank/lower abdominal discomfort; urinalysis today was positive for WBCs, RBCs, and a few bacteria.  A culture is pending.  Prescription for cephalexin (antibiotic) was sent to the pharmacy, with ondansetron for nausea.  The urgent care will contact you if a change is needed based on culture results.  Anticipate gradual improvement over the next several days.  Recheck or go to ER if abdominal pain worsens or does not subside after starting the antibiotic, for repeated episodes of vomiting, or for new fever >100.5. ?

## 2021-05-07 NOTE — ED Provider Notes (Signed)
?[place] ? ? ? ?CSN: [id number] ?Arrival date & time: 05/07/21  1058 ? ? ?  ? ?History   ?Chief Complaint ?Chief Complaint  ?Patient presents with  ? Flank Pain  ? Weakness  ? ? ?HPI ?[name] is a 71 y.o. male. He has PMHx notable for treated bladder cancer, follows with Dr [name].  He was treated early in April for UTI with bactrim, then with nitrofurantoin; prescription for nitrofurantoin finished am 4/21.   ? ?He had the onset of left flank pain last evening, radiating into LLQ.  Pain is fairly constant, nothing really makes it better or worse.  Not positional.  Dull pain.  Feels worse if sitting up with pants/belt fastened.   ? ?No fever.  Nauseous, not vomiting.  Normal BM yesterday, no diarrhea.  Stable cough for the last couple-3 years.   ? ?Hx remote appendectomy, bladder cancer surgery, R hernia repair.   ? ? ?Flank Pain ? ?Weakness ? ?Past Medical History:  ?Diagnosis Date  ? Atrial fibrillation ([place])   ? BPH (benign prostatic hyperplasia)   ? CAD (coronary artery disease)   ? chronically occluded RCA  ? Cancer [facility])   ? TRANSITIONAL CELL BLADDER CANCER  ? Dysrhythmia   ? a fib/flutter  ? ED (erectile dysfunction)   ? History of kidney stones   ? Hypercholesterolemia   ? Hypertension   ? RCA occlusion ([place])   ? Sleep apnea   ? Testicle pain   ? Tremor   ? Vasovagal syncope   ? ? ?Patient Active Problem List  ? Diagnosis Date Noted  ? Syncope 12/30/2016  ? Sleep apnea 11/08/2015  ? S/P ablation of atrial flutter 11/30/2013  ? A-fib ([place]) 11/19/2012  ? CAD (coronary artery disease) 11/19/2012  ? Atrial flutter ([place]) 11/18/2012  ? RCA occlusion ([place]) 11/18/2012  ? ? ?Past Surgical History:  ?Procedure Laterality Date  ? APPENDECTOMY    ? CARDIAC CATHETERIZATION    ? COLONOSCOPY    ? COLONOSCOPY WITH PROPOFOL N/A 08/05/2019  ? Procedure: COLONOSCOPY WITH PROPOFOL;  Surgeon: [name], MD;  Location: [facility] ENDOSCOPY;  Service: Endoscopy;  Laterality: N/A;  ? CYSTOSCOPY WITH BIOPSY  N/A 01/02/2016  ? Procedure: CYSTOSCOPY WITH BIOPSY;  Surgeon: [name], MD;  Location: [facility] ORS;  Service: Urology;  Laterality: N/A;  ? FACIAL RECONSTRUCTION SURGERY    ? heart ablation    ? HERNIA REPAIR    ? LOOP RECORDER INSERTION N/A 01/14/2017  ? Procedure: LOOP RECORDER INSERTION;  Surgeon: [name], MD;  Location: [place] CV LAB;  Service: Cardiovascular;  Laterality: N/A;  ? staples to head (posterior Right) Right 12/2016  ? fell and hit back of head   ? TONSILLECTOMY    ? VASECTOMY    ? ? ? ? ? ?Home Medications   ? ?Prior to Admission medications   ?Medication Sig Start Date End Date Taking? Authorizing Provider  ?cephALEXin (KEFLEX) 500 MG capsule Take 1 capsule (500 mg total) by mouth 2 (two) times daily for 10 days. 05/07/21 05/17/21 Yes Wynona Luna, MD  ?ondansetron [facility]) 4 MG tablet Take 2 tablets (8 mg total) by mouth every 4 (four) hours as needed for nausea or vomiting. 05/07/21  Yes Wynona Luna, MD  ?albuterol (VENTOLIN HFA) 108 (90 Base) MCG/ACT inhaler Inhale 2 puffs into the lungs every 6 (six) hours as needed for wheezing or shortness of breath. 02/10/21   Amyot, Nicholes Stairs, NP  ?amLODipine (  NORVASC) 5 MG tablet Take 5 mg by mouth daily. 08/04/17   [provider]  ?aspirin EC 81 MG tablet Take 81 mg by mouth every evening.     [provider]  ?Carboxymethylcellul-Glycerin (LUBRICATING EYE DROPS OP) Place 1 drop into both eyes daily as needed (dry eyes).    [provider]  ?cetirizine (ZYRTEC) 10 MG tablet Take 10 mg by mouth daily.    [provider]  ?finasteride (PROSCAR) 5 MG tablet Take 1 tablet by mouth once daily 05/02/21   [name], MD  ?lovastatin (MEVACOR) 40 MG tablet SMARTSIG:1 Tablet(s) By Mouth Every Evening 02/21/21   [provider]  ?Multiple Vitamin (MULTI-VITAMINS) TABS Take 0.5 tablets by mouth 2 (two) times daily.     [provider]  ? ? ?Family History ?Family History   ?Problem Relation Age of Onset  ? Nephrotic syndrome Son   ? CAD Mother   ? Diabetes Mother   ? Throat cancer Father   ? Prostate cancer Neg Hx   ? Kidney cancer Neg Hx   ? Bladder Cancer Neg Hx   ? ? ?Social History ?Social History  ? ?Tobacco Use  ? Smoking status: Never  ? Smokeless tobacco: Current  ?  Types: Chew  ?Vaping Use  ? Vaping Use: Never used  ?Substance Use Topics  ? Alcohol use: Yes  ?  Alcohol/week: 1.0 standard drink  ?  Types: 1 Cans of beer per week  ?  Comment: occasional  ? Drug use: No  ? ? ? ?Allergies   ?Patient has no known allergies. ? ? ?Review of Systems ?Review of Systems  ?Genitourinary:  Positive for flank pain.  ?Neurological:  Positive for weakness.   See HPI also ? ? ?Physical Exam ?Triage Vital Signs ?ED Triage Vitals  ?Enc Vitals Group  ?   BP 05/07/21 1202 (!) 142/87  ?   Pulse Rate 05/07/21 1202 66  ?   Resp 05/07/21 1202 18  ?   Temp 05/07/21 1202 98.3 ?F (36.8 ?C)  ?   Temp Source 05/07/21 1202 Oral  ?   SpO2 05/07/21 1202 100 %  ?   Weight --   ?   Height --   ?   Pain Score 05/07/21 1201 8  ?   Pain Loc --   ? ? ?Updated Vital Signs ?BP (!) 142/87 (BP Location: Left Arm)   Pulse 66   Temp 98.3 ?F (36.8 ?C) (Oral)   Resp 18   SpO2 100%  ? ?Physical Exam ?Constitutional:   ?   General: He is not in acute distress. ?   Appearance: He is ill-appearing. He is not toxic-appearing.  ?   Comments: Good hygiene ?Looks unwell ?Reclining on stretcher with pants undone  ?HENT:  ?   Mouth/Throat:  ?   Mouth: Mucous membranes are moist.  ?Eyes:  ?   Conjunctiva/sclera:  ?   Right eye: Right conjunctiva is not injected. No exudate. ?   Left eye: Left conjunctiva is not injected. No exudate. ?   Comments:   Conjugate gaze observed  ?Cardiovascular:  ?   Rate and Rhythm: Normal rate and regular rhythm.  ?Pulmonary:  ?   Effort: Pulmonary effort is normal. No respiratory distress.  ?   Breath sounds: No wheezing or rhonchi.  ?Abdominal:  ?   General: Bowel sounds are normal.  ?    Palpations: Abdomen is soft.  ?   Tenderness: There is no  abdominal tenderness. There is no guarding or rebound.  ? ? ?   Comments: Mildly distended, but soft ?Has pain but is not tender ?Describes pain as marked in the diagram  ?Musculoskeletal:  ?   Cervical back: Neck supple.  ?   Comments: Walked into the urgent care independently  ?Skin: ?   General: Skin is warm and dry.  ?   Comments: A little bit pale  ?Neurological:  ?   Mental Status: He is alert.  ?   Comments: Face symmetric, speech clear, coherent, logical  ? ? ? ?UC Treatments / Results  ?Labs ?(all labs ordered are listed, but only abnormal results are displayed) ?Labs Reviewed  ?URINALYSIS, ROUTINE W REFLEX MICROSCOPIC - Abnormal; Notable for the following components:  ?    Result Value  ? APPearance HAZY (*)   ? Hgb urine dipstick MODERATE (*)   ? Protein, ur TRACE (*)   ? Leukocytes,Ua SMALL (*)   ? All other components within normal limits  ?URINALYSIS, MICROSCOPIC (REFLEX) - Abnormal; Notable for the following components:  ? Bacteria, UA FEW (*)   ? All other components within normal limits  ?URINE CULTURE  ?Urine micro positive for 21-50 RB, >50 WBC, clumps of WBCs, few bacteria, 0-5 squames ?Culture pending ? ?EKG ?NA ? ?Radiology ?No results found. ?NA ? ?Procedures ?Procedures (including critical care time) ?NA ? ?Medications Ordered in UC ?Medications - No data to display ?NA ? ?Initial Impression / Assessment and Plan / UC Course  ? ?Differential diagnosis includes UTI, nephrolithiasis, less likely diverticulitis or gastroenteritis given benign abdominal exam; cannot completely exclude ischemic colitis but would not expect urinary findings to be so abnormal (>50 WBCs, clumps of WBCs) if bowel origin of symptoms.   ? ? ?Final Clinical Impressions(s) / UC Diagnoses  ? ?Final diagnoses:  ?Bacteriuria with pyuria  ?Left flank pain  ? ? ? ?Discharge Instructions   ? ?  ?Symptoms and exam today suggest a urinary source for the left flank/lower  abdominal discomfort; urinalysis today was positive for WBCs, RBCs, and a few bacteria.  A culture is pending.  Prescription for cephalexin (antibiotic) was sent to the pharmacy, with ondansetron for nausea.  The urgen

## 2021-05-08 ENCOUNTER — Telehealth: Payer: Self-pay | Admitting: *Deleted

## 2021-05-08 NOTE — Telephone Encounter (Signed)
Patient called in today and states he went to the urgent care in [place] . He was having some left flank pain with a dull ache  and cloudy and discolored. They started him on antibiotic. He just states that he still feels week. A little pain this morning.  ?

## 2021-05-08 NOTE — Telephone Encounter (Addendum)
Patient informed, scheduled follow up. Voiced understanding. ?

## 2021-05-08 NOTE — Telephone Encounter (Signed)
I agree with this management; it does look like he has another urinary tract infection. ? ?I'd like to invite him to come see me in about a month for IPSS/PVR/UA to further discuss his underlying BPH.  He has had several infections and his enlarged prostate is likely contributing.  Would like to discuss further management in details to help prevent these recurrent infections. ? ?[name], MD ? ?

## 2021-05-10 LAB — URINE CULTURE: Culture: 30000 — AB

## 2021-06-03 NOTE — Progress Notes (Signed)
06/04/2021 8:42 AM   [name] 1950-12-15 [id number]  Referring provider:  Ezequiel Kayser, MD [address] [place],  [place] [address] Chief Complaint  Patient presents with   Benign Prostatic Hypertrophy    HPI: [name] is a 71 y.o.male with a personal history of bladder cancer, hematuria, BPH with LUTS, and dysuria, who presents today for a 1 month follow-up with UA, IPSS, and PVR.  CT urogram which showed a 3 mm left lower pole stone on 12/18/15, enlarged prostate with diffuse bladder wall thickening.   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 E has had no recurrences.   He was seen on 04/18/2021 by Debroah Loop, PA-C,  for further evaluation of dysuria. UA had 2+ blood, nitrites, and 1+ leukocyte esterase; urine microscopy with 3-10 RBCs/HPF, amorphous crystals, and many bacteria. Urine culture staphylococcus epidermidis. He was treated with Bactrim. He was treated again for UTI on 05/07/2021 at urgent care.  Prior to that, he had another infection this past fall.  No previous significant UTIs prior to this past 12 months.  IPSS 9 today and PVR 46m.   He has improvement on finasteride he is able to hold his urine. His urgency frequency has improved he feels like he empties well.  He is no longer on flomax and prefers not to go back o nthis med if not needed.     IPSS     Row Name 06/04/21 0800         International Prostate Symptom Score   How often have you had the sensation of not emptying your bladder? Less than 1 in 5     How often have you had to urinate less than every two hours? More than half the time     How often have you found you stopped and started again several times when you urinated? Less than 1 in 5 times     How often have you found it difficult to postpone urination? Less than half the time     How often have you had a weak urinary  stream? Not at All     How often have you had to strain to start urination? Not at All     How many times did you typically get up at night to urinate? 1 Time     Total IPSS Score 9       Quality of Life due to urinary symptoms   If you were to spend the rest of your life with your urinary condition just the way it is now how would you feel about that? Mixed              Score:  1-7 Mild 8-19 Moderate 20-35 Severe     PMH: Past Medical History:  Diagnosis Date   Atrial fibrillation ([facility])    BPH (benign prostatic hyperplasia)    CAD (coronary artery disease)    chronically occluded RCA   Cancer ([facility])    TRANSITIONAL CELL BLADDER CANCER   Dysrhythmia    a fib/flutter   ED (erectile dysfunction)    History of kidney stones    Hypercholesterolemia    Hypertension    [place] ([place]    Sleep apnea    Testicle pain    Tremor    Vasovagal syncope     Surgical History: Past Surgical History:  Procedure  Laterality Date   APPENDECTOMY     CARDIAC CATHETERIZATION     COLONOSCOPY     COLONOSCOPY WITH PROPOFOL N/A 08/05/2019   Procedure: COLONOSCOPY WITH PROPOFOL;  Surgeon: [name], MD;  Location: [facility] ENDOSCOPY;  Service: Endoscopy;  Laterality: N/A;   CYSTOSCOPY WITH BIOPSY N/A 01/02/2016   Procedure: CYSTOSCOPY WITH BIOPSY;  Surgeon: [name], MD;  Location: [facility] ORS;  Service: Urology;  Laterality: N/A;   FACIAL RECONSTRUCTION SURGERY     heart ablation     HERNIA REPAIR     LOOP RECORDER INSERTION N/A 01/14/2017   Procedure: LOOP RECORDER INSERTION;  Surgeon: [name], MD;  Location: [place] CV LAB;  Service: Cardiovascular;  Laterality: N/A;   staples to head (posterior Right) Right 12/2016   fell and hit back of head    TONSILLECTOMY     VASECTOMY      Home Medications:  Allergies as of 06/04/2021   No Known Allergies      Medication List        Accurate as of Jun 04, 2021  8:42 AM. If you have any questions, ask  your nurse or doctor.          STOP taking these medications    ondansetron 4 MG tablet Commonly known as: ZOFRAN Stopped by: [name], MD       TAKE these medications    albuterol 108 (90 Base) MCG/ACT inhaler Commonly known as: VENTOLIN HFA Inhale 2 puffs into the lungs every 6 (six) hours as needed for wheezing or shortness of breath.   amLODipine 5 MG tablet Commonly known as: NORVASC Take 5 mg by mouth daily.   aspirin EC 81 MG tablet Take 81 mg by mouth every evening.   cetirizine 10 MG tablet Commonly known as: ZYRTEC Take 10 mg by mouth daily.   finasteride 5 MG tablet Commonly known as: PROSCAR Take 1 tablet by mouth once daily   lovastatin 40 MG tablet Commonly known as: MEVACOR SMARTSIG:1 Tablet(s) By Mouth Every Evening   LUBRICATING EYE DROPS OP Place 1 drop into both eyes daily as needed (dry eyes).   Multi-Vitamins Tabs Take 0.5 tablets by mouth 2 (two) times daily.        Allergies:  No Known Allergies  Family History: Family History  Problem Relation Age of Onset   Nephrotic syndrome Son    CAD Mother    Diabetes Mother    Throat cancer Father    Prostate cancer Neg Hx    Kidney cancer Neg Hx    Bladder Cancer Neg Hx     Social History:  reports that he has never smoked. His smokeless tobacco use includes chew. He reports current alcohol use of about 1.0 standard drink per week. He reports that he does not use drugs.   Physical Exam: BP (!) 149/52   Pulse 66   Ht '5\' 9"'$  (1.753 m)   Wt 199 lb (90.3 kg)   BMI 29.39 kg/m   Constitutional:  Alert and oriented, No acute distress. HEENT: [place] AT, moist mucus membranes.  Trachea midline, no masses. Cardiovascular: No clubbing, cyanosis, or edema. Respiratory: Normal respiratory effort, no increased work of breathing. Skin: No rashes, bruises or suspicious lesions. Neurologic: Grossly intact, no focal deficits, moving all 4 extremities. Psychiatric: Normal mood and  affect.  Laboratory Data: Lab Results  Component Value Date   CREATININE 0.81 12/31/2016   Urinalysis 11-30 RBCs per high-powered field, otherwise unremarkable  Pertinent Imaging:  Results for orders placed or performed in visit on 06/04/21  Bladder Scan (Post Void Residual) in office  Result Value Ref Range   Scan Result 0     Assessment & Plan:   rUTIs -No evidence of ongoing urinary tract infection, currently asymptomatic -Discussed addition of cranberry tablets to regimen versus placing him back on flomax.  - Feels doing okay without resuming flomax he would like to try cranberry tablets.   2. Microscopic hematuria  - UA shows hematuria today, moderate - Recent cysto was reassuing  - Will defer further workup until next cysto but this is likelly prostate related (this fall) - also recently had upper tract imaging in the form of renal ultrasound which was unremarkable  3. BPH -Improvement on finasteride. He is emptying adequately with a PVR of 0 ml.  - Discussed that this may be causing his infections recommended restarting flomax. He declined restarting this medication  - Declined outlet procedure  Follow-up cystoscopy was scheduled  I,Kailey Littlejohn,acting as a scribe for [name], MD.,have documented all relevant documentation on the behalf of [name], MD,as directed by  [name], MD while in the presence of [name], MD.  I have reviewed the above documentation for accuracy and completeness, and I agree with the above.   [name], MD   [facility] [address], [place], [place] [address] [phone]

## 2021-06-03 NOTE — Progress Notes (Incomplete)
06/04/21 12:00 AM   [place] [birth year] [id number]  Referring provider:  Ezequiel Kayser, MD [address] [place],  [place] [address] No chief complaint on file.   HPI: [name] is a 71 y.o.male with a personal history of bladder cancer, hematuria, BPH with LUTS, and dysuria, who presents today for a 1 month follow-up with UA, IPSS, and PVR.  He was seen on 04/18/2021 by Debroah Loop, PA-C,  for further evaluation of dysuria. UA had 2+ blood, nitrites, and 1+ leukocyte esterase; urine microscopy with 3-10 RBCs/HPF, amorphous crystals, and many bacteria. Urine culture staphylococcus epidermidis.         Score:  1-7 Mild 8-19 Moderate 20-35 Severe     PMH: Past Medical History:  Diagnosis Date  . Atrial fibrillation ([place])   . BPH (benign prostatic hyperplasia)   . CAD (coronary artery disease)    chronically occluded RCA  . Cancer ([facility])    TRANSITIONAL CELL BLADDER CANCER  . Dysrhythmia    a fib/flutter  . ED (erectile dysfunction)   . History of kidney stones   . Hypercholesterolemia   . Hypertension   . RCA occlusion ([place])   . Sleep apnea   . Testicle pain   . Tremor   . Vasovagal syncope     Surgical History: Past Surgical History:  Procedure Laterality Date  . APPENDECTOMY    . CARDIAC CATHETERIZATION    . COLONOSCOPY    . COLONOSCOPY WITH PROPOFOL N/A 08/05/2019   Procedure: COLONOSCOPY WITH PROPOFOL;  Surgeon: [name], MD;  Location: [facility] ENDOSCOPY;  Service: Endoscopy;  Laterality: N/A;  . CYSTOSCOPY WITH BIOPSY N/A 01/02/2016   Procedure: CYSTOSCOPY WITH BIOPSY;  Surgeon: [name], MD;  Location: [facility] ORS;  Service: Urology;  Laterality: N/A;  . FACIAL RECONSTRUCTION SURGERY    . heart ablation    . HERNIA REPAIR    . LOOP RECORDER INSERTION N/A 01/14/2017   Procedure: LOOP RECORDER INSERTION;  Surgeon: [name], MD;  Location: [place] CV LAB;  Service: Cardiovascular;  Laterality:  N/A;  . staples to head (posterior Right) Right 12/2016   fell and hit back of head   . TONSILLECTOMY    . VASECTOMY      Home Medications:  Allergies as of 06/04/2021   No Known Allergies      Medication List        Accurate as of Jun 04, 2021 12:00 AM. If you have any questions, ask your nurse or doctor.          albuterol 108 (90 Base) MCG/ACT inhaler Commonly known as: VENTOLIN HFA Inhale 2 puffs into the lungs every 6 (six) hours as needed for wheezing or shortness of breath.   amLODipine 5 MG tablet Commonly known as: NORVASC Take 5 mg by mouth daily.   aspirin EC 81 MG tablet Take 81 mg by mouth every evening.   cetirizine 10 MG tablet Commonly known as: ZYRTEC Take 10 mg by mouth daily.   finasteride 5 MG tablet Commonly known as: PROSCAR Take 1 tablet by mouth once daily   lovastatin 40 MG tablet Commonly known as: MEVACOR SMARTSIG:1 Tablet(s) By Mouth Every Evening   LUBRICATING EYE DROPS OP Place 1 drop into both eyes daily as needed (dry eyes).   Multi-Vitamins Tabs Take 0.5 tablets by mouth 2 (two) times daily.   ondansetron 4 MG tablet Commonly known as: ZOFRAN Take 2 tablets (8 mg total) by mouth every  4 (four) hours as needed for nausea or vomiting.        Allergies:  No Known Allergies  Family History: Family History  Problem Relation Age of Onset  . Nephrotic syndrome Son   . CAD Mother   . Diabetes Mother   . Throat cancer Father   . Prostate cancer Neg Hx   . Kidney cancer Neg Hx   . Bladder Cancer Neg Hx     Social History:  reports that he has never smoked. His smokeless tobacco use includes chew. He reports current alcohol use of about 1.0 standard drink per week. He reports that he does not use drugs.   Physical Exam: There were no vitals taken for this visit.  Constitutional:  Alert and oriented, No acute distress. HEENT: [place] AT, moist mucus membranes.  Trachea midline, no masses. Cardiovascular: No clubbing,  cyanosis, or edema. Respiratory: Normal respiratory effort, no increased work of breathing. Skin: No rashes, bruises or suspicious lesions. Neurologic: Grossly intact, no focal deficits, moving all 4 extremities. Psychiatric: Normal mood and affect.  Laboratory Data: Lab Results  Component Value Date   CREATININE 0.81 12/31/2016   No results found for: HGBA1C  Urinalysis   Pertinent Imaging: No results found for any visits on 06/04/21.    Assessment & Plan:     No follow-ups on file.  I,Kailey Littlejohn,acting as a Education administrator for [name], MD.,have documented all relevant documentation on the behalf of [name], MD,as directed by  [name], MD while in the presence of [name], [place] [address] [place], [place] [address] [phone]

## 2021-06-04 ENCOUNTER — Ambulatory Visit: Payer: Medicare HMO | Admitting: Urology

## 2021-06-04 ENCOUNTER — Encounter: Payer: Self-pay | Admitting: Urology

## 2021-06-04 VITALS — BP 149/52 | HR 66 | Ht 69.0 in | Wt 199.0 lb

## 2021-06-04 DIAGNOSIS — N138 Other obstructive and reflux uropathy: Secondary | ICD-10-CM

## 2021-06-04 DIAGNOSIS — R3129 Other microscopic hematuria: Secondary | ICD-10-CM

## 2021-06-04 DIAGNOSIS — Z87440 Personal history of urinary (tract) infections: Secondary | ICD-10-CM | POA: Diagnosis not present

## 2021-06-04 DIAGNOSIS — N401 Enlarged prostate with lower urinary tract symptoms: Secondary | ICD-10-CM | POA: Diagnosis not present

## 2021-06-04 LAB — MICROSCOPIC EXAMINATION: Bacteria, UA: NONE SEEN

## 2021-06-04 LAB — URINALYSIS, COMPLETE
Bilirubin, UA: NEGATIVE
Glucose, UA: NEGATIVE
Ketones, UA: NEGATIVE
Nitrite, UA: NEGATIVE
Protein,UA: NEGATIVE
Specific Gravity, UA: 1.025 (ref 1.005–1.030)
Urobilinogen, Ur: 0.2 mg/dL (ref 0.2–1.0)
pH, UA: 6.5 (ref 5.0–7.5)

## 2021-06-04 LAB — BLADDER SCAN AMB NON-IMAGING: Scan Result: 0

## 2021-10-31 ENCOUNTER — Other Ambulatory Visit: Payer: Self-pay | Admitting: *Deleted

## 2021-10-31 DIAGNOSIS — N401 Enlarged prostate with lower urinary tract symptoms: Secondary | ICD-10-CM

## 2021-11-01 ENCOUNTER — Other Ambulatory Visit: Payer: Medicare HMO

## 2021-11-01 DIAGNOSIS — N138 Other obstructive and reflux uropathy: Secondary | ICD-10-CM

## 2021-11-02 LAB — PSA: Prostate Specific Ag, Serum: 0.9 ng/mL (ref 0.0–4.0)

## 2021-11-05 ENCOUNTER — Encounter: Payer: Self-pay | Admitting: Urology

## 2021-11-05 ENCOUNTER — Telehealth: Payer: Self-pay | Admitting: Urology

## 2021-11-05 ENCOUNTER — Ambulatory Visit (INDEPENDENT_AMBULATORY_CARE_PROVIDER_SITE_OTHER): Payer: Medicare HMO | Admitting: Urology

## 2021-11-05 VITALS — BP 160/87 | HR 59 | Ht 69.0 in | Wt 204.0 lb

## 2021-11-05 DIAGNOSIS — R3129 Other microscopic hematuria: Secondary | ICD-10-CM

## 2021-11-05 DIAGNOSIS — Z8551 Personal history of malignant neoplasm of bladder: Secondary | ICD-10-CM

## 2021-11-05 LAB — MICROSCOPIC EXAMINATION: RBC, Urine: >30 /hpf — AB (ref 0–2)

## 2021-11-05 LAB — URINALYSIS, COMPLETE
Bilirubin, UA: NEGATIVE
Glucose, UA: NEGATIVE
Ketones, UA: NEGATIVE
Leukocytes,UA: NEGATIVE
Nitrite, UA: NEGATIVE
Specific Gravity, UA: 1.02 (ref 1.005–1.030)
Urobilinogen, Ur: 1 mg/dL (ref 0.2–1.0)
pH, UA: 6.5 (ref 5.0–7.5)

## 2021-11-05 NOTE — Telephone Encounter (Signed)
Please let this patient know that after he left, I did get the final report for his urinalysis and there are significantly more blood this year than previously.  In light of this, I think it is prudent to proceed with CT urogram especially in light of the issue that he described earlier this week.  Order has been placed, we can call him with the result.

## 2021-11-05 NOTE — Progress Notes (Signed)
   10/30/2020   CC:  Chief Complaint  Patient presents with   Cysto    HPI: [name] is a 71 y.o. 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  CT urogram which showed a 3 mm left lower pole stone on 12/18/15, enlarged prostate with diffuse bladder wall thickening.  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   He is on finasteride for BPH.  He recently resumed this last year and his PSA is trending back downwards now at 0.9.  Renal ultrasound did show bilateral renal cyst but otherwise negative on 11/2020.  Observe urinary symptoms, he has been doing well.  He feels like he had something break loose the other day, was having some obstructive urinary symptoms and then suddenly has been voiding without difficulty and emptying well.   Vitals:   11/05/21 0901  BP: (!) 160/87  Pulse: (!) 59   NED. A&Ox3.   No respiratory distress   Abd soft, NT, ND Normal phallus with bilateral descended testicles  Cystoscopy Procedure Note  Patient identification was confirmed, informed consent was obtained, and patient was prepped using Betadine solution.  Lidocaine jelly was administered per urethral meatus.     Pre-Procedure: - Inspection reveals a normal caliber ureteral meatus.  Procedure: The flexible cystoscope was introduced without difficulty - No urethral strictures/lesions are present. 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

## 2021-11-05 NOTE — Telephone Encounter (Signed)
Informed patient of message.  Phone number given for scheduling.

## 2021-11-15 ENCOUNTER — Ambulatory Visit
Admission: RE | Admit: 2021-11-15 | Discharge: 2021-11-15 | Disposition: A | Discharge status: Home / Self Care | Payer: Medicare HMO | Source: Ambulatory Visit | Attending: Urology | Admitting: Urology

## 2021-11-15 DIAGNOSIS — Z8551 Personal history of malignant neoplasm of bladder: Secondary | ICD-10-CM | POA: Diagnosis present

## 2021-11-15 DIAGNOSIS — R3129 Other microscopic hematuria: Secondary | ICD-10-CM | POA: Diagnosis present

## 2021-11-15 LAB — POCT I-STAT CREATININE: Creatinine, Ser: 1 mg/dL (ref 0.61–1.24)

## 2021-11-15 MED ORDER — IOHEXOL 300 MG/ML  SOLN
100.0000 mL | Freq: Once | INTRAMUSCULAR | Status: AC | PRN
  Administered 2021-11-15: 100 mL via INTRAVENOUS

## 2021-11-20 ENCOUNTER — Telehealth: Payer: Self-pay

## 2021-11-20 NOTE — Telephone Encounter (Signed)
-----   Message from [name], MD sent at 11/18/2021  4:03 PM EST ----- CT urogram is abnormal, there is thickening in the ureter tube of unclear significance.  Please schedule an appoint with me, can be virtual next week to discuss options.  [name], MD

## 2021-11-20 NOTE — Telephone Encounter (Signed)
Left message for patient to call and schedule follow up to discuss results.

## 2021-12-04 ENCOUNTER — Other Ambulatory Visit: Payer: Self-pay | Admitting: Urology

## 2021-12-04 ENCOUNTER — Ambulatory Visit: Payer: Medicare HMO | Admitting: Urology

## 2021-12-04 VITALS — BP 157/81 | HR 55 | Ht 69.0 in | Wt 204.0 lb

## 2021-12-04 DIAGNOSIS — R3129 Other microscopic hematuria: Secondary | ICD-10-CM | POA: Diagnosis not present

## 2021-12-04 DIAGNOSIS — R300 Dysuria: Secondary | ICD-10-CM

## 2021-12-04 DIAGNOSIS — R9341 Abnormal radiologic findings on diagnostic imaging of renal pelvis, ureter, or bladder: Secondary | ICD-10-CM

## 2021-12-04 DIAGNOSIS — Z8551 Personal history of malignant neoplasm of bladder: Secondary | ICD-10-CM

## 2021-12-04 DIAGNOSIS — N138 Other obstructive and reflux uropathy: Secondary | ICD-10-CM

## 2021-12-04 DIAGNOSIS — N401 Enlarged prostate with lower urinary tract symptoms: Secondary | ICD-10-CM | POA: Diagnosis not present

## 2021-12-04 LAB — URINALYSIS, COMPLETE
Bilirubin, UA: NEGATIVE
Glucose, UA: NEGATIVE
Ketones, UA: NEGATIVE
Leukocytes,UA: NEGATIVE
Nitrite, UA: NEGATIVE
Protein,UA: NEGATIVE
Specific Gravity, UA: 1.02 (ref 1.005–1.030)
Urobilinogen, Ur: 0.2 mg/dL (ref 0.2–1.0)
pH, UA: 7 (ref 5.0–7.5)

## 2021-12-04 LAB — MICROSCOPIC EXAMINATION

## 2021-12-04 NOTE — Progress Notes (Signed)
12/04/2021 12:50 PM   [place] Aug 05, 1950 [id number]  Referring provider: Ezequiel Kayser, MD [address] [place],  [place] [address]  Chief Complaint  Patient presents with   Follow-up    Discuss results of urogram ct  results     HPI: 71 year old male with a personal history of microscopic hematuria/ personal history of bladder cancer status post negative cystoscopy who presents today to discuss CT urogram results.  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  He underwent CT urogram as part of his workup for persistent microscopic hematuria especially in the setting of bladder cancer found to have a left distal ureteral abnormality.  The right ureter was poorly opacified.  Prostamegaly and sequela of chronic outlet obstruction were also discussed.  He denies any gross hematuria, flank pain, or any other symptoms today.  His urinary symptoms have stabilized since the time of cystoscopy.   PMH: Past Medical History:  Diagnosis Date   Atrial fibrillation (HCC)    BPH (benign prostatic hyperplasia)    CAD (coronary artery disease)    chronically occluded RCA   Cancer ([place])    TRANSITIONAL CELL BLADDER CANCER   Dysrhythmia    a fib/flutter   ED (erectile dysfunction)    History of kidney stones    Hypercholesterolemia    Hypertension    RCA occlusion ([place])    Sleep apnea    Testicle pain    Tremor    Vasovagal syncope     Surgical History: Past Surgical History:  Procedure Laterality Date   APPENDECTOMY     CARDIAC CATHETERIZATION     COLONOSCOPY     COLONOSCOPY WITH PROPOFOL N/A 08/05/2019   Procedure: COLONOSCOPY WITH PROPOFOL;  Surgeon: [name], MD;  Location: [facility];  Service: Endoscopy;  Laterality: N/A;   CYSTOSCOPY WITH BIOPSY N/A 01/02/2016   Procedure: CYSTOSCOPY WITH BIOPSY;  Surgeon: [name], MD;  Location: [facility] ORS;   Service: Urology;  Laterality: N/A;   FACIAL RECONSTRUCTION SURGERY     heart ablation     HERNIA REPAIR     LOOP RECORDER INSERTION N/A 01/14/2017   Procedure: LOOP RECORDER INSERTION;  Surgeon: [name], MD;  Location: [facility];  Service: Cardiovascular;  Laterality: N/A;   staples to head (posterior Right) Right 12/2016   fell and hit back of head    TONSILLECTOMY     VASECTOMY      Home Medications:  Allergies as of 12/04/2021   No Known Allergies      Medication List        Accurate as of December 04, 2021 12:50 PM. If you have any questions, ask your nurse or doctor.          amLODipine 5 MG tablet Commonly known as: NORVASC Take 5 mg by mouth daily.   aspirin EC 81 MG tablet Take 81 mg by mouth every evening.   cetirizine 10 MG tablet Commonly known as: ZYRTEC Take 10 mg by mouth daily.   finasteride 5 MG tablet Commonly known as: PROSCAR Take 1 tablet by mouth once daily   lovastatin 40 MG tablet Commonly known as: MEVACOR SMARTSIG:1 Tablet(s) By Mouth Every Evening   Multi-Vitamins Tabs Take 0.5 tablets by mouth 2 (two) times daily.        Allergies: No Known Allergies  Family History: Family History  Problem  Relation Age of Onset   Nephrotic syndrome Son    CAD Mother    Diabetes Mother    Throat cancer Father    Prostate cancer Neg Hx    Kidney cancer Neg Hx    Bladder Cancer Neg Hx     Social History:  reports that he has never smoked. His smokeless tobacco use includes chew. He reports current alcohol use of about 1.0 standard drink of alcohol per week. He reports that he does not use drugs.   Physical Exam: BP (!) 157/81   Pulse (!) 55   Ht '5\' 9"'$  (1.753 m)   Wt 204 lb (92.5 kg)   BMI 30.13 kg/m   Constitutional:  Alert and oriented, No acute distress. HEENT: [place] AT, moist mucus membranes.  Trachea midline, no masses. Cardiovascular: No clubbing, cyanosis, or edema. Respiratory: Normal respiratory  effort, no increased work of breathing. Skin: No rashes, bruises or suspicious lesions. Neurologic: Grossly intact, no focal deficits, moving all 4 extremities. Psychiatric: Normal mood and affect.  Laboratory Data: Lab Results  Component Value Date   WBC 6.4 12/31/2016   HGB 13.2 12/31/2016   HCT 38.8 (L) 12/31/2016   MCV 98.7 12/31/2016   PLT 161 12/31/2016    Lab Results  Component Value Date   CREATININE 1.00 11/15/2021     Urinalysis Results for orders placed or performed in visit on 12/04/21  Microscopic Examination   Urine  Result Value Ref Range   WBC, UA 0-5 0 - 5 /hpf   RBC, Urine 11-30 (A) 0 - 2 /hpf   Epithelial Cells (non renal) 0-10 0 - 10 /hpf   Bacteria, UA Few None seen/Few  Urinalysis, Complete  Result Value Ref Range   Specific Gravity, UA 1.020 1.005 - 1.030   pH, UA 7.0 5.0 - 7.5   Color, UA Yellow Yellow   Appearance Ur Clear Clear   Leukocytes,UA Negative Negative   Protein,UA Negative Negative/Trace   Glucose, UA Negative Negative   Ketones, UA Negative Negative   RBC, UA 2+ (A) Negative   Bilirubin, UA Negative Negative   Urobilinogen, Ur 0.2 0.2 - 1.0 mg/dL   Nitrite, UA Negative Negative   Microscopic Examination See below:     Pertinent Imaging:  CT HEMATURIA WORKUP  Narrative CLINICAL DATA:  Hematuria, bladder cancer.  * Tracking Code: BO *  EXAM: CT ABDOMEN AND PELVIS WITHOUT AND WITH CONTRAST  TECHNIQUE: Multidetector CT imaging of the abdomen and pelvis was performed following the standard protocol before and following the bolus administration of intravenous contrast.  RADIATION DOSE REDUCTION: This exam was performed according to the departmental dose-optimization program which includes automated exposure control, adjustment of the mA and/or kV according to patient size and/or use of iterative reconstruction technique.  CONTRAST:  161m OMNIPAQUE IOHEXOL 300 MG/ML  SOLN  COMPARISON:  12/18/2015.  FINDINGS: Lower  chest: No acute findings. Heart is enlarged. Coronary artery calcification. No pericardial or pleural effusion. Distal esophagus is unremarkable.  Hepatobiliary: Subcentimeter low-attenuation lesion in the right hepatic lobe, too small to characterize. Liver and gallbladder are otherwise unremarkable. No biliary ductal dilatation.  Pancreas: Negative.  Spleen: Negative.  Adrenals/Urinary Tract: Adrenal glands are unremarkable. 1.9 cm cyst in the right kidney (4/37). Additional exophytic cysts in the left kidney, measuring up to 1.7 cm. Subcentimeter lesions in the kidneys are too small to characterize. No specific follow-up necessary. No urinary stones. Majority of the right ureter and mid/distal left ureter are poorly opacified, limiting  evaluation. Otherwise, no filling defects in the opacified portions of the intrarenal collecting systems, ureters or bladder. There does appear to be thickening at the distal left ureter, just proximal to the ureterovesical junction (13/78-79). Bladder wall appears minimally thickened. Small diverticula off the superior anterior bladder, one of which contains calcium, similar. Prostate indents the bladder slightly.  Stomach/Bowel: Stomach, small bowel and colon are unremarkable. Appendix is not visualized.  Vascular/Lymphatic: Atherosclerotic calcification of the aorta. No pathologically enlarged lymph nodes.  Reproductive: Prostate is mildly prominent and indents the bladder.  Other: Small left inguinal hernia contains fat. Small umbilical hernia contains fat. No free fluid. Mesenteries and peritoneum are unremarkable.  Musculoskeletal: Degenerative changes in the spine. No worrisome lytic or sclerotic lesions.  IMPRESSION: 1. Focal thickening of the distal ureter, just proximal to the ureterovesical junction. Urothelial carcinoma cannot be excluded. Majority of the right ureter and mid/distal left ureter are poorly opacified, limiting  additional evaluation. 2. Slight bladder wall thickening and diverticula, suggesting an element of outlet obstruction related to a mildly prominent prostate. 3. Aortic atherosclerosis (ICD10-I70.0). Coronary artery calcification.   Electronically Signed By: Lorin Picket M.D. On: 11/18/2021 11:02  CT scan was personally reviewed, agree with radiologic interpretation.   Assessment & Plan:    1. Microscopic hematuria Cystoscopy was negative, upper tract imaging was abnormal, see below - Urinalysis, Complete - CULTURE, URINE COMPREHENSIVE - Cytology - Non PAP;  2. History of bladder cancer Risk factor for urothelial malignancy recurrence - Urinalysis, Complete - CULTURE, URINE COMPREHENSIVE - Cytology - Non PAP;  3.  Left ureteral abnormality In light of left distal ureteral abnormality and history is a land above along with persistent microscopic hematuria, I recommended proceeding to the operating room for bilateral retrograde pyelogram, diagnostic left ureteroscopy, possible left ureteral biopsy and or laser fulguration as well as ureteral stent placement.  We did discuss the differential diagnosis which could include inflammatory versus irritative versus infectious versus malignancy.  We discussed the risk and benefits of surgery including risk of bleeding, infection, demonstrating structures, ureteral injury and or perforation, need for ureteral stent and ureteral irritation, need for further procedures amongst others.  All questions were answered.  [name], MD  [facility] [address], [place], [place] [address] [phone]

## 2021-12-04 NOTE — Progress Notes (Signed)
Surgical Physician Order Form [facility]  * Scheduling expectation : Next Available  *Length of Case:   *Clearance needed: no  *Anticoagulation Instructions: Hold all anticoagulants  *Aspirin Instructions: Ok to continue Aspirin  *Post-op visit Date/Instructions:   TBD  *Diagnosis:  Hematuria/left distal ureteral abnormality/history of bladder cancer  *Procedure: Bilateral retrograde pyelogram, diagnostic left ureteroscopy with possible ureteral biopsy, possible laser ablation of tumor, possible left ureteral stent placement   Additional orders: N/A  -Admit type: OUTpatient  -Anesthesia: General  -VTE Prophylaxis Standing Order SCD's       Other:   -Standing Lab Orders Per Anesthesia    Lab other: None  -Standing Test orders EKG/Chest x-ray per Anesthesia       Test other:   - Medications:  Ancef 2gm IV  -Other orders:   Patient will change insurance January 1, please be aware.

## 2021-12-06 LAB — CYTOLOGY - NON PAP

## 2021-12-07 LAB — CULTURE, URINE COMPREHENSIVE

## 2021-12-09 ENCOUNTER — Other Ambulatory Visit: Payer: Self-pay | Admitting: Neurology

## 2021-12-09 DIAGNOSIS — G250 Essential tremor: Secondary | ICD-10-CM

## 2021-12-10 ENCOUNTER — Telehealth: Payer: Self-pay

## 2021-12-10 NOTE — Telephone Encounter (Signed)
I spoke with Mr. [name]. We have discussed possible surgery dates and Monday December 11th, 2023 was agreed upon by all parties. Patient given information about surgery date, what to expect pre-operatively and post operatively.  We discussed that a Pre-Admission Testing office will be calling to set up the pre-op visit that will take place prior to surgery, and that these appointments are typically done over the phone with a Pre-Admissions RN. Informed patient that our office will communicate any additional care to be provided after surgery. Patients questions or concerns were discussed during our call. Advised to call our office should there be any additional information, questions or concerns that arise. Patient verbalized understanding.

## 2021-12-10 NOTE — Progress Notes (Signed)
   [facility]  Surgery Date: Date: 12/23/2021  Surgeon: Dr. [name], MD  Inpt ( No  )   Outpt (Yes)   Obs ( No  )   Diagnosis: R31.29 Hematuria, R93.41 Ureteral Abnormality, Z85.51 History of Bladder Cancer  -CPT: 41991, 52351, 44458, 48350  Surgery: Bilateral Retrograde Pyelograms, Diagnostic Left Ureteroscopy with possible Ureteral Biopsy, Possible Laser Ablation of Tumor, Possible Left Ureteral Stent Placement   Stop Anticoagulations: Yes, may continue ASA  Cardiac/Medical/Pulmonary Clearance needed: no  *Orders entered into EPIC  Date: 12/10/21   *Case booked in [place]  Date: 12/10/21  *Notified pt of Surgery: Date: 12/10/21  PRE-OP UA & CX: no  *Placed into Prior Authorization Work [place] Date: 12/10/21  Assistant/laser/rep:No

## 2021-12-19 ENCOUNTER — Inpatient Hospital Stay: Admission: RE | Admit: 2021-12-19 | Payer: Medicare HMO | Source: Ambulatory Visit

## 2022-01-14 ENCOUNTER — Encounter
Admission: RE | Admit: 2022-01-14 | Discharge: 2022-01-14 | Disposition: A | Discharge status: Home / Self Care | Payer: BLUE CROSS/BLUE SHIELD | Source: Ambulatory Visit | Attending: Urology | Admitting: Urology

## 2022-01-14 VITALS — Ht 69.0 in | Wt 205.0 lb

## 2022-01-14 DIAGNOSIS — I4891 Unspecified atrial fibrillation: Secondary | ICD-10-CM

## 2022-01-14 DIAGNOSIS — Z01812 Encounter for preprocedural laboratory examination: Secondary | ICD-10-CM

## 2022-01-14 DIAGNOSIS — I240 Acute coronary thrombosis not resulting in myocardial infarction: Secondary | ICD-10-CM

## 2022-01-14 HISTORY — DX: Personal history of other diseases of the digestive system: Z87.19

## 2022-01-14 HISTORY — DX: Acute coronary thrombosis not resulting in myocardial infarction: I24.0

## 2022-01-14 NOTE — Patient Instructions (Addendum)
Your procedure is scheduled on: Monday, January 8 Report to the Registration Desk on the 1st floor of the [facility]. To find out your arrival time, please call [phone] between 1PM - 3PM on: Friday, January 5 If your arrival time is 6:00 am, do not arrive prior to that time as the [facility] entrance doors do not open until 6:00 am.  REMEMBER: Instructions that are not followed completely may result in serious medical risk, up to and including death; or upon the discretion of your surgeon and anesthesiologist your surgery may need to be rescheduled.  Do not eat or drink after midnight the night before surgery.  No gum chewing, lozengers or hard candies.  TAKE THESE MEDICATIONS THE MORNING OF SURGERY WITH A SIP OF WATER:  Amlodipine  Per Dr. Cherrie Gauze note; ok to continue aspirin.  One week prior to surgery: starting today, January 2 Stop Anti-inflammatories (NSAIDS) such as Advil, Aleve, Ibuprofen, Motrin, Naproxen, Naprosyn and Aspirin based products such as Excedrin, Goodys Powder, BC Powder. Stop ANY OVER THE COUNTER supplements until after surgery. Stop multiple vitamin. You may however, continue to take Tylenol if needed for pain up until the day of surgery.  No Alcohol for 24 hours before or after surgery.  No Smoking including e-cigarettes for 24 hours prior to surgery.  No chewable tobacco products for at least 6 hours prior to surgery.  No nicotine patches on the day of surgery.  Do not use any "recreational" drugs for at least a week prior to your surgery.  Please be advised that the combination of cocaine and anesthesia may have negative outcomes, up to and including death. If you test positive for cocaine, your surgery will be cancelled.  On the morning of surgery brush your teeth with toothpaste and water, you may rinse your mouth with mouthwash if you wish. Do not swallow any toothpaste or mouthwash.  Do not wear jewelry, make-up, hairpins, clips or nail  polish.  Do not wear lotions, powders, or perfumes.   Do not shave body from the neck down 48 hours prior to surgery just in case you cut yourself which could leave a site for infection.  Also, freshly shaved skin may become irritated if using the CHG soap.  Contact lenses, hearing aids and dentures may not be worn into surgery.  Do not bring valuables to the hospital. [facility] is not responsible for any missing/lost belongings or valuables.   Bring your C-PAP to the hospital with you in case you may have to spend the night.   Notify your doctor if there is any change in your medical condition (cold, fever, infection).  Wear comfortable clothing (specific to your surgery type) to the hospital.  After surgery, you can help prevent lung complications by doing breathing exercises.  Take deep breaths and cough every 1-2 hours. Your doctor may order a device called an Incentive Spirometer to help you take deep breaths.  If you are being discharged the day of surgery, you will not be allowed to drive home. You will need a responsible adult (18 years or older) to drive you home and stay with you that night.   If you are taking public transportation, you will need to have a responsible adult (18 years or older) with you. Please confirm with your physician that it is acceptable to use public transportation.   Please call the [facility] at [phone] if you have any questions about these instructions.  Surgery Visitation Policy:  Patients undergoing a surgery or procedure may have two family members or support persons with them as long as the person is not COVID-19 positive or experiencing its symptoms.

## 2022-01-15 ENCOUNTER — Ambulatory Visit: Admission: RE | Admit: 2022-01-15 | Payer: Medicare HMO | Source: Ambulatory Visit

## 2022-01-15 ENCOUNTER — Encounter
Admission: RE | Admit: 2022-01-15 | Discharge: 2022-01-15 | Disposition: A | Discharge status: Home / Self Care | Payer: Medicare Other | Source: Ambulatory Visit | Attending: Urology | Admitting: Urology

## 2022-01-15 DIAGNOSIS — Z01810 Encounter for preprocedural cardiovascular examination: Secondary | ICD-10-CM | POA: Insufficient documentation

## 2022-01-15 DIAGNOSIS — I4891 Unspecified atrial fibrillation: Secondary | ICD-10-CM | POA: Diagnosis not present

## 2022-01-15 DIAGNOSIS — Z01812 Encounter for preprocedural laboratory examination: Secondary | ICD-10-CM

## 2022-01-15 DIAGNOSIS — I240 Acute coronary thrombosis not resulting in myocardial infarction: Secondary | ICD-10-CM | POA: Diagnosis not present

## 2022-01-16 NOTE — Pre-Procedure Instructions (Signed)
Received Cardiac clearance from Clabe Seal PA and placed on chart -Pt low risk

## 2022-01-19 MED ORDER — CHLORHEXIDINE GLUCONATE 0.12 % MT SOLN: 15.0000 mL | Freq: Once | OROMUCOSAL | Status: AC

## 2022-01-19 MED ORDER — ORAL CARE MOUTH RINSE: 15.0000 mL | Freq: Once | OROMUCOSAL | Status: AC

## 2022-01-19 MED ORDER — FAMOTIDINE 20 MG PO TABS: 20.0000 mg | ORAL_TABLET | Freq: Once | ORAL | Status: AC

## 2022-01-19 MED ORDER — LACTATED RINGERS IV SOLN: INTRAVENOUS | Status: DC

## 2022-01-19 MED ORDER — CEFAZOLIN SODIUM-DEXTROSE 2-4 GM/100ML-% IV SOLN
2.0000 g | INTRAVENOUS | Status: AC
  Administered 2022-01-20: 2 g via INTRAVENOUS

## 2022-01-20 ENCOUNTER — Ambulatory Visit: Payer: Medicare Other | Admitting: Anesthesiology

## 2022-01-20 ENCOUNTER — Ambulatory Visit: Payer: Medicare Other

## 2022-01-20 ENCOUNTER — Ambulatory Visit
Admission: RE | Admit: 2022-01-20 | Discharge: 2022-01-20 | Disposition: A | Discharge status: Home / Self Care | Payer: Medicare Other | Attending: Urology | Admitting: Urology

## 2022-01-20 ENCOUNTER — Encounter: Payer: Self-pay | Admitting: Urology

## 2022-01-20 ENCOUNTER — Encounter
Admission: RE | Disposition: A | Discharge status: Home / Self Care | Payer: Self-pay | Source: Home / Self Care | Attending: Urology

## 2022-01-20 DIAGNOSIS — G4730 Sleep apnea, unspecified: Secondary | ICD-10-CM | POA: Diagnosis not present

## 2022-01-20 DIAGNOSIS — Z9049 Acquired absence of other specified parts of digestive tract: Secondary | ICD-10-CM | POA: Diagnosis not present

## 2022-01-20 DIAGNOSIS — I10 Essential (primary) hypertension: Secondary | ICD-10-CM | POA: Diagnosis not present

## 2022-01-20 DIAGNOSIS — I700 Atherosclerosis of aorta: Secondary | ICD-10-CM | POA: Diagnosis not present

## 2022-01-20 DIAGNOSIS — I4891 Unspecified atrial fibrillation: Secondary | ICD-10-CM | POA: Insufficient documentation

## 2022-01-20 DIAGNOSIS — I2510 Atherosclerotic heart disease of native coronary artery without angina pectoris: Secondary | ICD-10-CM | POA: Diagnosis not present

## 2022-01-20 DIAGNOSIS — R3129 Other microscopic hematuria: Secondary | ICD-10-CM | POA: Diagnosis not present

## 2022-01-20 DIAGNOSIS — R9341 Abnormal radiologic findings on diagnostic imaging of renal pelvis, ureter, or bladder: Secondary | ICD-10-CM | POA: Diagnosis not present

## 2022-01-20 DIAGNOSIS — Z8551 Personal history of malignant neoplasm of bladder: Secondary | ICD-10-CM

## 2022-01-20 DIAGNOSIS — C662 Malignant neoplasm of left ureter: Secondary | ICD-10-CM | POA: Diagnosis not present

## 2022-01-20 HISTORY — PX: URETERAL BIOPSY: SHX6688

## 2022-01-20 HISTORY — PX: URETEROSCOPY: SHX842

## 2022-01-20 HISTORY — PX: CYSTOSCOPY W/ RETROGRADES: SHX1426

## 2022-01-20 HISTORY — PX: CYSTOSCOPY/URETEROSCOPY/HOLMIUM LASER: SHX6545

## 2022-01-20 HISTORY — PX: CYSTOSCOPY WITH STENT PLACEMENT: SHX5790

## 2022-01-20 SURGERY — CYSTOSCOPY, WITH RETROGRADE PYELOGRAM
Anesthesia: General | Laterality: Left

## 2022-01-20 MED ORDER — FENTANYL CITRATE (PF) 100 MCG/2ML IJ SOLN
INTRAMUSCULAR | Status: AC
  Filled 2022-01-20: qty 2

## 2022-01-20 MED ORDER — OXYBUTYNIN CHLORIDE 5 MG PO TABS: 5.0000 mg | ORAL_TABLET | Freq: Three times a day (TID) | ORAL | 0 refills | Status: DC | PRN

## 2022-01-20 MED ORDER — PROPOFOL 10 MG/ML IV BOLUS
INTRAVENOUS | Status: DC | PRN
  Administered 2022-01-20: 120 mg via INTRAVENOUS

## 2022-01-20 MED ORDER — LIDOCAINE HCL (PF) 2 % IJ SOLN
INTRAMUSCULAR | Status: AC
  Filled 2022-01-20: qty 5

## 2022-01-20 MED ORDER — PROPOFOL 10 MG/ML IV BOLUS
INTRAVENOUS | Status: AC
  Filled 2022-01-20: qty 20

## 2022-01-20 MED ORDER — EPHEDRINE SULFATE (PRESSORS) 50 MG/ML IJ SOLN
INTRAMUSCULAR | Status: DC | PRN
  Administered 2022-01-20: 5 mg via INTRAVENOUS

## 2022-01-20 MED ORDER — FENTANYL CITRATE (PF) 100 MCG/2ML IJ SOLN: 25.0000 ug | INTRAMUSCULAR | Status: DC | PRN

## 2022-01-20 MED ORDER — CEFAZOLIN SODIUM-DEXTROSE 2-4 GM/100ML-% IV SOLN
INTRAVENOUS | Status: AC
  Filled 2022-01-20: qty 100

## 2022-01-20 MED ORDER — FENTANYL CITRATE (PF) 100 MCG/2ML IJ SOLN
INTRAMUSCULAR | Status: DC | PRN
  Administered 2022-01-20: 50 ug via INTRAVENOUS

## 2022-01-20 MED ORDER — HYDROCODONE-ACETAMINOPHEN 5-325 MG PO TABS: 1.0000 | ORAL_TABLET | Freq: Four times a day (QID) | ORAL | 0 refills | Status: DC | PRN

## 2022-01-20 MED ORDER — TAMSULOSIN HCL 0.4 MG PO CAPS: 0.4000 mg | ORAL_CAPSULE | Freq: Every day | ORAL | 0 refills | Status: DC

## 2022-01-20 MED ORDER — ROCURONIUM BROMIDE 100 MG/10ML IV SOLN
INTRAVENOUS | Status: DC | PRN
  Administered 2022-01-20: 50 mg via INTRAVENOUS

## 2022-01-20 MED ORDER — LIDOCAINE HCL (CARDIAC) PF 100 MG/5ML IV SOSY
PREFILLED_SYRINGE | INTRAVENOUS | Status: DC | PRN
  Administered 2022-01-20: 100 mg via INTRAVENOUS

## 2022-01-20 MED ORDER — SUGAMMADEX SODIUM 200 MG/2ML IV SOLN
INTRAVENOUS | Status: DC | PRN
  Administered 2022-01-20: 200 mg via INTRAVENOUS

## 2022-01-20 MED ORDER — CHLORHEXIDINE GLUCONATE 0.12 % MT SOLN
OROMUCOSAL | Status: AC
  Administered 2022-01-20: 15 mL via OROMUCOSAL
  Filled 2022-01-20: qty 15

## 2022-01-20 MED ORDER — OXYCODONE HCL 5 MG PO TABS
5.0000 mg | ORAL_TABLET | Freq: Once | ORAL | Status: AC | PRN
  Administered 2022-01-20: 5 mg via ORAL

## 2022-01-20 MED ORDER — ONDANSETRON HCL 4 MG/2ML IJ SOLN
INTRAMUSCULAR | Status: AC
  Filled 2022-01-20: qty 2

## 2022-01-20 MED ORDER — FAMOTIDINE 20 MG PO TABS
ORAL_TABLET | ORAL | Status: AC
  Administered 2022-01-20: 20 mg via ORAL
  Filled 2022-01-20: qty 1

## 2022-01-20 MED ORDER — ONDANSETRON HCL 4 MG/2ML IJ SOLN
INTRAMUSCULAR | Status: DC | PRN
  Administered 2022-01-20: 4 mg via INTRAVENOUS

## 2022-01-20 MED ORDER — OXYCODONE HCL 5 MG/5ML PO SOLN: 5.0000 mg | Freq: Once | ORAL | Status: AC | PRN

## 2022-01-20 MED ORDER — IOHEXOL 180 MG/ML  SOLN
INTRAMUSCULAR | Status: DC | PRN
  Administered 2022-01-20: 20 mL

## 2022-01-20 MED ORDER — OXYCODONE HCL 5 MG PO TABS
ORAL_TABLET | ORAL | Status: AC
  Filled 2022-01-20: qty 1

## 2022-01-20 SURGICAL SUPPLY — 36 items
BAG DRAIN SIEMENS DORNER NS (MISCELLANEOUS) ×2 IMPLANT
BAG PRESSURE INF REUSE 3000 (BAG) ×2 IMPLANT
BRUSH SCRUB EZ  4% CHG (MISCELLANEOUS) ×2
BRUSH SCRUB EZ 1% IODOPHOR (MISCELLANEOUS) ×2 IMPLANT
BRUSH SCRUB EZ 4% CHG (MISCELLANEOUS) ×2 IMPLANT
CATH URET FLEX-TIP 2 LUMEN 10F (CATHETERS) ×2 IMPLANT
CATH URETL OPEN 5X70 (CATHETERS) ×2 IMPLANT
CNTNR SPEC 2.5X3XGRAD LEK (MISCELLANEOUS) ×2
CONRAY 43 FOR UROLOGY 50M (MISCELLANEOUS) ×2 IMPLANT
CONT SPEC 4OZ STRL OR WHT (MISCELLANEOUS) ×2
CONTAINER SPEC 2.5X3XGRAD LEK (MISCELLANEOUS) ×2 IMPLANT
DRAPE UTILITY 15X26 TOWEL STRL (DRAPES) ×2 IMPLANT
DRSG TELFA 3X4 N-ADH STERILE (GAUZE/BANDAGES/DRESSINGS) ×2 IMPLANT
FORCEPS BIOP PIRANHA Y (CUTTING FORCEPS) IMPLANT
GAUZE 4X4 16PLY ~~LOC~~+RFID DBL (SPONGE) ×4 IMPLANT
GLOVE BIO SURGEON STRL SZ 6.5 (GLOVE) ×2 IMPLANT
GOWN STRL REUS W/ TWL LRG LVL3 (GOWN DISPOSABLE) ×4 IMPLANT
GOWN STRL REUS W/TWL LRG LVL3 (GOWN DISPOSABLE) ×4
GUIDEWIRE GREEN .038 145CM (MISCELLANEOUS) ×2 IMPLANT
GUIDEWIRE STR DUAL SENSOR (WIRE) ×4 IMPLANT
IV NS IRRIG 3000ML ARTHROMATIC (IV SOLUTION) ×2 IMPLANT
KIT TURNOVER CYSTO (KITS) ×2 IMPLANT
MANIFOLD NEPTUNE II (INSTRUMENTS) ×2 IMPLANT
NDL SAFETY ECLIP 18X1.5 (MISCELLANEOUS) ×2 IMPLANT
PACK CYSTO AR (MISCELLANEOUS) ×2 IMPLANT
SET CYSTO W/LG BORE CLAMP LF (SET/KITS/TRAYS/PACK) ×2 IMPLANT
SHEATH NAVIGATOR HD 12/14X36 (SHEATH) ×2 IMPLANT
STENT URET 6FRX24 CONTOUR (STENTS) IMPLANT
STENT URET 6FRX26 CONTOUR (STENTS) IMPLANT
SURGILUBE 2OZ TUBE FLIPTOP (MISCELLANEOUS) ×2 IMPLANT
SYR TOOMEY IRRIG 70ML (MISCELLANEOUS)
SYRINGE TOOMEY IRRIG 70ML (MISCELLANEOUS) ×2 IMPLANT
TRAP FLUID SMOKE EVACUATOR (MISCELLANEOUS) ×2 IMPLANT
WATER STERILE IRR 1000ML POUR (IV SOLUTION) ×2 IMPLANT
WATER STERILE IRR 3000ML UROMA (IV SOLUTION) ×2 IMPLANT
WATER STERILE IRR 500ML POUR (IV SOLUTION) ×2 IMPLANT

## 2022-01-20 NOTE — Anesthesia Postprocedure Evaluation (Signed)
Anesthesia Post Note  Patient: [name]  Procedure(s) Performed: CYSTOSCOPY WITH RETROGRADE PYELOGRAM (Bilateral) DIAGNOSTIC URETEROSCOPY (Left) URETERAL BIOPSY (Left) CYSTOSCOPY/URETEROSCOPY/HOLMIUM LASER ABLATION OF TUMOR (Left) CYSTOSCOPY WITH STENT PLACEMENT (Left)  Patient location during evaluation: PACU Anesthesia Type: General Level of consciousness: awake and alert Pain management: pain level controlled Vital Signs Assessment: post-procedure vital signs reviewed and stable Respiratory status: spontaneous breathing, nonlabored ventilation, respiratory function stable and patient connected to nasal cannula oxygen Cardiovascular status: blood pressure returned to baseline and stable Postop Assessment: no apparent nausea or vomiting Anesthetic complications: no   No notable events documented.   Last Vitals:  Vitals:   01/20/22 1113 01/20/22 1121  BP: (!) 140/84 (!) 151/93  Pulse: (!) 52 (!) 52  Resp: 13 20  Temp: 36.4 C (!) 36.1 C  SpO2: 97% 97%    Last Pain:  Vitals:   01/20/22 1121  TempSrc: Temporal  PainSc: 0-No pain                 Precious Haws Cereniti Curb

## 2022-01-20 NOTE — Anesthesia Procedure Notes (Addendum)
Procedure Name: Intubation Date/Time: 01/20/2022 10:06 AM  Performed by: Natasha Mead, CRNAPre-anesthesia Checklist: Patient identified, Emergency Drugs available, Suction available and Patient being monitored Patient Re-evaluated:Patient Re-evaluated prior to induction Oxygen Delivery Method: Circle system utilized Preoxygenation: Pre-oxygenation with 100% oxygen Induction Type: IV induction Ventilation: Mask ventilation without difficulty Laryngoscope Size: Mac and 4 Grade View: Grade I Tube type: Oral Tube size: 7.5 mm Number of attempts: 1 Airway Equipment and Method: Stylet and Oral airway Placement Confirmation: ETT inserted through vocal cords under direct vision, positive ETCO2 and breath sounds checked- equal and bilateral Secured at: 22 cm Tube secured with: Tape Dental Injury: Teeth and Oropharynx as per pre-operative assessment

## 2022-01-20 NOTE — Discharge Instructions (Addendum)
You have a ureteral stent in place.  This is a tube that extends from your kidney to your bladder.  This may cause urinary bleeding, burning with urination, and urinary frequency.  Please call our office or present to the ED if you develop fevers >101 or pain which is not able to be controlled with oral pain medications.  You may be given either Flomax and/ or ditropan to help with bladder spasms and stent pain in addition to pain medications.    Your stent is on a string.  On Thursday morning, you may untape the stent and pull the string until the entire tube is removed.  If you have any difficulties, call our office.  Will be happy to [place] [address], [place], [place] [address] [phone]    AMBULATORY SURGERY  DISCHARGE INSTRUCTIONS   The drugs that you were given will stay in your system until tomorrow so for the next 24 hours you should not:  Drive an automobile Make any legal decisions Drink any alcoholic beverage   You may resume regular meals tomorrow.  Today it is better to start with liquids and gradually work up to solid foods.  You may eat anything you prefer, but it is better to start with liquids, then soup and crackers, and gradually work up to solid foods.   Please notify your doctor immediately if you have any unusual bleeding, trouble breathing, redness and pain at the surgery site, drainage, fever, or pain not relieved by medication.    Additional Instructions:        Please contact your physician with any problems or Same Day Surgery at [phone], Monday through Friday 6 am to 4 pm, or  at [facility] number at [phone].

## 2022-01-20 NOTE — H&P (Signed)
01/20/22  RRR CTAB  No changes  HPI: 72 year old male with a personal history of microscopic hematuria/ personal history of bladder cancer status post negative cystoscopy who presents today to discuss CT urogram results.   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   He underwent CT urogram as part of his workup for persistent microscopic hematuria especially in the setting of bladder cancer found to have a left distal ureteral abnormality.  The right ureter was poorly opacified.  Prostamegaly and sequela of chronic outlet obstruction were also discussed.   He denies any gross hematuria, flank pain, or any other symptoms today.  His urinary symptoms have stabilized since the time of cystoscopy.     PMH:     Past Medical History:  Diagnosis Date   Atrial fibrillation (HCC)     BPH (benign prostatic hyperplasia)     CAD (coronary artery disease)      chronically occluded RCA   Cancer ([place])      TRANSITIONAL CELL BLADDER CANCER   Dysrhythmia      a fib/flutter   ED (erectile dysfunction)     History of kidney stones     Hypercholesterolemia     Hypertension     RCA occlusion ([place])     Sleep apnea     Testicle pain     Tremor     Vasovagal syncope        Surgical History:      Past Surgical History:  Procedure Laterality Date   APPENDECTOMY       CARDIAC CATHETERIZATION       COLONOSCOPY       COLONOSCOPY WITH PROPOFOL N/A 08/05/2019    Procedure: COLONOSCOPY WITH PROPOFOL;  Surgeon: [name], MD;  Location: [facility];  Service: Endoscopy;  Laterality: N/A;   CYSTOSCOPY WITH BIOPSY N/A 01/02/2016    Procedure: CYSTOSCOPY WITH BIOPSY;  Surgeon: [name], MD;  Location: [facility];  Service: Urology;  Laterality: N/A;   FACIAL RECONSTRUCTION SURGERY       heart ablation       HERNIA REPAIR       LOOP RECORDER INSERTION N/A 01/14/2017    Procedure: LOOP RECORDER  INSERTION;  Surgeon: [name], MD;  Location: [facility];  Service: Cardiovascular;  Laterality: N/A;   staples to head (posterior Right) Right 12/2016    fell and hit back of head    TONSILLECTOMY       VASECTOMY          Home Medications:  Allergies as of 12/04/2021   No Known Allergies         Medication List           Accurate as of December 04, 2021 12:50 PM. If you have any questions, ask your nurse or doctor.              amLODipine 5 MG tablet Commonly known as: NORVASC Take 5 mg by mouth daily.    aspirin EC 81 MG tablet Take 81 mg by mouth every evening.    cetirizine 10 MG tablet Commonly known as: ZYRTEC Take 10 mg by mouth daily.    finasteride 5 MG tablet Commonly known as: PROSCAR Take 1 tablet by mouth once daily    lovastatin 40 MG tablet Commonly known as: MEVACOR SMARTSIG:1 Tablet(s) By Mouth Every Evening    Multi-Vitamins  Tabs Take 0.5 tablets by mouth 2 (two) times daily.             Allergies: No Known Allergies   Family History:      Family History  Problem Relation Age of Onset   Nephrotic syndrome Son     CAD Mother     Diabetes Mother     Throat cancer Father     Prostate cancer Neg Hx     Kidney cancer Neg Hx     Bladder Cancer Neg Hx        Social History:  reports that he has never smoked. His smokeless tobacco use includes chew. He reports current alcohol use of about 1.0 standard drink of alcohol per week. He reports that he does not use drugs.     Physical Exam: BP (!) 157/81   Pulse (!) 55   Ht '5\' 9"'$  (1.753 m)   Wt 204 lb (92.5 kg)   BMI 30.13 kg/m   Constitutional:  Alert and oriented, No acute distress. HEENT: [place] AT, moist mucus membranes.  Trachea midline, no masses. Cardiovascular: No clubbing, cyanosis, or edema. Respiratory: Normal respiratory effort, no increased work of breathing. Skin: No rashes, bruises or suspicious lesions. Neurologic: Grossly intact, no focal deficits,  moving all 4 extremities. Psychiatric: Normal mood and affect.   Laboratory Data: Recent Labs       Lab Results  Component Value Date    WBC 6.4 12/31/2016    HGB 13.2 12/31/2016    HCT 38.8 (L) 12/31/2016    MCV 98.7 12/31/2016    PLT 161 12/31/2016        Recent Labs       Lab Results  Component Value Date    CREATININE 1.00 11/15/2021          Urinalysis      Results for orders placed or performed in visit on 12/04/21  Microscopic Examination    Urine  Result Value Ref Range    WBC, UA 0-5 0 - 5 /hpf    RBC, Urine 11-30 (A) 0 - 2 /hpf    Epithelial Cells (non renal) 0-10 0 - 10 /hpf    Bacteria, UA Few None seen/Few  Urinalysis, Complete  Result Value Ref Range    Specific Gravity, UA 1.020 1.005 - 1.030    pH, UA 7.0 5.0 - 7.5    Color, UA Yellow Yellow    Appearance Ur Clear Clear    Leukocytes,UA Negative Negative    Protein,UA Negative Negative/Trace    Glucose, UA Negative Negative    Ketones, UA Negative Negative    RBC, UA 2+ (A) Negative    Bilirubin, UA Negative Negative    Urobilinogen, Ur 0.2 0.2 - 1.0 mg/dL    Nitrite, UA Negative Negative    Microscopic Examination See below:        Pertinent Imaging:   CT HEMATURIA WORKUP   Narrative CLINICAL DATA:  Hematuria, bladder cancer.  * Tracking Code: BO *   EXAM: CT ABDOMEN AND PELVIS WITHOUT AND WITH CONTRAST   TECHNIQUE: Multidetector CT imaging of the abdomen and pelvis was performed following the standard protocol before and following the bolus administration of intravenous contrast.   RADIATION DOSE REDUCTION: This exam was performed according to the departmental dose-optimization program which includes automated exposure control, adjustment of the mA and/or kV according to patient size and/or use of iterative reconstruction technique.   CONTRAST:  130m OMNIPAQUE IOHEXOL 300 MG/ML  SOLN  COMPARISON:  12/18/2015.   FINDINGS: Lower chest: No acute findings. Heart is enlarged.  Coronary artery calcification. No pericardial or pleural effusion. Distal esophagus is unremarkable.   Hepatobiliary: Subcentimeter low-attenuation lesion in the right hepatic lobe, too small to characterize. Liver and gallbladder are otherwise unremarkable. No biliary ductal dilatation.   Pancreas: Negative.   Spleen: Negative.   Adrenals/Urinary Tract: Adrenal glands are unremarkable. 1.9 cm cyst in the right kidney (4/37). Additional exophytic cysts in the left kidney, measuring up to 1.7 cm. Subcentimeter lesions in the kidneys are too small to characterize. No specific follow-up necessary. No urinary stones. Majority of the right ureter and mid/distal left ureter are poorly opacified, limiting evaluation. Otherwise, no filling defects in the opacified portions of the intrarenal collecting systems, ureters or bladder. There does appear to be thickening at the distal left ureter, just proximal to the ureterovesical junction (13/78-79). Bladder wall appears minimally thickened. Small diverticula off the superior anterior bladder, one of which contains calcium, similar. Prostate indents the bladder slightly.   Stomach/Bowel: Stomach, small bowel and colon are unremarkable. Appendix is not visualized.   Vascular/Lymphatic: Atherosclerotic calcification of the aorta. No pathologically enlarged lymph nodes.   Reproductive: Prostate is mildly prominent and indents the bladder.   Other: Small left inguinal hernia contains fat. Small umbilical hernia contains fat. No free fluid. Mesenteries and peritoneum are unremarkable.   Musculoskeletal: Degenerative changes in the spine. No worrisome lytic or sclerotic lesions.   IMPRESSION: 1. Focal thickening of the distal ureter, just proximal to the ureterovesical junction. Urothelial carcinoma cannot be excluded. Majority of the right ureter and mid/distal left ureter are poorly opacified, limiting additional evaluation. 2. Slight  bladder wall thickening and diverticula, suggesting an element of outlet obstruction related to a mildly prominent prostate. 3. Aortic atherosclerosis (ICD10-I70.0). Coronary artery calcification.     Electronically Signed By: Lorin Picket M.D. On: 11/18/2021 11:02   CT scan was personally reviewed, agree with radiologic interpretation.     Assessment & Plan:     1. Microscopic hematuria Cystoscopy was negative, upper tract imaging was abnormal, see below - Urinalysis, Complete - CULTURE, URINE COMPREHENSIVE - Cytology - Non PAP;   2. History of bladder cancer Risk factor for urothelial malignancy recurrence - Urinalysis, Complete - CULTURE, URINE COMPREHENSIVE - Cytology - Non PAP;   3.  Left ureteral abnormality In light of left distal ureteral abnormality and history is a land above along with persistent microscopic hematuria, I recommended proceeding to the operating room for bilateral retrograde pyelogram, diagnostic left ureteroscopy, possible left ureteral biopsy and or laser fulguration as well as ureteral stent placement.  We did discuss the differential diagnosis which could include inflammatory versus irritative versus infectious versus malignancy.   We discussed the risk and benefits of surgery including risk of bleeding, infection, demonstrating structures, ureteral injury and or perforation, need for ureteral stent and ureteral irritation, need for further procedures amongst others.  All questions were answered.   [name], MD   [facility] [address], [place], [place] [address] [phone]

## 2022-01-20 NOTE — Transfer of Care (Signed)
Immediate Anesthesia Transfer of Care Note  Patient: [name]  Procedure(s) Performed: CYSTOSCOPY WITH RETROGRADE PYELOGRAM (Bilateral) DIAGNOSTIC URETEROSCOPY (Left) URETERAL BIOPSY (Left) CYSTOSCOPY/URETEROSCOPY/HOLMIUM LASER ABLATION OF TUMOR (Left) CYSTOSCOPY WITH STENT PLACEMENT (Left)  Patient Location: PACU  Anesthesia Type:General  Level of Consciousness: awake and drowsy  Airway & Oxygen Therapy: Patient Spontanous Breathing and Patient connected to face mask oxygen  Post-op Assessment: Report given to RN and Post -op Vital signs reviewed and stable  Post vital signs: stable  Last Vitals:  Vitals Value Taken Time  BP 139/82 01/20/22 1040  Temp    Pulse 58 01/20/22 1041  Resp 16 01/20/22 1041  SpO2 99 % 01/20/22 1041  Vitals shown include unvalidated device data.  Last Pain:  Vitals:   01/20/22 0916  TempSrc: Temporal  PainSc: 0-No pain      Patients Stated Pain Goal: 0 (21/94/71 2527)  Complications: No notable events documented.

## 2022-01-20 NOTE — Op Note (Signed)
Date of procedure: 01/20/22  Preoperative diagnosis:  Remote history of bladder cancer Microscopic hematuria Abnormal CT urogram  Postoperative diagnosis:  Same as above  Procedure: Cystoscopy Bilateral retrograde pyelogram Diagnostic left ureteroscopy Left ureteral biopsy x 3 Left ureteral stent placement Interpretation of fluoroscopy less than 30 minutes  Surgeon: [name], MD  Anesthesia: General  Complications: None  Intraoperative findings: 1 very subtle pedunculated lesion of the ureter, approximately 1 mm along with some ureteral mucosal folds all of which visually appeared to be benign within left distal ureter.  Biopsied as a precaution.  No papillary tumors or obvious pathology identified.  See pictures.  Bladder unremarkable.  Bilateral retrograde pyelogram unremarkable including previously poorly opacified distal ureters.  EBL: Minimal  Specimens: Left ureteral biopsy  Drains: 6 x 24 French double-J ureteral stent on left with tether  Indication: [name] is a 72 y.o. patient with remote history of bladder cancer in 2017 found of microscopic hematuria and abnormality within the left distal ureter on CT urogram.  After reviewing the management options for treatment, he elected to proceed with the above surgical procedure(s). We have discussed the potential benefits and risks of the procedure, side effects of the proposed treatment, the likelihood of the patient achieving the goals of the procedure, and any potential problems that might occur during the procedure or recuperation. Informed consent has been obtained.  Description of procedure:  The patient was taken to the operating room and general anesthesia was induced.  The patient was placed in the dorsal lithotomy position, prepped and draped in the usual sterile fashion, and preoperative antibiotics were administered. A preoperative time-out was performed.   A 21 French scope was advanced per urethra  into the bladder.  The bladder was carefully inspected and noted to be free of any obvious tumors, masses or lesions.  Attention was turned to the right side through which the right UO was intubated using a 5 [place] open-ended ureteral catheter.  A gentle retrograde pyelogram was performed on the side which showed no hydroureteronephrosis or filling defects.  Attention specifically to the right distal ureter which had been previously poorly opacified on CT urogram appeared completely normal and decompressed.  Next, attention was turned to the left side.  A gentle retrograde pyelogram on the side was also unremarkable without hydroureteronephrosis or filling defects.  The left distal ureter was decompressed and normal.  That being said, due to the thickness seen on previous CT urogram and his history, I did elect to go ahead and perform diagnostic ureteroscopy.  The sensor wire was placed up to the level of the kidney.  A semirigid ureteroscope was then advanced alongside of the wire up to the level beyond the iliacs.  There were a few areas of mucosal folds in 1 area that had a pedunculated 1 mm lesion which visually did not appear to be particularly concerning, possibly a ureteral papilloma versus mucosal fold.  There is also areas adjacent to this with somewhat redundant mucosa.  I did end up electing to biopsy this area as a precaution.  A total of 3 biopsies were taken including the aforementioned pedunculated area.  There is minimal bleeding or trauma noted.  Final retrograde pyelogram showed no contrast extravasation.  As a precaution, I placed a 6 x 24 French double-J ureteral stent fluoroscopically and upon removal of the wire, there was a full coil noted both within the renal pelvis as well as within the bladder.  The stent string was  affixed to the patient's glans using Mastisol and Tegaderm.  He was then cleaned and dried, repositioned in supine position, reversed of anesthesia, and taken to the PACU  in stable condition.  Plan: I will call him with his pathology results as a highly anticipate they will be benign.  Given very small size of the biopsy, they may also be nondiagnostic.  Will consider repeating the CT urogram down the road along with serial urinalyses and cytology as a precaution.  [name], M.D.
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
## 2022-01-21 ENCOUNTER — Encounter: Payer: Self-pay | Admitting: Urology

## 2022-01-21 LAB — SURGICAL PATHOLOGY, GROSS ONLY (NOT ARMC)

## 2022-01-25 ENCOUNTER — Ambulatory Visit
Admission: RE | Admit: 2022-01-25 | Discharge: 2022-01-25 | Disposition: A | Discharge status: Home / Self Care | Payer: Medicare Other | Source: Ambulatory Visit | Attending: Neurology | Admitting: Neurology

## 2022-01-25 DIAGNOSIS — G250 Essential tremor: Secondary | ICD-10-CM | POA: Insufficient documentation

## 2022-01-28 ENCOUNTER — Ambulatory Visit: Payer: Medicare Other | Admitting: Urology

## 2022-01-28 VITALS — BP 142/85 | HR 59 | Wt 201.1 lb

## 2022-01-28 DIAGNOSIS — C679 Malignant neoplasm of bladder, unspecified: Secondary | ICD-10-CM | POA: Diagnosis not present

## 2022-01-28 DIAGNOSIS — Z8551 Personal history of malignant neoplasm of bladder: Secondary | ICD-10-CM
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
## 2022-01-29 ENCOUNTER — Other Ambulatory Visit: Payer: Self-pay | Admitting: Urology

## 2022-01-29 DIAGNOSIS — C662 Malignant neoplasm of left ureter: Secondary | ICD-10-CM

## 2022-01-29 NOTE — Progress Notes (Signed)
Surgical Physician Order Form [facility] [place]  * Scheduling expectation :  Mid/ late March  *Length of Case:   *Clearance needed: no  *Anticoagulation Instructions: Hold all anticoagulants  *Aspirin Instructions: Ok to continue Aspirin  *Post-op visit Date/Instructions:   TBD  *Diagnosis:  Left distal ureteral TCC  *Procedure: left  ureteroscopy with laser ablation of ureteral tumor, ureteral biopsy, ureteral stent placement , intravesical chemotherapy   Additional orders: Gemcitabine '2000mg'$  bladder instillation  -Admit type: OUTpatient  -Anesthesia: General  -VTE Prophylaxis Standing Order SCD's       Other:   -Standing Lab Orders Per Anesthesia    Lab other: UA&Urine Culture  -Standing Test orders EKG/Chest x-ray per Anesthesia       Test other:   - Medications:  Ancef 2gm IV  -Other orders:  N/A

## 2022-01-31 ENCOUNTER — Telehealth: Payer: Self-pay

## 2022-01-31 NOTE — Progress Notes (Signed)
   [facility] From  Surgery Date: Date: 03/31/2022  Surgeon: Dr. [name], MD  Inpt ( No  )   Outpt (Yes)   Obs ( No  )   Diagnosis: C66.2 Left Distal Ureteral Carcinoma  -CPT: 50757, 32256, 72091, [phone]  Surgery: Left Ureteroscopy with laser ablation of Ureteral Tumor, Left Ureteral Biopsy, Left Ureteral Stent Placement, Intravesical Instillation of Gemcitabine  Stop Anticoagulations: Yes, may continue ASA  Cardiac/Medical/Pulmonary Clearance needed: no  *Orders entered into EPIC  Date: 01/31/22   *Case booked in [place]  Date: 01/31/22  *Notified pt of Surgery: Date: 01/31/22  PRE-OP UA & CX: yes, will obtain in clinic on 03/19/2022  *Placed into Prior Authorization Work Fabio Bering Date: 01/31/22  Assistant/laser/rep:No

## 2022-01-31 NOTE — Telephone Encounter (Signed)
Tried Calling patient to schedule surgery, left message for patient to return my call.

## 2022-01-31 NOTE — Telephone Encounter (Signed)
I spoke with Mr. [name]. We have discussed possible surgery dates and Monday March 18th, 2024 was agreed upon by all parties. Patient given information about surgery date, what to expect pre-operatively and post operatively.  We discussed that a Pre-Admission Testing office will be calling to set up the pre-op visit that will take place prior to surgery, and that these appointments are typically done over the phone with a Pre-Admissions RN. Informed patient that our office will communicate any additional care to be provided after surgery. Patients questions or concerns were discussed during our call. Advised to call our office should there be any additional information, questions or concerns that arise. Patient verbalized understanding.

## 2022-03-04 ENCOUNTER — Other Ambulatory Visit: Payer: Self-pay | Admitting: Student

## 2022-03-04 DIAGNOSIS — R42 Dizziness and giddiness: Secondary | ICD-10-CM

## 2022-03-06 ENCOUNTER — Ambulatory Visit
Admission: RE | Admit: 2022-03-06 | Discharge: 2022-03-06 | Disposition: A | Discharge status: Home / Self Care | Payer: Medicare Other | Source: Ambulatory Visit | Attending: Student | Admitting: Student

## 2022-03-06 DIAGNOSIS — R42 Dizziness and giddiness: Secondary | ICD-10-CM | POA: Diagnosis present

## 2022-03-19 ENCOUNTER — Other Ambulatory Visit: Payer: Medicare Other

## 2022-03-19 DIAGNOSIS — C662 Malignant neoplasm of left ureter: Secondary | ICD-10-CM

## 2022-03-19 LAB — URINALYSIS, COMPLETE
Bilirubin, UA: NEGATIVE
Glucose, UA: NEGATIVE
Ketones, UA: NEGATIVE
Leukocytes,UA: NEGATIVE
Nitrite, UA: NEGATIVE
Protein,UA: NEGATIVE
Specific Gravity, UA: 1.02 (ref 1.005–1.030)
Urobilinogen, Ur: 1 mg/dL (ref 0.2–1.0)
pH, UA: 7 (ref 5.0–7.5)

## 2022-03-19 LAB — MICROSCOPIC EXAMINATION

## 2022-03-21 ENCOUNTER — Encounter
Admission: RE | Admit: 2022-03-21 | Discharge: 2022-03-21 | Disposition: A | Discharge status: Home / Self Care | Payer: Medicare Other | Source: Ambulatory Visit | Attending: Urology | Admitting: Urology

## 2022-03-21 ENCOUNTER — Encounter: Admission: RE | Admit: 2022-03-21 | Payer: Medicare Other | Source: Ambulatory Visit

## 2022-03-21 ENCOUNTER — Other Ambulatory Visit: Payer: Self-pay

## 2022-03-21 NOTE — Patient Instructions (Signed)
Your procedure is scheduled on: Monday 03/31/22 To find out your arrival time, please call [phone] between 1PM - 3PM on:   Friday 03/28/22 Report to the Registration Desk on the 1st floor of the [facility]. Valet parking is available.  If your arrival time is 6:00 am, do not arrive before that time as the [facility] entrance doors do not open until 6:00 am.  REMEMBER: Instructions that are not followed completely may result in serious medical risk, up to and including death; or upon the discretion of your surgeon and anesthesiologist your surgery may need to be rescheduled.  Do not eat food or drink any liquids after midnight the night before surgery.  No gum chewing or hard candies.  One week prior to surgery: Stop Anti-inflammatories (NSAIDS) such as Advil, Aleve, Ibuprofen, Motrin, Naproxen, Naprosyn and Aspirin based products such as Excedrin, Goody's Powder, BC Powder. You may however, continue to take Tylenol if needed for pain up until the day of surgery.  Stop ANY OVER THE COUNTER supplements until after surgery.  Continue taking all prescribed medications.  TAKE ONLY THESE MEDICATIONS THE MORNING OF SURGERY WITH A SIP OF WATER:  amLODipine (NORVASC) 5 MG tablet   No Alcohol for 24 hours before or after surgery.  No Smoking including e-cigarettes for 24 hours before surgery.  No chewable tobacco products for at least 6 hours before surgery.  No nicotine patches on the day of surgery.  Do not use any "recreational" drugs for at least a week (preferably 2 weeks) before your surgery.  Please be advised that the combination of cocaine and anesthesia may have negative outcomes, up to and including death. If you test positive for cocaine, your surgery will be cancelled.  On the morning of surgery brush your teeth with toothpaste and water, you may rinse your mouth with mouthwash if you wish. Do not swallow any toothpaste or mouthwash.  Use CHG Soap or wipes as  directed on instruction sheet.  Shower with your regular soap the morning of your procedure.  Do not wear lotions, powders, or perfumes. You can use deodorant.  Do not shave body hair from the neck down 48 hours before surgery.  Wear comfortable clothing (specific to your surgery type) to the hospital.  Do not wear jewelry, make-up, hairpins, clips or nail polish.  Contact lenses, hearing aids and dentures may not be worn into surgery.  Bring your C-PAP to the hospital in case you may have to spend the night.   Do not bring valuables to the hospital. [facility] is not responsible for any missing/lost belongings or valuables.   Notify your doctor if there is any change in your medical condition (cold, fever, infection).  If you are being discharged the day of surgery, you will not be allowed to drive home. You will need a responsible individual to drive you home and stay with you for 24 hours after surgery.   If you are taking public transportation, you will need to have a responsible individual with you.  If you are being admitted to the hospital overnight, leave your suitcase in the car. After surgery it may be brought to your room.  In case of increased patient census, it may be necessary for you, the patient, to continue your postoperative care in the Same Day Surgery department.  After surgery, you can help prevent lung complications by doing breathing exercises.  Take deep breaths and cough every 1-2 hours. Your doctor may order a device  called an Chiropodist to help you take deep breaths. When coughing or sneezing, hold a pillow firmly against your incision with both hands. This is called "splinting." Doing this helps protect your incision. It also decreases belly discomfort.  Surgery Visitation Policy:  Patients undergoing a surgery or procedure may have two family members or support persons with them as long as the person is not COVID-19 positive or experiencing its  symptoms.   Inpatient Visitation:    Visiting hours are 7 a.m. to 8 p.m. Up to four visitors are allowed at one time in a patient room. The visitors may rotate out with other people during the day. One designated support person (adult) may remain overnight.  Due to an increase in RSV and influenza rates and associated hospitalizations, children ages 36 and under will not be able to visit patients in [facility]. Masks continue to be strongly recommended.  Please call the [facility]. at [phone] if you have any questions about these instructions.

## 2022-03-22 LAB — CULTURE, URINE COMPREHENSIVE

## 2022-03-25 ENCOUNTER — Encounter: Payer: Self-pay | Admitting: Urgent Care
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
## 2022-03-27 ENCOUNTER — Encounter: Payer: Self-pay | Admitting: Urology

## 2022-03-30 MED ORDER — CHLORHEXIDINE GLUCONATE 0.12 % MT SOLN: 15.0000 mL | Freq: Once | OROMUCOSAL | Status: AC

## 2022-03-30 MED ORDER — ORAL CARE MOUTH RINSE: 15.0000 mL | Freq: Once | OROMUCOSAL | Status: AC

## 2022-03-30 MED ORDER — GEMCITABINE CHEMO FOR BLADDER INSTILLATION 2000 MG
2000.0000 mg | Freq: Once | INTRAVENOUS | Status: DC
  Filled 2022-03-30: qty 52.6

## 2022-03-30 MED ORDER — LACTATED RINGERS IV SOLN: INTRAVENOUS | Status: DC

## 2022-03-30 MED ORDER — CEFAZOLIN SODIUM-DEXTROSE 2-4 GM/100ML-% IV SOLN
2.0000 g | INTRAVENOUS | Status: AC
  Administered 2022-03-31: 2 g via INTRAVENOUS

## 2022-03-30 MED ORDER — FAMOTIDINE 20 MG PO TABS: 20.0000 mg | ORAL_TABLET | Freq: Once | ORAL | Status: AC

## 2022-03-31 ENCOUNTER — Encounter: Payer: Self-pay | Admitting: Urology

## 2022-03-31 ENCOUNTER — Ambulatory Visit: Payer: Medicare Other

## 2022-03-31 ENCOUNTER — Ambulatory Visit: Payer: Medicare Other | Admitting: Urgent Care

## 2022-03-31 ENCOUNTER — Other Ambulatory Visit: Payer: Self-pay

## 2022-03-31 ENCOUNTER — Ambulatory Visit
Admission: RE | Admit: 2022-03-31 | Discharge: 2022-03-31 | Disposition: A | Discharge status: Home / Self Care | Payer: Medicare Other | Attending: Urology | Admitting: Urology

## 2022-03-31 ENCOUNTER — Encounter
Admission: RE | Disposition: A | Discharge status: Home / Self Care | Payer: Self-pay | Source: Home / Self Care | Attending: Urology

## 2022-03-31 DIAGNOSIS — K219 Gastro-esophageal reflux disease without esophagitis: Secondary | ICD-10-CM | POA: Diagnosis not present

## 2022-03-31 DIAGNOSIS — I4891 Unspecified atrial fibrillation: Secondary | ICD-10-CM | POA: Insufficient documentation

## 2022-03-31 DIAGNOSIS — I2510 Atherosclerotic heart disease of native coronary artery without angina pectoris: Secondary | ICD-10-CM | POA: Insufficient documentation

## 2022-03-31 DIAGNOSIS — Z79899 Other long term (current) drug therapy: Secondary | ICD-10-CM | POA: Diagnosis not present

## 2022-03-31 DIAGNOSIS — I10 Essential (primary) hypertension: Secondary | ICD-10-CM | POA: Diagnosis not present

## 2022-03-31 DIAGNOSIS — E785 Hyperlipidemia, unspecified: Secondary | ICD-10-CM | POA: Diagnosis not present

## 2022-03-31 DIAGNOSIS — K449 Diaphragmatic hernia without obstruction or gangrene: Secondary | ICD-10-CM | POA: Insufficient documentation

## 2022-03-31 DIAGNOSIS — C669 Malignant neoplasm of unspecified ureter: Secondary | ICD-10-CM | POA: Diagnosis present

## 2022-03-31 DIAGNOSIS — Z8551 Personal history of malignant neoplasm of bladder: Secondary | ICD-10-CM

## 2022-03-31 DIAGNOSIS — C662 Malignant neoplasm of left ureter: Secondary | ICD-10-CM | POA: Diagnosis not present

## 2022-03-31 HISTORY — PX: URETEROSCOPY: SHX842

## 2022-03-31 HISTORY — DX: Atherosclerosis of aorta: I70.0

## 2022-03-31 HISTORY — PX: BLADDER INSTILLATION: SHX6893

## 2022-03-31 HISTORY — DX: Unspecified atrial fibrillation: I48.91

## 2022-03-31 HISTORY — PX: CYSTOSCOPY WITH STENT PLACEMENT: SHX5790

## 2022-03-31 HISTORY — DX: Malignant neoplasm of left ureter: C66.2

## 2022-03-31 HISTORY — DX: Bradycardia, unspecified: R00.1

## 2022-03-31 HISTORY — DX: Obstructive sleep apnea (adult) (pediatric): G47.33

## 2022-03-31 HISTORY — DX: Disorder of arteries and arterioles, unspecified: I77.9

## 2022-03-31 HISTORY — DX: Rosacea, unspecified: L71.9

## 2022-03-31 HISTORY — PX: URETERAL BIOPSY: SHX6688

## 2022-03-31 HISTORY — DX: Malignant neoplasm of bladder, unspecified: C67.9

## 2022-03-31 SURGERY — URETEROSCOPY
Anesthesia: General

## 2022-03-31 MED ORDER — CEFAZOLIN SODIUM-DEXTROSE 2-4 GM/100ML-% IV SOLN
INTRAVENOUS | Status: AC
  Filled 2022-03-31: qty 100

## 2022-03-31 MED ORDER — CHLORHEXIDINE GLUCONATE 0.12 % MT SOLN
OROMUCOSAL | Status: AC
  Administered 2022-03-31: 15 mL via OROMUCOSAL
  Filled 2022-03-31: qty 15

## 2022-03-31 MED ORDER — SUGAMMADEX SODIUM 200 MG/2ML IV SOLN
INTRAVENOUS | Status: DC | PRN
  Administered 2022-03-31: 200 mg via INTRAVENOUS

## 2022-03-31 MED ORDER — FAMOTIDINE 20 MG PO TABS
ORAL_TABLET | ORAL | Status: AC
  Administered 2022-03-31: 20 mg via ORAL
  Filled 2022-03-31: qty 1

## 2022-03-31 MED ORDER — SODIUM CHLORIDE 0.9 % IR SOLN
Status: DC | PRN
  Administered 2022-03-31: 1500 mL

## 2022-03-31 MED ORDER — MIDAZOLAM HCL 2 MG/2ML IJ SOLN
INTRAMUSCULAR | Status: AC
  Filled 2022-03-31: qty 2

## 2022-03-31 MED ORDER — ACETAMINOPHEN 10 MG/ML IV SOLN: 1000.0000 mg | Freq: Once | INTRAVENOUS | Status: DC | PRN

## 2022-03-31 MED ORDER — OXYCODONE HCL 5 MG/5ML PO SOLN: 5.0000 mg | Freq: Once | ORAL | Status: DC | PRN

## 2022-03-31 MED ORDER — PROPOFOL 10 MG/ML IV BOLUS
INTRAVENOUS | Status: DC | PRN
  Administered 2022-03-31: 140 mg via INTRAVENOUS

## 2022-03-31 MED ORDER — HYDROCODONE-ACETAMINOPHEN 5-325 MG PO TABS: 1.0000 | ORAL_TABLET | Freq: Four times a day (QID) | ORAL | 0 refills | Status: DC | PRN

## 2022-03-31 MED ORDER — ONDANSETRON HCL 4 MG/2ML IJ SOLN
INTRAMUSCULAR | Status: AC
  Filled 2022-03-31: qty 2

## 2022-03-31 MED ORDER — EPHEDRINE SULFATE (PRESSORS) 50 MG/ML IJ SOLN
INTRAMUSCULAR | Status: DC | PRN
  Administered 2022-03-31 (×2): 5 mg via INTRAVENOUS

## 2022-03-31 MED ORDER — SEVOFLURANE IN SOLN
RESPIRATORY_TRACT | Status: AC
  Filled 2022-03-31: qty 250

## 2022-03-31 MED ORDER — ROCURONIUM BROMIDE 100 MG/10ML IV SOLN
INTRAVENOUS | Status: DC | PRN
  Administered 2022-03-31: 50 mg via INTRAVENOUS

## 2022-03-31 MED ORDER — TAMSULOSIN HCL 0.4 MG PO CAPS: 0.4000 mg | ORAL_CAPSULE | Freq: Every day | ORAL | 0 refills | Status: DC

## 2022-03-31 MED ORDER — DROPERIDOL 2.5 MG/ML IJ SOLN
INTRAMUSCULAR | Status: AC
  Filled 2022-03-31: qty 2

## 2022-03-31 MED ORDER — ROCURONIUM BROMIDE 10 MG/ML (PF) SYRINGE
PREFILLED_SYRINGE | INTRAVENOUS | Status: AC
  Filled 2022-03-31: qty 10

## 2022-03-31 MED ORDER — GEMCITABINE CHEMO FOR BLADDER INSTILLATION 2000 MG
INTRAVENOUS | Status: DC | PRN
  Administered 2022-03-31: 2000 mg via INTRAVESICAL

## 2022-03-31 MED ORDER — PROPOFOL 10 MG/ML IV BOLUS
INTRAVENOUS | Status: AC
  Filled 2022-03-31: qty 40

## 2022-03-31 MED ORDER — OXYCODONE HCL 5 MG PO TABS: 5.0000 mg | ORAL_TABLET | Freq: Once | ORAL | Status: DC | PRN

## 2022-03-31 MED ORDER — DEXAMETHASONE SODIUM PHOSPHATE 10 MG/ML IJ SOLN
INTRAMUSCULAR | Status: AC
  Filled 2022-03-31: qty 1

## 2022-03-31 MED ORDER — FENTANYL CITRATE (PF) 100 MCG/2ML IJ SOLN: 25.0000 ug | INTRAMUSCULAR | Status: DC | PRN

## 2022-03-31 MED ORDER — FENTANYL CITRATE (PF) 100 MCG/2ML IJ SOLN
INTRAMUSCULAR | Status: DC | PRN
  Administered 2022-03-31: 25 ug via INTRAVENOUS
  Administered 2022-03-31: 75 ug via INTRAVENOUS

## 2022-03-31 MED ORDER — LIDOCAINE HCL (PF) 2 % IJ SOLN
INTRAMUSCULAR | Status: AC
  Filled 2022-03-31: qty 5

## 2022-03-31 MED ORDER — FENTANYL CITRATE (PF) 100 MCG/2ML IJ SOLN
INTRAMUSCULAR | Status: AC
  Filled 2022-03-31: qty 2

## 2022-03-31 MED ORDER — EPHEDRINE 5 MG/ML INJ
INTRAVENOUS | Status: AC
  Filled 2022-03-31: qty 5

## 2022-03-31 MED ORDER — GLYCOPYRROLATE 0.2 MG/ML IJ SOLN
INTRAMUSCULAR | Status: AC
  Filled 2022-03-31: qty 1

## 2022-03-31 MED ORDER — ONDANSETRON HCL 4 MG/2ML IJ SOLN
4.0000 mg | Freq: Once | INTRAMUSCULAR | Status: AC | PRN
  Administered 2022-03-31: 4 mg via INTRAVENOUS

## 2022-03-31 MED ORDER — DEXAMETHASONE SODIUM PHOSPHATE 10 MG/ML IJ SOLN
INTRAMUSCULAR | Status: DC | PRN
  Administered 2022-03-31: 5 mg via INTRAVENOUS

## 2022-03-31 MED ORDER — MIDAZOLAM HCL 2 MG/2ML IJ SOLN
INTRAMUSCULAR | Status: DC | PRN
  Administered 2022-03-31: 1 mg via INTRAVENOUS

## 2022-03-31 MED ORDER — LIDOCAINE HCL (CARDIAC) PF 100 MG/5ML IV SOSY
PREFILLED_SYRINGE | INTRAVENOUS | Status: DC | PRN
  Administered 2022-03-31: 100 mg via INTRAVENOUS

## 2022-03-31 MED ORDER — OXYBUTYNIN CHLORIDE 5 MG PO TABS: 5.0000 mg | ORAL_TABLET | Freq: Three times a day (TID) | ORAL | 0 refills | Status: DC | PRN

## 2022-03-31 MED ORDER — DROPERIDOL 2.5 MG/ML IJ SOLN
0.6250 mg | Freq: Once | INTRAMUSCULAR | Status: AC
  Administered 2022-03-31: 0.625 mg via INTRAVENOUS

## 2022-03-31 SURGICAL SUPPLY — 38 items
BAG DRAIN SIEMENS DORNER NS (MISCELLANEOUS) ×2 IMPLANT
BAG DRN NS LF (MISCELLANEOUS) ×2
BAG PRESSURE INF REUSE 3000 (BAG) ×2 IMPLANT
BRUSH SCRUB EZ  4% CHG (MISCELLANEOUS) ×2
BRUSH SCRUB EZ 1% IODOPHOR (MISCELLANEOUS) ×2 IMPLANT
BRUSH SCRUB EZ 4% CHG (MISCELLANEOUS) ×2 IMPLANT
CATH FOLEY 2WAY  5CC 16FR (CATHETERS) ×2
CATH FOLEY 2WAY 5CC 16FR (CATHETERS) ×2
CATH URET FLEX-TIP 2 LUMEN 10F (CATHETERS) ×2 IMPLANT
CATH URETL OPEN 5X70 (CATHETERS) ×2 IMPLANT
CATH URTH 16FR FL 2W BLN LF (CATHETERS) IMPLANT
CNTNR URN SCR LID CUP LEK RST (MISCELLANEOUS) ×2 IMPLANT
CONT SPEC 4OZ STRL OR WHT (MISCELLANEOUS) ×2
DRSG TELFA 3X4 N-ADH STERILE (GAUZE/BANDAGES/DRESSINGS) ×2 IMPLANT
FIBER LASER MOSES 200 DFL (Laser) IMPLANT
FORCEPS BIOP PIRANHA Y (CUTTING FORCEPS) IMPLANT
GAUZE 4X4 16PLY ~~LOC~~+RFID DBL (SPONGE) ×4 IMPLANT
GLOVE BIO SURGEON STRL SZ 6.5 (GLOVE) ×2 IMPLANT
GOWN STRL REUS W/ TWL LRG LVL3 (GOWN DISPOSABLE) ×4 IMPLANT
GOWN STRL REUS W/TWL LRG LVL3 (GOWN DISPOSABLE) ×4
GUIDEWIRE GREEN .038 145CM (MISCELLANEOUS) ×2 IMPLANT
GUIDEWIRE STR DUAL SENSOR (WIRE) ×4 IMPLANT
IV NS IRRIG 3000ML ARTHROMATIC (IV SOLUTION) ×2 IMPLANT
KIT TURNOVER CYSTO (KITS) ×2 IMPLANT
MANIFOLD NEPTUNE II (INSTRUMENTS) ×2 IMPLANT
NDL SAFETY ECLIP 18X1.5 (MISCELLANEOUS) ×2 IMPLANT
PACK CYSTO AR (MISCELLANEOUS) ×2 IMPLANT
SET CYSTO W/LG BORE CLAMP LF (SET/KITS/TRAYS/PACK) ×2 IMPLANT
SHEATH NAVIGATOR HD 12/14X36 (SHEATH) ×2 IMPLANT
STENT URET 6FRX24 CONTOUR (STENTS) IMPLANT
STENT URET 6FRX26 CONTOUR (STENTS) IMPLANT
SURGILUBE 2OZ TUBE FLIPTOP (MISCELLANEOUS) ×2 IMPLANT
SYR TOOMEY IRRIG 70ML (MISCELLANEOUS) ×2
SYRINGE TOOMEY IRRIG 70ML (MISCELLANEOUS) ×2 IMPLANT
TRAP FLUID SMOKE EVACUATOR (MISCELLANEOUS) ×2 IMPLANT
WATER STERILE IRR 1000ML POUR (IV SOLUTION) ×2 IMPLANT
WATER STERILE IRR 3000ML UROMA (IV SOLUTION) ×2 IMPLANT
WATER STERILE IRR 500ML POUR (IV SOLUTION) ×2 IMPLANT

## 2022-03-31 NOTE — Anesthesia Postprocedure Evaluation (Signed)
Anesthesia Post Note  Patient: [name]  Procedure(s) Performed: URETEROSCOPY WITH LASER ABLATION OF URETERAL TUMOR (Left) URETERAL BIOPSY (Left) CYSTOSCOPY WITH STENT PLACEMENT (Left) BLADDER INSTILLATION OF GEMCITABINE  Patient location during evaluation: PACU Anesthesia Type: General Level of consciousness: awake and alert Pain management: pain level controlled Vital Signs Assessment: post-procedure vital signs reviewed and stable Respiratory status: spontaneous breathing, nonlabored ventilation, respiratory function stable and patient connected to nasal cannula oxygen Cardiovascular status: blood pressure returned to baseline and stable Postop Assessment: no apparent nausea or vomiting Anesthetic complications: no   No notable events documented.   Last Vitals:  Vitals:   03/31/22 0930 03/31/22 0945  BP: 128/85 138/84  Pulse: (!) 49 (!) 50  Resp: 16 11  Temp:    SpO2: 93% 93%    Last Pain:  Vitals:   03/31/22 0945  TempSrc:   PainSc: 0-No pain                 Arita Miss

## 2022-03-31 NOTE — Anesthesia Procedure Notes (Signed)
Procedure Name: Intubation Date/Time: 03/31/2022 8:03 AM  Performed by: Jacqualin Combes, CRNAPre-anesthesia Checklist: Patient identified, Emergency Drugs available, Suction available and Patient being monitored Patient Re-evaluated:Patient Re-evaluated prior to induction Oxygen Delivery Method: Circle system utilized Preoxygenation: Pre-oxygenation with 100% oxygen Induction Type: IV induction Ventilation: Mask ventilation without difficulty Laryngoscope Size: Mac and 4 Grade View: Grade II Tube type: Oral Number of attempts: 1 Airway Equipment and Method: Stylet and Oral airway Placement Confirmation: ETT inserted through vocal cords under direct vision, positive ETCO2 and breath sounds checked- equal and bilateral Secured at: 22 cm Tube secured with: Tape Dental Injury: Teeth and Oropharynx as per pre-operative assessment  Comments: Lauren Cozart, SRNA placed ETT under supervison.
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
## 2022-03-31 NOTE — Discharge Instructions (Addendum)
You have a ureteral stent in place.  This is a tube that extends from your kidney to your bladder.  This may cause urinary bleeding, burning with urination, and urinary frequency.  Please call our office or present to the ED if you develop fevers >101 or pain which is not able to be controlled with oral pain medications.  You may be given either Flomax and/ or ditropan to help with bladder spasms and stent pain in addition to pain medications.    [facility] [address], [address] , [place] [address] [phone] AMBULATORY SURGERY  DISCHARGE INSTRUCTIONS   The drugs that you were given will stay in your system until tomorrow so for the next 24 hours you should not:  Drive an automobile Make any legal decisions Drink any alcoholic beverage   You may resume regular meals tomorrow.  Today it is better to start with liquids and gradually work up to solid foods.  You may eat anything you prefer, but it is better to start with liquids, then soup and crackers, and gradually work up to solid foods.   Please notify your doctor immediately if you have any unusual bleeding, trouble breathing, redness and pain at the surgery site, drainage, fever, or pain not relieved by medication.    Additional Instructions:        Please contact your physician with any problems or Same Day Surgery at [phone], Monday through Friday 6 am to 4 pm, or [facility] [place] Main number at [phone].  

## 2022-03-31 NOTE — Op Note (Signed)
Date of procedure: 03/31/22  Preoperative diagnosis:  History of bladder cancer Left distal ureteral carcinoma, low-grade  Postoperative diagnosis:  Same as above  Procedure: Cystoscopy Retrograde pyelogram Left ureteroscopy with ureteral biopsy Left ureteral stent placement Laser ablation of presumed left distal ureteral tumor  Surgeon: [name], MD  Anesthesia: General  Complications: None  Intraoperative findings: Very subtle persistent thickening of the left distal ureter just distal to the iliacs over approximately 2 cm area.  There is nonspecific thickening rather than overt papillary tumor.  Several areas were biopsied.  All suspicious areas and areas of thickening were fulgurated using ablative laser settings of 1 J and 15 Hz.  Stent placed without tether.  Intravesical gemcitabine was instilled.  Bladder was unremarkable.  EBL: Minimal  Specimens: Left distal ureteral biopsy  Drains: 6 x 24 French double-J ureteral stent on left  Indication: BLEU REICHEL is a 72 y.o. patient with remote history of low-grade bladder cancer found to have abnormal CT urogram with left distal ureteral abnormality.  He was taken several months ago to the operating room at which time ureteral fullness but no appreciable tumor was identified.  Surgical pathology favored low-grade noninvasive TCC.  He returns today for second look, additional biopsies and fulguration.  After reviewing the management options for treatment, he elected to proceed with the above surgical procedure(s). We have discussed the potential benefits and risks of the procedure, side effects of the proposed treatment, the likelihood of the patient achieving the goals of the procedure, and any potential problems that might occur during the procedure or recuperation. Informed consent has been obtained.  Description of procedure:  The patient was taken to the operating room and general anesthesia was induced.  The patient  was placed in the dorsal lithotomy position, prepped and draped in the usual sterile fashion, and preoperative antibiotics were administered. A preoperative time-out was performed.   21 [place] scope was advanced per urethra into the bladder.  The bladder was carefully inspected.  It was free of any tumors lesions or ulcerations.  Attention was turned to the left UO.  It was cannulated using a 5 [place] open-ended ureteral catheter.  A gentle retrograde pyelogram on the side was unremarkable without any obvious filling defects and a decompressed upper tract collecting system which appeared delicate.  A wire was then placed up to the level of the kidney.  Was stopped in place as a safety wire.  A semirigid ureteroscope was then brought in and within the distal ureter just distal to the iliacs, there were several centimeters that appeared to thicker and more full but there is no overt papillary changes.  This was in contrast to areas proximal to this which had a wide open and smooth appearance.  I then took Parratto biopsy forceps and biopsied several areas of the more full appearance.  These were passed off the field as left distal ureteral biopsies.  I then advanced the scope all the way up to the level of the proximal ureter and no additional areas of concern were identified.  A 200 m laser fiber was then brought in and using ablative settings of 1 J and 15 Hz, I ablated the entirety of the surface area just distal to the iliacs that had any appreciable visualized differences.  There is also another area more distal approximately 1 cm proximal to the UO which was also fulgurated as a precaution.  The ablation was very superficial there is no concern for ureteral perforation.  I then backloaded the wire over rigid cystoscope and a 6 x 24 French double-J ureteral stent was placed with a full coil both within the renal pelvis and the bladder once the wire was withdrawn.  Finally the bladder was drained and the scope  was removed.  A 16 French Foley catheter was placed.  The patient was cleaned and dried, repositioned in supine position, reversed from anesthesia and taken to the PACU in stable condition.  2000 mg of intravesical gemcitabine was instilled into the bladder and allowed to dwell for 1 hour in the PACU.  This was well-tolerated.  The end of an hour, the chemotherapy was drained and the Foley catheter was removed.  Plan: I will call him with his pathology results.  Will plan to keep his stent in when he returns from [place] in 2 weeks the second week of April.  This allow for healing of the ureteral mucosa where was ablated.  Follow-up plan to be determined based on pathology. [name], M.D.

## 2022-03-31 NOTE — Progress Notes (Signed)
Foley catheter unclamped at 0945 per order as this reaches the 1 hour mark of the gemcitabine instillation. Patient tolerated this very well. After the bladder was drained, the foley was removed per order. Patient also tolerated this very well.

## 2022-03-31 NOTE — Transfer of Care (Signed)
Immediate Anesthesia Transfer of Care Note  Patient: [name]  Procedure(s) Performed: URETEROSCOPY WITH LASER ABLATION OF URETERAL TUMOR (Left) URETERAL BIOPSY (Left) CYSTOSCOPY WITH STENT PLACEMENT (Left) BLADDER INSTILLATION OF GEMCITABINE  Patient Location: PACU  Anesthesia Type:General  Level of Consciousness: awake, drowsy, and patient cooperative  Airway & Oxygen Therapy: Patient Spontanous Breathing and Patient connected to face mask oxygen  Post-op Assessment: Report given to RN and Post -op Vital signs reviewed and stable  Post vital signs: Reviewed and stable  Last Vitals:  Vitals Value Taken Time  BP 148/79 03/31/22 0900  Temp    Pulse 72 03/31/22 0902  Resp 17 03/31/22 0902  SpO2 97 % 03/31/22 0902  Vitals shown include unvalidated device data.  Last Pain:  Vitals:   03/31/22 [id number]  TempSrc: Oral         Complications: No notable events documented.

## 2022-04-01 ENCOUNTER — Encounter: Payer: Self-pay | Admitting: Urology

## 2022-04-01 LAB — SURGICAL PATHOLOGY

## 2022-04-23 ENCOUNTER — Ambulatory Visit: Payer: Medicare Other | Admitting: Urology

## 2022-04-23 VITALS — BP 143/74 | HR 55 | Ht 70.0 in | Wt 203.0 lb

## 2022-04-23 DIAGNOSIS — C662 Malignant neoplasm of left ureter: Secondary | ICD-10-CM | POA: Diagnosis not present
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
## 2022-04-24 ENCOUNTER — Encounter: Payer: Self-pay | Admitting: Urology

## 2022-04-28 ENCOUNTER — Telehealth: Payer: Self-pay | Admitting: Urology

## 2022-04-28 NOTE — Telephone Encounter (Signed)
Patient called to schedule his BCG treatments. Please call him back to schedule. [phone]

## 2022-05-12 ENCOUNTER — Other Ambulatory Visit: Payer: Self-pay | Admitting: Urology

## 2022-05-18 NOTE — Progress Notes (Unsigned)
BCG Bladder Instillation  BCG # 1/6  Due to Bladder Cancer patient is present today for a BCG treatment. Patient was cleaned and prepped in a sterile fashion with betadine. A 14 FR catheter was inserted, urine return was noted 30 ml, urine was amber in color.  50ml of reconstituted BCG was instilled into the bladder. The catheter was then removed. Patient tolerated well, no complications were noted  Performed by: Michiel Cowboy, PA-C and Luther Hearing, CMA and Dutch Quint, CMA   Follow up/ Additional notes:  Follow up in one week for #2/6 and cysto (11/04/2022) with Dr. Apolinar Junes

## 2022-05-19 ENCOUNTER — Encounter: Payer: Self-pay | Admitting: Urology

## 2022-05-19 ENCOUNTER — Other Ambulatory Visit
Admission: RE | Admit: 2022-05-19 | Discharge: 2022-05-19 | Disposition: A | Discharge status: Home / Self Care | Payer: Medicare Other | Attending: Urology | Admitting: Urology

## 2022-05-19 ENCOUNTER — Ambulatory Visit: Payer: Medicare Other | Admitting: Urology

## 2022-05-19 ENCOUNTER — Other Ambulatory Visit: Payer: Self-pay

## 2022-05-19 VITALS — BP 127/71 | HR 59 | Ht 70.0 in | Wt 203.0 lb

## 2022-05-19 DIAGNOSIS — Z8551 Personal history of malignant neoplasm of bladder: Secondary | ICD-10-CM

## 2022-05-19 DIAGNOSIS — C662 Malignant neoplasm of left ureter: Secondary | ICD-10-CM | POA: Diagnosis not present

## 2022-05-19 LAB — URINALYSIS, COMPLETE (UACMP) WITH MICROSCOPIC
Glucose, UA: NEGATIVE mg/dL
Nitrite: NEGATIVE
Protein, ur: >300 mg/dL — AB
RBC / HPF: >50 RBC/hpf (ref 0–5)
Specific Gravity, Urine: >1.03 — ABNORMAL HIGH (ref 1.005–1.030)
pH: 5.5 (ref 5.0–8.0)

## 2022-05-19 MED ORDER — BCG LIVE 50 MG IS SUSR
3.2400 mL | Freq: Once | INTRAVESICAL | Status: AC
Start: 2022-05-19 — End: 2022-05-19
  Administered 2022-05-19: 81 mg via INTRAVESICAL

## 2022-05-19 NOTE — Patient Instructions (Signed)

## 2022-05-20 ENCOUNTER — Telehealth: Payer: Self-pay | Admitting: Urology

## 2022-05-20 NOTE — Telephone Encounter (Signed)
I left message with [name] answering machine.  I was calling to see if he had any issues with his first BCG treatment yesterday.

## 2022-05-25 NOTE — Progress Notes (Unsigned)
BCG Bladder Instillation  BCG # 2/6  He denied any issues with his first BCG instillation.   Due to Bladder Cancer patient is present today for a BCG treatment. Patient was cleaned and prepped in a sterile fashion with betadine. A 14 FR catheter was inserted, urine return was noted 50 mL, urine was light pink in color.  50ml of reconstituted BCG was instilled into the bladder. The catheter was then removed. Patient tolerated well, no complications were noted  Performed by: Michiel Cowboy, PA-C and Honor Loh, CMA  Follow up/ Additional notes: Follow up in one week for #3/6 BCG and cysto (11/04/2022) w/ Dr. Apolinar Junes

## 2022-05-26 ENCOUNTER — Other Ambulatory Visit
Admission: RE | Admit: 2022-05-26 | Discharge: 2022-05-26 | Disposition: A | Discharge status: Home / Self Care | Payer: Medicare Other | Attending: Urology | Admitting: Urology

## 2022-05-26 ENCOUNTER — Ambulatory Visit: Payer: Medicare Other | Admitting: Urology

## 2022-05-26 ENCOUNTER — Other Ambulatory Visit: Payer: Self-pay | Admitting: Family Medicine

## 2022-05-26 DIAGNOSIS — R3129 Other microscopic hematuria: Secondary | ICD-10-CM

## 2022-05-26 DIAGNOSIS — C662 Malignant neoplasm of left ureter: Secondary | ICD-10-CM

## 2022-05-26 LAB — URINALYSIS, COMPLETE (UACMP) WITH MICROSCOPIC
Glucose, UA: NEGATIVE mg/dL
Nitrite: NEGATIVE
Protein, ur: >300 mg/dL — AB
RBC / HPF: >50 RBC/hpf (ref 0–5)
Specific Gravity, Urine: 1.025 (ref 1.005–1.030)
pH: 6 (ref 5.0–8.0)

## 2022-05-26 MED ORDER — BCG LIVE 50 MG IS SUSR
3.2400 mL | Freq: Once | INTRAVESICAL | Status: AC
Start: 2022-05-26 — End: 2022-05-26
  Administered 2022-05-26: 81 mg via INTRAVESICAL

## 2022-05-31 NOTE — Progress Notes (Unsigned)
BCG Bladder Instillation  BCG # 3/6  Due to Bladder Cancer patient is present today for a BCG treatment. Patient was cleaned and prepped in a sterile fashion with betadine. A 14 FR catheter was inserted, urine return was noted 30 ml, urine was yellow clear in color.  50ml of reconstituted BCG was instilled into the bladder. The catheter was then removed. Patient tolerated well, no complications were noted  Performed by: Michiel Cowboy, PA-C and Luther Hearing, CMA   Follow up/ Additional notes: Follow up in one week for #4/6 BCG and cysto (11/04/2022) w/ Dr. Apolinar Junes

## 2022-06-02 ENCOUNTER — Other Ambulatory Visit
Admission: RE | Admit: 2022-06-02 | Discharge: 2022-06-02 | Disposition: A | Discharge status: Home / Self Care | Payer: Medicare Other | Attending: Urology | Admitting: Urology

## 2022-06-02 ENCOUNTER — Ambulatory Visit: Payer: Medicare Other | Admitting: Urology

## 2022-06-02 ENCOUNTER — Encounter: Payer: Self-pay | Admitting: Urology

## 2022-06-02 ENCOUNTER — Other Ambulatory Visit: Payer: Self-pay

## 2022-06-02 VITALS — BP 119/68 | HR 56 | Ht 70.0 in | Wt 203.0 lb

## 2022-06-02 DIAGNOSIS — Z8551 Personal history of malignant neoplasm of bladder: Secondary | ICD-10-CM | POA: Insufficient documentation

## 2022-06-02 DIAGNOSIS — C662 Malignant neoplasm of left ureter: Secondary | ICD-10-CM | POA: Diagnosis not present

## 2022-06-02 LAB — URINALYSIS, COMPLETE (UACMP) WITH MICROSCOPIC
Glucose, UA: NEGATIVE mg/dL
Ketones, ur: NEGATIVE mg/dL
Nitrite: NEGATIVE
Protein, ur: 100 mg/dL — AB
RBC / HPF: >50 RBC/hpf (ref 0–5)
Specific Gravity, Urine: 1.025 (ref 1.005–1.030)
pH: 6 (ref 5.0–8.0)

## 2022-06-02 MED ORDER — BCG LIVE 50 MG IS SUSR
3.2400 mL | Freq: Once | INTRAVESICAL | Status: AC
Start: 2022-06-02 — End: 2022-06-02
  Administered 2022-06-02: 81 mg via INTRAVESICAL

## 2022-06-02 NOTE — Patient Instructions (Signed)

## 2022-06-09 NOTE — Progress Notes (Unsigned)
BCG Bladder Instillation  BCG # 4/6  Due to Bladder Cancer patient is present today for a BCG treatment. Patient was cleaned and prepped in a sterile fashion with betadine. A 14FR catheter was inserted, urine return was noted 50 ml, urine was amber clear in color.  50ml of reconstituted BCG was instilled into the bladder. The catheter was then removed. Patient tolerated well, no complications were noted  Performed by: Michiel Cowboy, PA-C and Humberta Magallon-Mariche, CMA  Follow up/ Additional notes: Follow up in one week for #5/6 BCG and cysto (11/04/2022) w/ Dr. Apolinar Junes

## 2022-06-10 ENCOUNTER — Ambulatory Visit: Payer: Medicare Other | Admitting: Urology

## 2022-06-10 VITALS — BP 120/78 | HR 69 | Ht 70.0 in | Wt 203.0 lb

## 2022-06-10 DIAGNOSIS — C662 Malignant neoplasm of left ureter: Secondary | ICD-10-CM | POA: Diagnosis not present

## 2022-06-10 LAB — MICROSCOPIC EXAMINATION: RBC, Urine: >30 /hpf — AB (ref 0–2)

## 2022-06-10 LAB — URINALYSIS, COMPLETE
Bilirubin, UA: NEGATIVE
Glucose, UA: NEGATIVE
Ketones, UA: NEGATIVE
Nitrite, UA: NEGATIVE
Specific Gravity, UA: 1.01 (ref 1.005–1.030)
Urobilinogen, Ur: 0.2 mg/dL (ref 0.2–1.0)
pH, UA: 7 (ref 5.0–7.5)

## 2022-06-10 MED ORDER — BCG LIVE 50 MG IS SUSR
3.2400 mL | Freq: Once | INTRAVESICAL | Status: AC
Start: 2022-06-10 — End: 2022-06-10
  Administered 2022-06-10: 81 mg via INTRAVESICAL

## 2022-06-10 NOTE — Addendum Note (Signed)
Addended byRanda Lynn on: 06/10/2022 02:37 PM   Modules accepted: Orders

## 2022-06-15 NOTE — Progress Notes (Unsigned)
BCG Bladder Instillation  BCG # 5/6  Due to Bladder Cancer patient is present today for a BCG treatment. Patient was cleaned and prepped in a sterile fashion with betadine. A 14 FR catheter was inserted, urine return was noted 100 ml, urine was amber in color.  50ml of reconstituted BCG was instilled into the bladder. The catheter was then removed. Patient tolerated well, no complications were noted  Performed by: Michiel Cowboy, PA-C and Luther Hearing, CMA   Follow up/ Additional notes: Follow up in one week for #6/6 BCG and cysto (11/04/2022) w/ Dr. Apolinar Junes

## 2022-06-16 ENCOUNTER — Ambulatory Visit: Payer: Medicare Other | Admitting: Urology

## 2022-06-16 ENCOUNTER — Other Ambulatory Visit: Payer: Self-pay

## 2022-06-16 ENCOUNTER — Other Ambulatory Visit
Admission: RE | Admit: 2022-06-16 | Discharge: 2022-06-16 | Disposition: A | Discharge status: Home / Self Care | Payer: Medicare Other | Attending: Urology | Admitting: Urology

## 2022-06-16 VITALS — BP 129/74 | HR 55

## 2022-06-16 DIAGNOSIS — Z8551 Personal history of malignant neoplasm of bladder: Secondary | ICD-10-CM

## 2022-06-16 DIAGNOSIS — C662 Malignant neoplasm of left ureter: Secondary | ICD-10-CM | POA: Diagnosis not present

## 2022-06-16 LAB — URINALYSIS, COMPLETE (UACMP) WITH MICROSCOPIC
Glucose, UA: NEGATIVE mg/dL
Ketones, ur: NEGATIVE mg/dL
Nitrite: NEGATIVE
Protein, ur: >300 mg/dL — AB
RBC / HPF: >50 RBC/hpf (ref 0–5)
Specific Gravity, Urine: >1.03 — ABNORMAL HIGH (ref 1.005–1.030)
Squamous Epithelial / HPF: NONE SEEN /HPF (ref 0–5)
pH: 6 (ref 5.0–8.0)

## 2022-06-16 MED ORDER — BCG LIVE 50 MG IS SUSR
3.2400 mL | Freq: Once | INTRAVESICAL | Status: AC
Start: 2022-06-16 — End: 2022-06-16
  Administered 2022-06-16: 81 mg via INTRAVESICAL

## 2022-06-23 ENCOUNTER — Ambulatory Visit: Payer: Medicare Other | Admitting: Urology

## 2022-06-23 ENCOUNTER — Other Ambulatory Visit: Payer: Self-pay

## 2022-06-23 ENCOUNTER — Other Ambulatory Visit
Admission: RE | Admit: 2022-06-23 | Discharge: 2022-06-23 | Disposition: A | Discharge status: Home / Self Care | Payer: Medicare Other | Attending: Urology | Admitting: Urology

## 2022-06-23 ENCOUNTER — Encounter: Payer: Self-pay | Admitting: Urology

## 2022-06-23 VITALS — BP 114/67 | HR 60 | Ht 70.0 in | Wt 203.0 lb

## 2022-06-23 DIAGNOSIS — Z8551 Personal history of malignant neoplasm of bladder: Secondary | ICD-10-CM

## 2022-06-23 DIAGNOSIS — C662 Malignant neoplasm of left ureter: Secondary | ICD-10-CM | POA: Diagnosis not present

## 2022-06-23 LAB — URINALYSIS, COMPLETE (UACMP) WITH MICROSCOPIC
Bilirubin Urine: NEGATIVE
Glucose, UA: NEGATIVE mg/dL
Ketones, ur: NEGATIVE mg/dL
Nitrite: NEGATIVE
Protein, ur: 100 mg/dL — AB
RBC / HPF: >50 RBC/hpf (ref 0–5)
Specific Gravity, Urine: 1.02 (ref 1.005–1.030)
pH: 7 (ref 5.0–8.0)

## 2022-06-23 MED ORDER — BCG LIVE 50 MG IS SUSR
3.2400 mL | Freq: Once | INTRAVESICAL | Status: AC
Start: 2022-06-23 — End: 2022-06-23
  Administered 2022-06-23: 81 mg via INTRAVESICAL

## 2022-06-23 NOTE — Progress Notes (Signed)
BCG Bladder Instillation  BCG # 6/6  Due to Bladder Cancer patient is present today for a BCG treatment. Patient was cleaned and prepped in a sterile fashion with betadine. A 14 FR Coude catheter was inserted, urine return was noted 100 ml, urine was yellow in color.  50ml of reconstituted BCG was instilled into the bladder. The catheter was then removed. Patient tolerated well, no complications were noted  Performed by: Michiel Cowboy, PA-C and Luther Hearing, CMA   Follow up/ Additional notes: Follow up in one week for #6/6 BCG and second look TURBT/Ureteroscopy w/ Dr. Apolinar Junes in September/October

## 2022-06-25 ENCOUNTER — Other Ambulatory Visit: Payer: Self-pay | Admitting: Urology

## 2022-06-25 DIAGNOSIS — C662 Malignant neoplasm of left ureter: Secondary | ICD-10-CM

## 2022-06-25 NOTE — Progress Notes (Signed)
Surgical Physician Order Form [facility]  * Scheduling expectation :  Mid September/Early October with Dr. Apolinar Junes   *Length of Case:   *Clearance needed: no  *Anticoagulation Instructions: Hold all anticoagulants  *Aspirin Instructions: Ok to continue Aspirin  *Post-op visit Date/Instructions:   TBD  *Diagnosis:  Left distal ureteral TCC  *Procedure: Left ureteroscopy with laser ablation of ureteral tumor, ureteral biopsy, ureteral stent placement and intravascular chemotherapy   Additional orders: Gemcitabine 2000mg  bladder instillation  -Admit type: OUTpatient  -Anesthesia: General  -VTE Prophylaxis Standing Order SCD's       Other:   -Standing Lab Orders Per Anesthesia    Lab other: Urinalysis and Urine culture   -Standing Test orders EKG/Chest x-ray per Anesthesia       Test other:   - Medications:  Ancef 2gm IV  -Other orders:  N/A

## 2022-06-26 ENCOUNTER — Telehealth: Payer: Self-pay

## 2022-06-26 NOTE — Telephone Encounter (Signed)
Tried calling patient, no answer. Did leave detailed voicemail to call back to schedule surgery.

## 2022-06-30 ENCOUNTER — Ambulatory Visit: Payer: Medicare Other | Admitting: Urology

## 2022-06-30 NOTE — Telephone Encounter (Signed)
I spoke with Mr. [name]. We have discussed possible surgery dates and Monday September 16th, 2024 was agreed upon by all parties. Patient given information about surgery date, what to expect pre-operatively and post operatively.  We discussed that a Pre-Admission Testing office will be calling to set up the pre-op visit that will take place prior to surgery, and that these appointments are typically done over the phone with a Pre-Admissions RN. Informed patient that our office will communicate any additional care to be provided after surgery. Patients questions or concerns were discussed during our call. Advised to call our office should there be any additional information, questions or concerns that arise. Patient verbalized understanding.

## 2022-06-30 NOTE — Progress Notes (Signed)
   [facility] Surgical Posting Form  Surgery Date: Date: 09/29/2022  Surgeon: Dr. Vanna Scotland, MD  Inpt ( No  )   Outpt (Yes)   Obs ( No  )   Diagnosis: C66.2 Carcinoma of Left Ureter  -CPT: 52354, 08657, 84696, [phone]  Surgery: Left Ureteroscopy with Laser Ablation of Ureteral Tumor, Left Ureteral Biopsy, Left Ureteral Stent Placement and Intravesical Instillation of Gemcitabine  Stop Anticoagulations: Yes, may continue ASA  Cardiac/Medical/Pulmonary Clearance needed: no  *Orders entered into EPIC  Date: 06/30/22   *Case booked in [place]  Date: 06/30/22  *Notified pt of Surgery: Date: 06/30/22  PRE-OP UA & CX: yes, will obtain in clinic on 09/18/2022  *Placed into Prior Authorization Work Angela Nevin Date: 06/30/22  Assistant/laser/rep:No

## 2022-09-18 ENCOUNTER — Other Ambulatory Visit: Payer: Medicare Other

## 2022-09-18 ENCOUNTER — Other Ambulatory Visit: Payer: Self-pay

## 2022-09-18 DIAGNOSIS — C662 Malignant neoplasm of left ureter: Secondary | ICD-10-CM

## 2022-09-18 DIAGNOSIS — N138 Other obstructive and reflux uropathy: Secondary | ICD-10-CM

## 2022-09-19 ENCOUNTER — Other Ambulatory Visit: Payer: Self-pay

## 2022-09-19 ENCOUNTER — Encounter
Admission: RE | Admit: 2022-09-19 | Discharge: 2022-09-19 | Disposition: A | Discharge status: Home / Self Care | Payer: Medicare Other | Source: Ambulatory Visit | Attending: Urology

## 2022-09-19 HISTORY — DX: Other specified postprocedural states: Z98.890

## 2022-09-19 HISTORY — DX: Unspecified atrial flutter: I48.92

## 2022-09-19 LAB — URINALYSIS, COMPLETE
Bilirubin, UA: NEGATIVE
Glucose, UA: NEGATIVE
Nitrite, UA: NEGATIVE
Specific Gravity, UA: >1.03 — ABNORMAL HIGH (ref 1.005–1.030)
Urobilinogen, Ur: 1 mg/dL (ref 0.2–1.0)
pH, UA: 6 (ref 5.0–7.5)

## 2022-09-19 LAB — MICROSCOPIC EXAMINATION: RBC, Urine: >30 /HPF — AB (ref 0–2)

## 2022-09-19 NOTE — Patient Instructions (Addendum)
Your procedure is scheduled on: Monday September 16  Report to the Registration Desk on the 1st floor of the [facility] To find out your arrival time, please call [phone] between 1PM - 3PM on:  Friday September 13  If your arrival time is 6:00 am, do not arrive before that time as the [facility] entrance doors do not open until 6:00 am.  REMEMBER: Instructions that are not followed completely may result in serious medical risk, up to and including death; or upon the discretion of your surgeon and anesthesiologist your surgery may need to be rescheduled.  Do not eat food after midnight the night before surgery.  No gum chewing or hard candies.  One week prior to surgery: Starting Monday September 9  Stop Anti-inflammatories (NSAIDS) such as Advil, Aleve, Ibuprofen, Motrin, Naproxen, Naprosyn and Aspirin based products such as Excedrin, Goody's Powder, BC Powder. Stop ANY OVER THE COUNTER supplements until after surgery. Multiple Vitamin  You may however, continue to take Tylenol if needed for pain up until the day of surgery.  Follow recommendations from Cardiologist or PCP regarding stopping blood thinners. Per Dr. Apolinar Junes continue aspirin 81 mg   TAKE ONLY THESE MEDICATIONS THE MORNING OF SURGERY WITH A SIP OF WATER:  amLODipine (NORVASC)  aspirin EC    No Alcohol for 24 hours before or after surgery.  No Smoking including e-cigarettes for 24 hours before surgery.  No chewable tobacco products for at least 6 hours before surgery.  No nicotine patches on the day of surgery.  Do not use any "recreational" drugs for at least a week (preferably 2 weeks) before your surgery.  Please be advised that the combination of cocaine and anesthesia may have negative outcomes, up to and including death. If you test positive for cocaine, your surgery will be cancelled.  On the morning of surgery brush your teeth with toothpaste and water, you may rinse your mouth with mouthwash if  you wish. Do not swallow any toothpaste or mouthwash.  Do not wear jewelry, make-up, hairpins, clips or nail polish.  Do not wear lotions, powders, or perfumes.   Do not shave body hair from the neck down 48 hours before surgery.  Contact lenses, hearing aids and dentures may not be worn into surgery.  Do not bring valuables to the hospital. [facility] is not responsible for any missing/lost belongings or valuables.   Notify your doctor if there is any change in your medical condition (cold, fever, infection).  Wear comfortable clothing (specific to your surgery type) to the hospital.  After surgery, you can help prevent lung complications by doing breathing exercises.  Take deep breaths and cough every 1-2 hours.   If you are being discharged the day of surgery, you will not be allowed to drive home. You will need a responsible individual to drive you home and stay with you for 24 hours after surgery.   If you are taking public transportation, you will need to have a responsible individual with you.  Please call the Pre-admissions Testing Dept. at [phone] if you have any questions about these instructions.  Surgery Visitation Policy:  Patients having surgery or a procedure may have two visitors.  Children under the age of 32 must have an adult with them who is not the patient.

## 2022-09-22 ENCOUNTER — Encounter
Admission: RE | Admit: 2022-09-22 | Discharge: 2022-09-22 | Disposition: A | Discharge status: Home / Self Care | Payer: Medicare Other | Source: Ambulatory Visit | Attending: Urology | Admitting: Urology

## 2022-09-22 DIAGNOSIS — Z01810 Encounter for preprocedural cardiovascular examination: Secondary | ICD-10-CM

## 2022-09-22 DIAGNOSIS — Z9889 Other specified postprocedural states: Secondary | ICD-10-CM | POA: Diagnosis not present

## 2022-09-22 DIAGNOSIS — Z8679 Personal history of other diseases of the circulatory system: Secondary | ICD-10-CM | POA: Insufficient documentation

## 2022-09-22 DIAGNOSIS — Z01812 Encounter for preprocedural laboratory examination: Secondary | ICD-10-CM

## 2022-09-22 LAB — CULTURE, URINE COMPREHENSIVE

## 2022-09-24 ENCOUNTER — Encounter: Payer: Self-pay | Admitting: Urology
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
## 2022-09-28 MED ORDER — LACTATED RINGERS IV SOLN: INTRAVENOUS | Status: DC

## 2022-09-28 MED ORDER — GEMCITABINE CHEMO FOR BLADDER INSTILLATION 2000 MG
2000.0000 mg | Freq: Once | INTRAVENOUS | Status: DC
  Filled 2022-09-28: qty 52.6

## 2022-09-28 MED ORDER — CHLORHEXIDINE GLUCONATE 0.12 % MT SOLN
15.0000 mL | Freq: Once | OROMUCOSAL | Status: AC
  Administered 2022-09-29: 15 mL via OROMUCOSAL

## 2022-09-28 MED ORDER — CEFAZOLIN SODIUM-DEXTROSE 2-4 GM/100ML-% IV SOLN
2.0000 g | INTRAVENOUS | Status: AC
  Administered 2022-09-29: 2 g via INTRAVENOUS

## 2022-09-28 MED ORDER — ORAL CARE MOUTH RINSE: 15.0000 mL | Freq: Once | OROMUCOSAL | Status: AC

## 2022-09-29 ENCOUNTER — Other Ambulatory Visit: Payer: Self-pay

## 2022-09-29 ENCOUNTER — Ambulatory Visit
Admission: RE | Admit: 2022-09-29 | Discharge: 2022-09-29 | Disposition: A | Discharge status: Home / Self Care | Payer: Medicare Other | Attending: Urology | Admitting: Urology

## 2022-09-29 ENCOUNTER — Encounter: Payer: Self-pay | Admitting: Urology

## 2022-09-29 ENCOUNTER — Ambulatory Visit: Payer: Medicare Other

## 2022-09-29 ENCOUNTER — Ambulatory Visit: Payer: Medicare Other | Admitting: Urgent Care

## 2022-09-29 ENCOUNTER — Encounter
Admission: RE | Disposition: A | Discharge status: Home / Self Care | Payer: Self-pay | Source: Home / Self Care | Attending: Urology

## 2022-09-29 DIAGNOSIS — T8389XA Other specified complication of genitourinary prosthetic devices, implants and grafts, initial encounter: Secondary | ICD-10-CM | POA: Insufficient documentation

## 2022-09-29 DIAGNOSIS — Z9889 Other specified postprocedural states: Secondary | ICD-10-CM

## 2022-09-29 DIAGNOSIS — N400 Enlarged prostate without lower urinary tract symptoms: Secondary | ICD-10-CM | POA: Insufficient documentation

## 2022-09-29 DIAGNOSIS — Z8551 Personal history of malignant neoplasm of bladder: Secondary | ICD-10-CM

## 2022-09-29 DIAGNOSIS — Z833 Family history of diabetes mellitus: Secondary | ICD-10-CM | POA: Insufficient documentation

## 2022-09-29 DIAGNOSIS — G4733 Obstructive sleep apnea (adult) (pediatric): Secondary | ICD-10-CM | POA: Insufficient documentation

## 2022-09-29 DIAGNOSIS — I2510 Atherosclerotic heart disease of native coronary artery without angina pectoris: Secondary | ICD-10-CM | POA: Insufficient documentation

## 2022-09-29 DIAGNOSIS — I4891 Unspecified atrial fibrillation: Secondary | ICD-10-CM | POA: Diagnosis not present

## 2022-09-29 DIAGNOSIS — Z7901 Long term (current) use of anticoagulants: Secondary | ICD-10-CM | POA: Insufficient documentation

## 2022-09-29 DIAGNOSIS — I10 Essential (primary) hypertension: Secondary | ICD-10-CM | POA: Diagnosis not present

## 2022-09-29 DIAGNOSIS — Z87442 Personal history of urinary calculi: Secondary | ICD-10-CM | POA: Diagnosis not present

## 2022-09-29 DIAGNOSIS — Z01812 Encounter for preprocedural laboratory examination: Secondary | ICD-10-CM

## 2022-09-29 DIAGNOSIS — T83011A Breakdown (mechanical) of indwelling urethral catheter, initial encounter: Secondary | ICD-10-CM | POA: Diagnosis not present

## 2022-09-29 DIAGNOSIS — C679 Malignant neoplasm of bladder, unspecified: Secondary | ICD-10-CM | POA: Diagnosis not present

## 2022-09-29 DIAGNOSIS — E785 Hyperlipidemia, unspecified: Secondary | ICD-10-CM | POA: Diagnosis not present

## 2022-09-29 DIAGNOSIS — C662 Malignant neoplasm of left ureter: Secondary | ICD-10-CM

## 2022-09-29 HISTORY — PX: CYSTOSCOPY WITH STENT PLACEMENT: SHX5790

## 2022-09-29 HISTORY — PX: CYSTOSCOPY/RETROGRADE/URETEROSCOPY: SHX5316

## 2022-09-29 HISTORY — DX: Long term (current) use of aspirin: Z79.82

## 2022-09-29 HISTORY — DX: Other allergic rhinitis: J30.89

## 2022-09-29 SURGERY — CYSTOSCOPY, WITH STENT INSERTION
Anesthesia: General | Laterality: Left

## 2022-09-29 MED ORDER — FENTANYL CITRATE (PF) 100 MCG/2ML IJ SOLN
INTRAMUSCULAR | Status: DC | PRN
  Administered 2022-09-29: 50 ug via INTRAVENOUS

## 2022-09-29 MED ORDER — DEXAMETHASONE SODIUM PHOSPHATE 10 MG/ML IJ SOLN
INTRAMUSCULAR | Status: DC | PRN
  Administered 2022-09-29: 10 mg via INTRAVENOUS

## 2022-09-29 MED ORDER — SUGAMMADEX SODIUM 200 MG/2ML IV SOLN
INTRAVENOUS | Status: DC | PRN
  Administered 2022-09-29: 372 mg via INTRAVENOUS

## 2022-09-29 MED ORDER — ROCURONIUM BROMIDE 100 MG/10ML IV SOLN
INTRAVENOUS | Status: DC | PRN
  Administered 2022-09-29: 20 mg via INTRAVENOUS
  Administered 2022-09-29: 50 mg via INTRAVENOUS

## 2022-09-29 MED ORDER — MIDAZOLAM HCL 2 MG/2ML IJ SOLN
INTRAMUSCULAR | Status: AC
  Filled 2022-09-29: qty 2

## 2022-09-29 MED ORDER — LIDOCAINE HCL (PF) 2 % IJ SOLN
INTRAMUSCULAR | Status: AC
  Filled 2022-09-29: qty 5

## 2022-09-29 MED ORDER — DEXAMETHASONE SODIUM PHOSPHATE 10 MG/ML IJ SOLN
INTRAMUSCULAR | Status: AC
  Filled 2022-09-29: qty 1

## 2022-09-29 MED ORDER — GLYCOPYRROLATE 0.2 MG/ML IJ SOLN
INTRAMUSCULAR | Status: DC | PRN
Start: 2022-09-29 — End: 2022-09-29
  Administered 2022-09-29: .2 mg via INTRAVENOUS

## 2022-09-29 MED ORDER — PROPOFOL 1000 MG/100ML IV EMUL
INTRAVENOUS | Status: AC
  Filled 2022-09-29: qty 100

## 2022-09-29 MED ORDER — CHLORHEXIDINE GLUCONATE 0.12 % MT SOLN
OROMUCOSAL | Status: AC
  Filled 2022-09-29: qty 15

## 2022-09-29 MED ORDER — TAMSULOSIN HCL 0.4 MG PO CAPS: 0.4000 mg | ORAL_CAPSULE | Freq: Every day | ORAL | 0 refills | Status: AC

## 2022-09-29 MED ORDER — LIDOCAINE HCL (CARDIAC) PF 100 MG/5ML IV SOSY
PREFILLED_SYRINGE | INTRAVENOUS | Status: DC | PRN
  Administered 2022-09-29: 80 mg via INTRAVENOUS

## 2022-09-29 MED ORDER — PROPOFOL 10 MG/ML IV BOLUS
INTRAVENOUS | Status: DC | PRN
  Administered 2022-09-29: 150 mg via INTRAVENOUS
  Administered 2022-09-29: 100 ug/kg/min via INTRAVENOUS

## 2022-09-29 MED ORDER — OXYCODONE HCL 5 MG/5ML PO SOLN: 5.0000 mg | Freq: Once | ORAL | Status: AC | PRN

## 2022-09-29 MED ORDER — ONDANSETRON HCL 4 MG/2ML IJ SOLN
INTRAMUSCULAR | Status: DC | PRN
  Administered 2022-09-29: 4 mg via INTRAVENOUS

## 2022-09-29 MED ORDER — OXYCODONE HCL 5 MG PO TABS
ORAL_TABLET | ORAL | Status: AC
  Filled 2022-09-29: qty 1

## 2022-09-29 MED ORDER — SUCCINYLCHOLINE CHLORIDE 200 MG/10ML IV SOSY
PREFILLED_SYRINGE | INTRAVENOUS | Status: DC | PRN
Start: 2022-09-29 — End: 2022-09-29
  Administered 2022-09-29: 100 mg via INTRAVENOUS

## 2022-09-29 MED ORDER — MIDAZOLAM HCL 2 MG/2ML IJ SOLN
INTRAMUSCULAR | Status: DC | PRN
  Administered 2022-09-29: 2 mg via INTRAVENOUS

## 2022-09-29 MED ORDER — ACETAMINOPHEN 10 MG/ML IV SOLN
INTRAVENOUS | Status: AC
  Filled 2022-09-29: qty 100

## 2022-09-29 MED ORDER — ACETAMINOPHEN 10 MG/ML IV SOLN: INTRAVENOUS | Status: DC | PRN

## 2022-09-29 MED ORDER — FENTANYL CITRATE (PF) 100 MCG/2ML IJ SOLN
INTRAMUSCULAR | Status: AC
  Filled 2022-09-29: qty 2

## 2022-09-29 MED ORDER — FAMOTIDINE 20 MG PO TABS
20.0000 mg | ORAL_TABLET | Freq: Once | ORAL | Status: AC
  Administered 2022-09-29: 20 mg via ORAL

## 2022-09-29 MED ORDER — FAMOTIDINE 20 MG PO TABS
ORAL_TABLET | ORAL | Status: AC
  Filled 2022-09-29: qty 1

## 2022-09-29 MED ORDER — OXYBUTYNIN CHLORIDE 5 MG PO TABS: 5.0000 mg | ORAL_TABLET | Freq: Three times a day (TID) | ORAL | 0 refills | Status: AC | PRN

## 2022-09-29 MED ORDER — SODIUM CHLORIDE 0.9 % IR SOLN
Status: DC | PRN
  Administered 2022-09-29: 1500 mL
  Administered 2022-09-29: 3000 mL

## 2022-09-29 MED ORDER — FENTANYL CITRATE (PF) 100 MCG/2ML IJ SOLN: 25.0000 ug | INTRAMUSCULAR | Status: DC | PRN

## 2022-09-29 MED ORDER — CEFAZOLIN SODIUM-DEXTROSE 2-4 GM/100ML-% IV SOLN
INTRAVENOUS | Status: AC
  Filled 2022-09-29: qty 100

## 2022-09-29 MED ORDER — ROCURONIUM BROMIDE 10 MG/ML (PF) SYRINGE
PREFILLED_SYRINGE | INTRAVENOUS | Status: AC
  Filled 2022-09-29: qty 10

## 2022-09-29 MED ORDER — EPHEDRINE 5 MG/ML INJ
INTRAVENOUS | Status: AC
  Filled 2022-09-29: qty 5

## 2022-09-29 MED ORDER — IOHEXOL 180 MG/ML  SOLN
INTRAMUSCULAR | Status: DC | PRN
  Administered 2022-09-29: 10 mL

## 2022-09-29 MED ORDER — HYDROCODONE-ACETAMINOPHEN 5-325 MG PO TABS
1.0000 | ORAL_TABLET | Freq: Four times a day (QID) | ORAL | 0 refills | Status: AC | PRN
Start: 2022-09-29 — End: ?

## 2022-09-29 MED ORDER — ACETAMINOPHEN 10 MG/ML IV SOLN
INTRAVENOUS | Status: DC | PRN
  Administered 2022-09-29: 1000 mg via INTRAVENOUS

## 2022-09-29 MED ORDER — OXYCODONE HCL 5 MG PO TABS
5.0000 mg | ORAL_TABLET | Freq: Once | ORAL | Status: AC | PRN
  Administered 2022-09-29: 5 mg via ORAL

## 2022-09-29 MED ORDER — PROPOFOL 10 MG/ML IV BOLUS
INTRAVENOUS | Status: AC
  Filled 2022-09-29: qty 40

## 2022-09-29 MED ORDER — ONDANSETRON HCL 4 MG/2ML IJ SOLN
INTRAMUSCULAR | Status: AC
  Filled 2022-09-29: qty 2

## 2022-09-29 SURGICAL SUPPLY — 30 items
BAG DRAIN SIEMENS DORNER NS (MISCELLANEOUS) ×2 IMPLANT
BAG DRN NS LF (MISCELLANEOUS) ×2
BAG PRESSURE INF REUSE 3000 (BAG) ×2 IMPLANT
BASKET 3 PRONG GRASPER (BASKET) ×1 IMPLANT
BASKET ZERO TIP 1.9FR (BASKET) ×1 IMPLANT
BRUSH SCRUB EZ 4% CHG (MISCELLANEOUS) ×2 IMPLANT
BSKT STON RTRVL ZERO TP 1.9FR (BASKET) ×2
CATH URETL OPEN 5X70 (CATHETERS) ×2 IMPLANT
CNTNR URN SCR LID CUP LEK RST (MISCELLANEOUS) ×1 IMPLANT
CONT SPEC 4OZ STRL OR WHT (MISCELLANEOUS)
DRSG TELFA 3X4 N-ADH STERILE (GAUZE/BANDAGES/DRESSINGS) ×2 IMPLANT
FIBER LASER MOSES 200 DFL (Laser) ×1 IMPLANT
GLOVE BIO SURGEON STRL SZ 6.5 (GLOVE) ×2 IMPLANT
GOWN STRL REUS W/ TWL LRG LVL3 (GOWN DISPOSABLE) ×4 IMPLANT
GOWN STRL REUS W/TWL LRG LVL3 (GOWN DISPOSABLE) ×4
GUIDEWIRE GREEN .038 145CM (MISCELLANEOUS) ×2 IMPLANT
GUIDEWIRE STR DUAL SENSOR (WIRE) ×3 IMPLANT
IV NS IRRIG 3000ML ARTHROMATIC (IV SOLUTION) ×2 IMPLANT
KIT TURNOVER CYSTO (KITS) ×2 IMPLANT
NDL SAFETY ECLIP 18X1.5 (MISCELLANEOUS) ×2 IMPLANT
PACK CYSTO AR (MISCELLANEOUS) ×2 IMPLANT
SET CYSTO W/LG BORE CLAMP LF (SET/KITS/TRAYS/PACK) ×2 IMPLANT
STENT URET 6FRX24 CONTOUR (STENTS) IMPLANT
STENT URET 6FRX26 CONTOUR (STENTS) IMPLANT
STENT URO INLAY 6FRX24CM (STENTS) ×1 IMPLANT
SURGILUBE 2OZ TUBE FLIPTOP (MISCELLANEOUS) ×2 IMPLANT
SYR TOOMEY IRRIG 70ML (MISCELLANEOUS) ×2
SYRINGE TOOMEY IRRIG 70ML (MISCELLANEOUS) ×2 IMPLANT
WATER STERILE IRR 1000ML POUR (IV SOLUTION) ×2 IMPLANT
WATER STERILE IRR 3000ML UROMA (IV SOLUTION) ×1 IMPLANT
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
## 2022-09-29 NOTE — Discharge Instructions (Addendum)
You have a ureteral stent in place.  This is a tube that extends from your kidney to your bladder.  This may cause urinary bleeding, burning with urination, and urinary frequency.  Please call our office or present to the ED if you develop fevers >101 or pain which is not able to be controlled with oral pain medications.  You may be given either Flomax and/ or ditropan to help with bladder spasms and stent pain in addition to pain medications.    [facility] [address] [place], [place] [address] [phone] AMBULATORY SURGERY  DISCHARGE INSTRUCTIONS   The drugs that you were given will stay in your system until tomorrow so for the next 24 hours you should not:  Drive an automobile Make any legal decisions Drink any alcoholic beverage   You may resume regular meals tomorrow.  Today it is better to start with liquids and gradually work up to solid foods.  You may eat anything you prefer, but it is better to start with liquids, then soup and crackers, and gradually work up to solid foods.   Please notify your doctor immediately if you have any unusual bleeding, trouble breathing, redness and pain at the surgery site, drainage, fever, or pain not relieved by medication.    Additional Instructions:        Please contact your physician with any problems or Same Day Surgery at [phone], Monday through Friday 6 am to 4 pm, or [place] at [facility] number at [phone].

## 2022-09-29 NOTE — Op Note (Signed)
Date of procedure: 09/29/22  Preoperative diagnosis:  History of left ureteral carcinoma Retained ureteral stent on the left  Postoperative diagnosis:  Same as above Encrusted retained left ureteral stent  Procedure: Left ureteroscopy Laser lithotripsy and basketing of retained left ureteral stent Left retrograde pyelogram Left ureteral stent replacement Interpretation of fluoroscopy less than 30 minutes  Surgeon: Vanna Scotland, MD  Anesthesia: General  Complications: None  Intraoperative findings: Markedly encrusted stent extending all the way from the proximal curl down into the bladder.  The entirety of the case was spent lasering encrusted stone from the majority of the surface of the stent and ultimately removing the stent in pieces using triradiate basket.  Stent replaced with Bard Optima, plans for return for staged procedure.  EBL: Minimal  Specimens: None  Drains: 6 x 24 left ureteral stent, Bard Optima  Indication: [name] is a 72 y.o. patient with history of left distal ureteral carcinoma status post ablative therapy followed by induction BCG.  He returns today for diagnostic ureteroscopy, stent exchange and biopsies.  After reviewing the management options for treatment, he elected to proceed with the above surgical procedure(s). We have discussed the potential benefits and risks of the procedure, side effects of the proposed treatment, the likelihood of the patient achieving the goals of the procedure, and any potential problems that might occur during the procedure or recuperation. Informed consent has been obtained.  Description of procedure:  The patient was taken to the operating room and general anesthesia was induced.  The patient was placed in the dorsal lithotomy position, prepped and draped in the usual sterile fashion, and preoperative antibiotics were administered. A preoperative time-out was performed.   A 21 French cystoscope was advanced per  urethra into the bladder.  Notably, the entirety of the distal aspect of the stent was completely encrusted with significant layer of the stone encasing it circumferentially.  On fluoroscopy, encrustation of the ureteral stent could be seen all the way up to the level of the kidney as well.  I used the stent graspers to crunch of the stone of the distal coil enough so that I was able to grasp it and ultimately was able to deliver it to the level of the urethral meatus, in fact a few centimeters beyond this.  At this point it became stuck.  Scout imaging showed the proximal coil at the level just above the iliacs.  A sensor wire was advanced up to the level of the kidney which remained in place the entirety of the procedure as a safety wire.  I then advanced a 4.5 semirigid ureteroscope into the distal ureter at which time I began using a 200 m laser fiber to start to chisel the encrusted stone off of the surface of the stent working my way distal to proximal.  I also used a slurry of lubricant and [place] evaluated to try to mobilize the stent unsuccessfully.  Notably, there was never any extravasation of contrast throughout the entirety of the procedure.  Use the settings of 0.3 J and 80 Hz to try to dust the stone material in order to avoid significant fragments.  As I made my way more proximal, the ureter itself became more narrow difficult to navigate along the encrusted stone.  Ultimately, I did end up traumatizing the stent and ultimately cleaving it into 2 pieces.  At this point, I had freed up the distalmost aspect enough that I felt that this could be removed.  After several  attempts, I was able to ultimately grasp the stent using a triradiate basket and remove this portion of the stent.  Unfortunately, on scout imaging, the proximalmost coil was completely encrusted and encased in stone.  Ended up advancing a Super Stiff wire and then ultimately a digital flexible scope up to this portion of the stone and  painstakingly was ultimately able to clear enough stones such that this portion of the stent was removed in 2 pieces.  With all the epilation, the integrity of the mucosa did appear to be superficially compromised in multiple areas.  A final retrograde pyelogram again showed no contrast extravasation and outline the collecting system.  Due to concern for the amount of mucosal trauma, elected to not manipulate the ureter any further today.  The safety wire was backloaded over rigid cystoscope and a 6 x 24 French double-J ureteral stent, Bard Optima was deployed.  When the wire was removed, a coil was noted in the upper pole calyx as well as within the bladder.  The bladder was then drained.  The patient was then cleaned and dried, repositioned in the supine position, reversed of anesthesia, and taken to the PACU in stable condition.  Plan: Intraoperative findings were discussed in detail with his wife.  We will plan for staged second look procedure with biopsies, gemcitabine, etc. as planned today in about 6 weeks with the ureter has healed.  If there is any residual stone debris, that can be addressed as well.  Modifier 22 should be used for today's procedure.  Due to the amount of encrustation involving nearly the entirety circumferentially of the stent, the procedure was prolonged and technically challenging.  At least an additional 25% of time was spent on today's procedure above and beyond what was expected.  Vanna Scotland, M.D.

## 2022-09-29 NOTE — Anesthesia Procedure Notes (Signed)
Procedure Name: Intubation Date/Time: 09/29/2022 9:54 AM  Performed by: Lysbeth Penner, CRNAPre-anesthesia Checklist: Patient identified, Emergency Drugs available, Suction available and Patient being monitored Patient Re-evaluated:Patient Re-evaluated prior to induction Oxygen Delivery Method: Circle system utilized Preoxygenation: Pre-oxygenation with 100% oxygen Induction Type: IV induction Ventilation: Mask ventilation without difficulty Laryngoscope Size: McGraph and 4 Grade View: Grade I Tube type: Oral Tube size: 7.0 mm Number of attempts: 1 Airway Equipment and Method: Stylet and Oral airway Placement Confirmation: ETT inserted through vocal cords under direct vision, positive ETCO2 and breath sounds checked- equal and bilateral Secured at: 23 cm Tube secured with: Tape Dental Injury: Teeth and Oropharynx as per pre-operative assessment
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
## 2022-09-29 NOTE — Transfer of Care (Signed)
Immediate Anesthesia Transfer of Care Note  Patient: [name]  Procedure(s) Performed: CYSTOSCOPY WITH STENT REPLACEMENT (Left) CYSTOSCOPY/RETROGRADE/URETEROSCOPY (Left)  Patient Location: PACU  Anesthesia Type:General  Level of Consciousness: awake  Airway & Oxygen Therapy: Patient Spontanous Breathing  Post-op Assessment: Report given to RN and Post -op Vital signs reviewed and stable  Post vital signs: Reviewed and stable  Last Vitals:  Vitals Value Taken Time  BP 142/87 09/29/22 1155  Temp 35.9 C 09/29/22 1155  Pulse 48 09/29/22 1200  Resp 16 09/29/22 1200  SpO2 91 % 09/29/22 1200  Vitals shown include unfiled device data.  Last Pain:  Vitals:   09/29/22 0812  TempSrc: Oral         Complications: There were no known notable events for this encounter.

## 2022-09-29 NOTE — Anesthesia Postprocedure Evaluation (Signed)
Anesthesia Post Note  Patient: [name]  Procedure(s) Performed: CYSTOSCOPY WITH STENT REPLACEMENT (Left) CYSTOSCOPY/RETROGRADE/URETEROSCOPY (Left)  Patient location during evaluation: PACU Anesthesia Type: General Level of consciousness: awake and alert Pain management: pain level controlled Vital Signs Assessment: post-procedure vital signs reviewed and stable Respiratory status: spontaneous breathing, nonlabored ventilation, respiratory function stable and patient connected to nasal cannula oxygen Cardiovascular status: blood pressure returned to baseline and stable Postop Assessment: no apparent nausea or vomiting Anesthetic complications: no  There were no known notable events for this encounter.   Last Vitals:  Vitals:   09/29/22 1230 09/29/22 1245  BP:  (!) 151/84  Pulse: (!) 53 (!) 44  Resp: 20 17  Temp: (!) 36.3 C (!) 36.2 C  SpO2: 99% 97%    Last Pain:  Vitals:   09/29/22 1245  TempSrc:   PainSc: 2                  Stephanie Coup

## 2022-09-29 NOTE — Addendum Note (Signed)
Addendum  created 09/29/22 1303 by Lysbeth Penner, CRNA   Flowsheet accepted

## 2022-10-06 ENCOUNTER — Telehealth: Payer: Self-pay | Admitting: *Deleted

## 2022-10-06 DIAGNOSIS — C662 Malignant neoplasm of left ureter: Secondary | ICD-10-CM

## 2022-10-06 NOTE — Telephone Encounter (Signed)
Patient advised. Patient decided to wait on the procedure until he gets second opinion. Aware of the risk of stent encrustation. Referral placed

## 2022-10-06 NOTE — Addendum Note (Signed)
Addended by: Consuella Lose on: 10/06/2022 03:53 PM   Modules accepted: Orders

## 2022-10-06 NOTE — Telephone Encounter (Signed)
I am fine with a second opinion, okay to place referral.  We had planned to go back in about 5 weeks from now for another look and biopsies which we were not able to do because of severe stent encrustation.    Does he want [place] to go ahead and move forward with this; would he like to come in and discuss it further; or would he like to delay this procedure until he has his second opinion?  My only concern is that if we wait too long, the stent could encrusted again.  Vanna Scotland, MD

## 2022-10-06 NOTE — Telephone Encounter (Signed)
Patient called and states he wants to be referral for a second option at [facility].

## 2022-10-24 ENCOUNTER — Telehealth: Payer: Self-pay

## 2022-10-24 NOTE — Telephone Encounter (Signed)
Phone note started in error. Please disregard.

## 2022-11-04 ENCOUNTER — Other Ambulatory Visit: Payer: Medicare HMO | Admitting: Urology

## 2022-11-12 ENCOUNTER — Other Ambulatory Visit: Payer: Self-pay | Admitting: Neurology

## 2022-11-12 DIAGNOSIS — R4189 Other symptoms and signs involving cognitive functions and awareness: Secondary | ICD-10-CM

## 2022-11-17 ENCOUNTER — Ambulatory Visit: Payer: Medicare Other | Attending: Neurology

## 2022-11-17 DIAGNOSIS — R4189 Other symptoms and signs involving cognitive functions and awareness: Secondary | ICD-10-CM | POA: Insufficient documentation

## 2022-11-17 DIAGNOSIS — R4789 Other speech disturbances: Secondary | ICD-10-CM | POA: Insufficient documentation

## 2022-11-17 NOTE — Therapy (Signed)
OUTPATIENT SPEECH LANGUAGE PATHOLOGY  EVALUATION   Patient Name: [name] MRN: [id number] DOB:[birth year], 72 y.o., male Today's Date: 11/17/2022  PCP: Maudie Flakes, MD  REFERRING PROVIDER: Cristopher Peru, MD   End of Session - 11/17/22 1103     Visit Number 1    Number of Visits 8    Date for SLP Re-Evaluation 01/12/23    SLP Start Time 0920    SLP Stop Time  1015    SLP Time Calculation (min) 55 min    Activity Tolerance Patient tolerated treatment well              Patient Active Problem List   Diagnosis Date Noted   Syncope 12/30/2016   Sleep apnea 11/08/2015   S/P ablation of atrial flutter 11/30/2013   A-fib (HCC) 11/19/2012   CAD (coronary artery disease) 11/19/2012   Atrial flutter (HCC) 11/18/2012   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 Pt accompanied by: self  PERTINENT HISTORY: . 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 There were two blood clots in his lungs due to aspirating during surgery. oved one has not noticed any facial drooping, but there has been changes in his speech (sounds mumbled). Loved one expressed that after patients surgery, he spent three days in intensive care due septicemia and septic shock blood pressure 70/50."  DIAGNOSTIC FINDINGS: MRI brain, 01/17/22, "No evidence of intracranial abnormality..." Further workup from Neuro pending  PAIN:  Are you  having pain? No   FALLS: Has patient fallen in last 6 months?  No  LIVING ENVIRONMENT: Lives with: lives with their family Lives in: House/apartment  PLOF:  Level of assistance: Independent with ADLs Employment: Retired   PATIENT GOALS   to think clearer OBJECTIVE:   COGNITIVE COMMUNICATION: Overall cognitive status: Impaired Areas of impairment:  Attention: Impaired: Alternating Auditory comprehension: WFL Verbal expression: WFL Functional communication: WFL Functional deficits: reports losing track of thoughts  AUDITORY COMPREHENSION: Overall auditory comprehension: Appears intact    READING COMPREHENSION: Intact  EXPRESSION: verbal  VERBAL EXPRESSION: WFL  WRITTEN EXPRESSION: Dominant hand: right   Written expression: Appears intact   Essential tremor affecting legibility  MOTOR SPEECH: Overall motor speech: Appears intact  ORAL MOTOR EXAMINATION: No obvious deficits  STANDARDIZED ASSESSMENTS:  Addenbrooke's Cognitive Examination - ACE III The Addenbrooke's Cognitive Examination-III (ACE-III) is a brief cognitive test that assesses five cognitive domains. The total score is 100 with higher scores indicating better cognitive functioning. Cut off scores of 88 and 82 are recommended for suspicion of dementia (88 has sensitivity of 1.00 and specificity of 0.96, 82 has sensitivity of 0.93 and specificity of 1.00). American Version C  Attention 18/18  Memory 25/26  Fluency 13/14  Language 26/26  Visuospatial 16/16  TOTAL ACE- III Score 98/100   TRAIL MAKING TEST (TMT) Parts A & B Both parts of the Trail Making Test consist of 25 circles distributed over a sheet of paper. In Part A, the circles are numbered  1 - 25, and the patient should draw lines to connect the numbers in ascending order. In Part B, the circles include both numbers (1 - 13) and letters (A - L); as in Part A, the patient draws lines to connect the circles in an ascending pattern, but with  the added task of alternating between the numbers and letters (i.e., 1-A-2-B-3-C, etc.).   Results for both TMT A and B are reported as the number of seconds required to complete the task; therefore, higher scores reveal greater impairment.  Results:  Trail A - WNL - 28 seconds (n=29 seconds; Deficient > 78 seconds) Trail B - WNL - 95 seconds (n= 75 seconds; Deficient > 273 seconds)  "TMTs have been shown to be significantly correlated with impaired driving on road tests by older drivers." "TMT-A provided the best utility for determining a range of scores (68-90 sec) for which additional road testing would be indicated in general practice settings."(Papandonatos GD, Delphina Cahill, [address] [place] PP, [place] DB. Clinical Utility of the Trail-Making Test as a Predictor of Driving Performance in Older Adults. J Am Geriatr Soc. 2015 Nov;63(11):2358-64. doi: 10.1111/jgs.13776. Epub 2015 Oct 27. PMID: 01027253; PMCID: [id number].)   PATIENT REPORTED OUTCOME MEASURES (PROM):  MULTIFACTORIAL MEMORY QUESTIONNAIRE (MMQ)  Administered patient self-reported outcome measure Multifactorial Memory Questionnaire (MMQ). The Multifactorial Memory Questionnaire [facility]) consists of three scales measuring separate aspects of metamemory; Satisfaction, Ability and Strategy.   Pt's responses are converted to T-Scores with severity levels based on pt's T-Score.   Severity Levels (T-score) Very Low - < 20 Low - 20 to 29 Below Average - 30-39 Average - 40 to 60 Above Average - 60 to 70 High - 71 to 80 Very High - > 80  Pt reports:  Average - 40 to 60 Memory Satisfaction (T-score: 55) Above Average - 60 to 70 Memory Ability (T-score: 67) Below Average - 30-39 use of Memory Strategies (T-score: 45)      TODAY'S TREATMENT:  Pt educated re: role of SLP, rationale for assessment, results of assessment, changes to cognition, personal factors that may be affecting cognition, and SLP POC. Pt verbalized  understanding/agreement.   PATIENT EDUCATION: Education details: as above Person educated: Patient Education method: Explanation Education comprehension: verbalized understanding; reinforcement of content needed  HOME EXERCISE PROGRAM:        To be given in upcoming sessions    GOALS:  Goals reviewed with patient? Yes  SHORT TERM GOALS: Target date: 10 sessions  Pt will ID x3 attention strategies or memory strategies to implement in home/out in the community.  Baseline: Goal status: INITIAL   2.  Pt will ID x3 ways to promote cognition.  Baseline:  Goal status: INITIAL    LONG TERM GOALS: Target date: 8 weeks  Pt will report implementation of 1-2 strategies to improve functional attention/memory.  Baseline:  Goal status: INITIAL  ASSESSMENT:  CLINICAL IMPRESSION:    Patient is a 72 y.o. male who was seen today for cognitive-communication evaluation in setting of brain fog and cognitive impairment. Assessment completed via formal assessment (ACE-III), Trail Making Tests (A&B), and PROM (MMQ). Pt scored WFL on all subtests of ACE-III and TMT. However, reduced alternating/divided attention noted on TMT. Per PROM, pt with average to above average Memory Satisfaction and Memory Ability compared to other adults; however, pt with low use of compensations for memory. Pt reports difficulty collecting thoughts and carrying out tasks until completeness since hospitalization in September. Recommend brief course of therapy to  address functional cognitive-linguistic ability in the aging brain including education re: normal aging vs mild cognitive impairment and compensations for attention and memory.   OBJECTIVE IMPAIRMENTS include attention. These impairments are limiting patient from household responsibilities and ADLs/IADLs. Factors affecting potential to achieve goals and functional outcome are co-morbidities.. Patient will benefit from skilled SLP services to address above  impairments and improve overall function.  REHAB POTENTIAL: Good  PLAN: SLP FREQUENCY: 1x/week  SLP DURATION: 8 weeks  PLANNED INTERVENTIONS: Re-evaluation, Functional tasks, SLP instruction and feedback, Compensatory strategies, and Patient/family education    Clyde Canterbury, M.S., CCC-SLP Speech-Language Pathologist  [facility] [address] [place], [place], [address] Phone: [phone]   Fax:  [phone]

## 2022-11-24 ENCOUNTER — Ambulatory Visit: Payer: Medicare Other

## 2022-11-24 DIAGNOSIS — R4189 Other symptoms and signs involving cognitive functions and awareness: Secondary | ICD-10-CM

## 2022-11-24 NOTE — Therapy (Signed)
OUTPATIENT SPEECH LANGUAGE PATHOLOGY  EVALUATION   Patient Name: [name] MRN: [id number] DOB:December 07, 1950, 72 y.o., male Today's Date: 11/24/2022  PCP: Maudie Flakes, MD  REFERRING PROVIDER: Cristopher Peru, MD   End of Session - 11/24/22 1156     Visit Number 2    Number of Visits 8    Date for SLP Re-Evaluation 01/12/23    SLP Start Time 1105    SLP Stop Time  1150    SLP Time Calculation (min) 45 min    Activity Tolerance Patient tolerated treatment well              Patient Active Problem List   Diagnosis Date Noted   Syncope 12/30/2016   Sleep apnea 11/08/2015   S/P ablation of atrial flutter 11/30/2013   A-fib (HCC) 11/19/2012   CAD (coronary artery disease) 11/19/2012   Atrial flutter (HCC) 11/18/2012   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 Pt accompanied by: self  PERTINENT HISTORY: . 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 There were two blood clots in his lungs due to aspirating during surgery. oved one has not noticed any facial drooping, but there has been changes in his speech (sounds mumbled). Loved one expressed that after patients surgery, he spent three days in intensive care due septicemia and septic shock blood pressure 70/50."  DIAGNOSTIC FINDINGS: MRI brain, 01/17/22, "No evidence of intracranial abnormality..." Further workup from Neuro pending  PAIN:  Are you  having pain? No   FALLS: Has patient fallen in last 6 months?  No  LIVING ENVIRONMENT: Lives with: lives with their family Lives in: House/apartment  PLOF:  Level of assistance: Independent with ADLs Employment: Retired   PATIENT GOALS   to think clearer OBJECTIVE:   TODAY'S TREATMENT:  Pt participated in an in depth discussion regarding ways to promote overall cognitive health. Pt able to ID several specific examples of implementation in current day-to-day as well as x3 additional things he can do to improve overall cognitive health. Emphasis placed on management of chronic and current medical problems, physical activity, social engagement, and mental stimulation.    PATIENT EDUCATION: Education details: as above Person educated: Patient Education method: Explanation; handout Education comprehension: verbalized understanding; reinforcement of content needed  HOME EXERCISE PROGRAM:        Continue / begin implementation of cognitive health strategies    GOALS:  Goals reviewed with patient? Yes  SHORT TERM GOALS: Target date: 10 sessions  Pt will ID x3 attention strategies or memory strategies to implement in home/out in the community.  Baseline: Goal status: INITIAL   2.  Pt will ID x3 ways to promote cognition.  Baseline:  Goal status: INITIAL    LONG TERM GOALS: Target date: 8 weeks  Pt will report implementation of 1-2 strategies to improve functional attention/memory.  Baseline:  Goal status: INITIAL  ASSESSMENT:  CLINICAL IMPRESSION:    Patient is a 72 y.o. male who was seen today for cognitive-communication  treatment in setting of brain fog and cognitive impairment. Assessment completed via formal assessment (ACE-III), Trail Making Tests (A&B), and PROM (MMQ). Pt scored WFL on all subtests of ACE-III and TMT. However, reduced alternating/divided attention noted on TMT. Per PROM, pt with average to above average Memory Satisfaction and Memory Ability  compared to other adults; however, pt with low use of compensations for memory. Pt reports difficulty collecting thoughts and carrying out tasks until completeness since hospitalization in September. See tx note for details of session. Recommend brief course of therapy to address functional cognitive-linguistic ability in the aging brain including education re: normal aging vs mild cognitive impairment and compensations for attention and memory.   OBJECTIVE IMPAIRMENTS include attention. These impairments are limiting patient from household responsibilities and ADLs/IADLs. Factors affecting potential to achieve goals and functional outcome are co-morbidities.. Patient will benefit from skilled SLP services to address above impairments and improve overall function.  REHAB POTENTIAL: Good  PLAN: SLP FREQUENCY: 1x/week  SLP DURATION: 8 weeks  PLANNED INTERVENTIONS: Re-evaluation, Functional tasks, SLP instruction and feedback, Compensatory strategies, and Patient/family education    Clyde Canterbury, M.S., CCC-SLP Speech-Language Pathologist  [facility] at [facility] [address] [place], [place], [address] Phone: [phone]   Fax:  [phone]

## 2022-12-02 ENCOUNTER — Ambulatory Visit: Payer: Medicare Other

## 2022-12-02 DIAGNOSIS — R4189 Other symptoms and signs involving cognitive functions and awareness: Secondary | ICD-10-CM | POA: Diagnosis not present

## 2022-12-02 NOTE — Therapy (Addendum)
OUTPATIENT SPEECH LANGUAGE PATHOLOGY  EVALUATION   Patient Name: [name] MRN: [id number] DOB:Sep 01, 1950, 72 y.o., male Today's Date: 12/02/2022  PCP: Maudie Flakes, MD  REFERRING PROVIDER: Cristopher Peru, MD   End of Session - 12/02/22 1231     Visit Number 3    Number of Visits 8    Date for SLP Re-Evaluation 01/12/23    SLP Start Time 1145    SLP Stop Time  1230    SLP Time Calculation (min) 45 min    Activity Tolerance Patient tolerated treatment well              Patient Active Problem List   Diagnosis Date Noted   Syncope 12/30/2016   Sleep apnea 11/08/2015   S/P ablation of atrial flutter 11/30/2013   A-fib (HCC) 11/19/2012   CAD (coronary artery disease) 11/19/2012   Atrial flutter (HCC) 11/18/2012   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 Pt accompanied by: self  PERTINENT HISTORY: . 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 There were two blood clots in his lungs due to aspirating during surgery. oved one has not noticed any facial drooping, but there has been changes in his speech (sounds mumbled). Loved one expressed that after patients surgery, he spent three days in intensive care due septicemia and septic shock blood pressure 70/50."  DIAGNOSTIC FINDINGS: MRI brain, 01/17/22, "No evidence of intracranial abnormality..." Further workup from Neuro pending  PAIN:  Are you  having pain? No   FALLS: Has patient fallen in last 6 months?  No  LIVING ENVIRONMENT: Lives with: lives with their family Lives in: House/apartment  PLOF:  Level of assistance: Independent with ADLs Employment: Retired   PATIENT GOALS   to think clearer OBJECTIVE:   TODAY'S TREATMENT:  Pt participated in an in depth discussion regarding ways to promote attention/memory. Pt able to ID several specific examples of implementation in current day-to-day as well as x2 additional action items. Pt with subjective improvement in brain fog as overall health has improved. Pt did not identify any additional areas of concern.   PATIENT EDUCATION: Education details: as above Person educated: Patient Education method: Explanation; handout Education comprehension: verbalized understanding  HOME EXERCISE PROGRAM:        Implementation of memory / attention strategies    GOALS:  Goals reviewed with patient? Yes  SHORT TERM GOALS: Target date: 10 sessions  Pt will ID x3 attention strategies or memory strategies to implement in home/out in the community.  Baseline: Goal status: INITIAL   2.  Pt will ID x3 ways to promote cognition.  Baseline:  Goal status: INITIAL    LONG TERM GOALS: Target date: 8 weeks  Pt will report implementation of 1-2 strategies to improve functional attention/memory.  Baseline:  Goal status: INITIAL  ASSESSMENT:  CLINICAL IMPRESSION:    Patient is a 72 y.o. male who was seen today for cognitive-communication treatment in setting of brain fog and cognitive impairment. Assessment completed via  formal assessment (ACE-III), Trail Making Tests (A&B), and PROM (MMQ). Pt scored WFL on all subtests of ACE-III and TMT. However, reduced alternating/divided attention noted on TMT. Per PROM, pt with average to above average Memory Satisfaction and Memory Ability compared to other adults; however, pt with low use of compensations for memory. Pt reports difficulty  collecting thoughts and carrying out tasks until completeness since hospitalization in September. See tx note for details of session. Recommend brief course of therapy to address functional cognitive-linguistic ability in the aging brain including education re: normal aging vs mild cognitive impairment and compensations for attention and memory.   OBJECTIVE IMPAIRMENTS include attention. These impairments are limiting patient from household responsibilities and ADLs/IADLs. Factors affecting potential to achieve goals and functional outcome are co-morbidities.. Patient will benefit from skilled SLP services to address above impairments and improve overall function.  REHAB POTENTIAL: Good  PLAN: SLP FREQUENCY: 1x/week  SLP DURATION: 8 weeks  PLANNED INTERVENTIONS: Re-evaluation, Functional tasks, SLP instruction and feedback, Compensatory strategies, and Patient/family education    Clyde Canterbury, M.S., CCC-SLP Speech-Language Pathologist  [facility] at [facility] [address] [place], [place], [address] Phone: [phone]   Fax:  [phone]

## 2022-12-08 ENCOUNTER — Ambulatory Visit: Payer: Medicare Other

## 2022-12-08 DIAGNOSIS — R4189 Other symptoms and signs involving cognitive functions and awareness: Secondary | ICD-10-CM | POA: Diagnosis not present

## 2022-12-08 NOTE — Therapy (Signed)
OUTPATIENT SPEECH LANGUAGE PATHOLOGY  DISCHARGE NOTE   Patient Name: [name] MRN: [id number] DOB:12/21/1950, 72 y.o., male Today's Date: 12/08/2022  PCP: Maudie Flakes, MD  REFERRING PROVIDER: Cristopher Peru, MD   End of Session - 12/08/22 1014     Visit Number 4    Number of Visits 8    Date for SLP Re-Evaluation 01/12/23    SLP Start Time 0934    SLP Stop Time  1012    SLP Time Calculation (min) 38 min    Activity Tolerance Patient tolerated treatment well              Patient Active Problem List   Diagnosis Date Noted   Syncope 12/30/2016   Sleep apnea 11/08/2015   S/P ablation of atrial flutter 11/30/2013   A-fib (HCC) 11/19/2012   CAD (coronary artery disease) 11/19/2012   Atrial flutter (HCC) 11/18/2012   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 Pt accompanied by: self  PERTINENT HISTORY: . 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 There were two blood clots in his lungs due to aspirating during surgery. oved one has not noticed any facial drooping, but there has been changes in his speech (sounds mumbled). Loved one expressed that after patients surgery, he spent three days in intensive care due septicemia and septic shock blood pressure 70/50."  DIAGNOSTIC FINDINGS: MRI brain, 01/17/22, "No evidence of intracranial abnormality..." Further workup from Neuro pending  PAIN:  Are  you having pain? No   FALLS: Has patient fallen in last 6 months?  No  LIVING ENVIRONMENT: Lives with: lives with their family Lives in: House/apartment  PLOF:  Level of assistance: Independent with ADLs Employment: Retired   PATIENT GOALS   to think clearer OBJECTIVE:   TODAY'S TREATMENT:  Reviewed attention, memory, and general cognitive promoting strategies. Pt reported successful implementation of >3 strategies. Pt with subjective improvement in brain fog as overall health has improved. Pt did not identify any additional areas of concern.   PATIENT EDUCATION: Education details: as above Person educated: Patient Education method: Explanation; handout Education comprehension: verbalized understanding  HOME EXERCISE PROGRAM:        N/A    GOALS:  Goals reviewed with patient? Yes  SHORT TERM GOALS: Target date: 10 sessions  Pt will ID x3 attention strategies or memory strategies to implement in home/out in the community.  Baseline: Goal status: MET   2.  Pt will ID x3 ways to promote cognition.  Baseline:  Goal status: MET    LONG TERM GOALS: Target date: 8 weeks  Pt will report implementation of 1-2 strategies to improve functional attention/memory.  Baseline:  Goal status: MET  ASSESSMENT:  CLINICAL IMPRESSION:    Patient is a 72 y.o. male who was seen today for cognitive-communication treatment in setting of brain fog and cognitive impairment. Pt has been very motivated to improve cognitive-linguistic functioning with reported successful implementation of >3 strategies to improve attention, memory, and overall cognitive  functioning. Pt has reported improved cognitive-linguistic functioning and has not identified any other areas of concern. Pt has met all goals. Pt is now d/c'd from SLP services.    PLAN: D/C from SLP services    Clyde Canterbury, M.S., CCC-SLP Speech-Language Pathologist  [facility] at  [facility] [address] [place], [place], [address] Phone: [phone]   Fax:  [phone]

## 2022-12-09 ENCOUNTER — Ambulatory Visit: Payer: Medicare Other

## 2022-12-12 ENCOUNTER — Ambulatory Visit
Admission: RE | Admit: 2022-12-12 | Discharge: 2022-12-12 | Disposition: A | Discharge status: Home / Self Care | Payer: Medicare Other | Source: Ambulatory Visit | Attending: Neurology | Admitting: Neurology

## 2022-12-12 DIAGNOSIS — R4189 Other symptoms and signs involving cognitive functions and awareness: Secondary | ICD-10-CM | POA: Diagnosis present

## 2022-12-12 MED ORDER — GADOBUTROL 1 MMOL/ML IV SOLN
9.0000 mL | Freq: Once | INTRAVENOUS | Status: AC | PRN
Start: 2022-12-12 — End: 2022-12-12
  Administered 2022-12-12: 9 mL via INTRAVENOUS

## 2022-12-31 ENCOUNTER — Encounter: Payer: Self-pay | Admitting: Surgery

## 2022-12-31 NOTE — Progress Notes (Signed)
[facility] Department was following patient for possible management of tuberculosis treatment.   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 Urine was sent off during an admission for fever of unknown origin after complications from a urethreal stent exchange in 10/17/2022. Patient had other concerning symptoms including, fatigue, loss of appetite, cough.   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  10/4 CXR was clear  10/20/2022 Chest CT showed: LUNGS, AIRWAYS, AND PLEURA: Diffusely scattered centrilobular groundglass nodules bilaterally. Multiple peripheral wedge-shaped opacities with central clearing in the right lung base with adjacent ill-defined groundglass opacities. Central airways are patent. Small pleural effusions. No pneumothorax.   MEDIASTINUM AND LYMPH NODES: No enlarged intrathoracic, axillary, or supraclavicular lymph nodes. Small sliding type hiatal hernia.  10/7 [phone] AFB Blood culture negative 10/21/22 [phone] AND [phone] - AFB Blood cultures both AFB negative 10/21/22 Urine- [id number]- Indeterminate smear; Culture MTB+ 10/21/22 Urine- [id number]- MTB by MALDI-TOF, sensitivities pending; specimen sent to [place]. Bovis is part of MTB complex; tested + in urine for 10/ 10/23/2022 Urine- [id number]- AFB+ 1+ and grew MAC  Patient was treated empirically with doxycycline for symptoms in 10/2022, but urine tested positive for AFB on smear and cultures returned with MAC and MTB in two different specimens.   12/19/2022 Per ID follow up at [facility] with Dr. 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 Follow up CT chest was ordered and results are as below:  CT Chest 12/25/2022- LUNGS, AIRWAYS, AND PLEURA: Interval resolution of previously noted  centrilobular nodules and areas of bibasilar consolidation. Minimal residual scarring right base. Stable subpleural micronodule in the posterior right lower lobe (3:2056). Central airways are patent. No pleural effusion or pneumothorax. MEDIASTINUM AND LYMPH NODES: No enlarged intrathoracic, axillary, or supraclavicular lymph nodes.  At 12/19/2022 appointment, no plan to treat for M. bovis, +MTB results attributed to BCG infusion. On 12/6 ID was awaiting results of patient CT scan of the chest to determine whether or not further intervention was necessary. Patient's 12/25/2022 CT scan showed resolution of previous findings.   No immediate concern for MTB requiring treatment by the health department. Patient's pulmonary findings have resolved per follow up CT and his ID specialist at [facility] does not recommend any treatment for M. bovis, which is the likely cause of his +MTB culture after multiple BCG bladder infusions, the last one being in June 2024. The [facility] Department will sign off on patient. Please contact TB Coordinator at [phone]  if new findings arise.   Jennye Moccasin, MD

## 2023-05-24 ENCOUNTER — Other Ambulatory Visit: Payer: Self-pay | Admitting: Urology

## 2023-05-28 ENCOUNTER — Other Ambulatory Visit: Payer: Self-pay | Admitting: Urology

## 2023-06-15 ENCOUNTER — Ambulatory Visit: Admit: 2023-06-15

## 2023-06-15 SURGERY — COLONOSCOPY
Anesthesia: General

## 2023-06-29 ENCOUNTER — Ambulatory Visit: Admit: 2023-06-29

## 2023-06-29 SURGERY — COLONOSCOPY
Anesthesia: General

## 2023-12-03 ENCOUNTER — Other Ambulatory Visit: Payer: Self-pay | Admitting: Medical Genetics
